# Patient Record
Sex: Female | Born: 1970 | Race: White | Hispanic: No | Marital: Married | Smoking: Never smoker
Health system: Southern US, Community
[De-identification: ages and names within clinical notes are randomized; demographics above are authoritative.]

## PROBLEM LIST (undated history)

## (undated) DIAGNOSIS — R519 Headache, unspecified: Secondary | ICD-10-CM

## (undated) DIAGNOSIS — Z8719 Personal history of other diseases of the digestive system: Secondary | ICD-10-CM

## (undated) DIAGNOSIS — G4730 Sleep apnea, unspecified: Secondary | ICD-10-CM

## (undated) DIAGNOSIS — K5792 Diverticulitis of intestine, part unspecified, without perforation or abscess without bleeding: Secondary | ICD-10-CM

## (undated) DIAGNOSIS — I471 Supraventricular tachycardia, unspecified: Secondary | ICD-10-CM

## (undated) DIAGNOSIS — K589 Irritable bowel syndrome without diarrhea: Secondary | ICD-10-CM

## (undated) DIAGNOSIS — K219 Gastro-esophageal reflux disease without esophagitis: Secondary | ICD-10-CM

## (undated) DIAGNOSIS — R51 Headache: Secondary | ICD-10-CM

## (undated) DIAGNOSIS — I10 Essential (primary) hypertension: Secondary | ICD-10-CM

## (undated) DIAGNOSIS — I209 Angina pectoris, unspecified: Secondary | ICD-10-CM

## (undated) DIAGNOSIS — C801 Malignant (primary) neoplasm, unspecified: Secondary | ICD-10-CM

## (undated) HISTORY — PX: TUBAL LIGATION: SHX77

## (undated) HISTORY — PX: ABDOMINAL HYSTERECTOMY: SHX81

## (undated) HISTORY — PX: SIGMOID RESECTION / RECTOPEXY: SUR1294

## (undated) HISTORY — PX: LAPAROSCOPIC LYSIS OF ADHESIONS: SHX5905

## (undated) HISTORY — PX: APPENDECTOMY: SHX54

## (undated) HISTORY — PX: SALPINGOOPHORECTOMY: SHX82

---

## 2004-03-31 ENCOUNTER — Ambulatory Visit: Payer: Self-pay | Admitting: Unknown Physician Specialty

## 2004-05-02 ENCOUNTER — Ambulatory Visit: Payer: Self-pay | Admitting: Obstetrics and Gynecology

## 2005-01-11 ENCOUNTER — Ambulatory Visit: Payer: Self-pay | Admitting: Obstetrics and Gynecology

## 2005-02-10 ENCOUNTER — Ambulatory Visit: Payer: Self-pay | Admitting: Psychiatry

## 2005-02-21 ENCOUNTER — Ambulatory Visit: Payer: Self-pay | Admitting: Internal Medicine

## 2005-02-21 IMAGING — CT CT ABD-PELV W/O CM
1 of 2 series · 16 of 32 positions shown, 20 images · non-contrast
Comparison: none

REASON FOR EXAM: Abdominal pain, evaluate for stones
COMMENTS:

[Series 2: stone · axial · 0.67mm/px · z∈[+154,+526]mm · 16 of 139 slices shown, 20 images]
[im 10/139  soft-tissue]
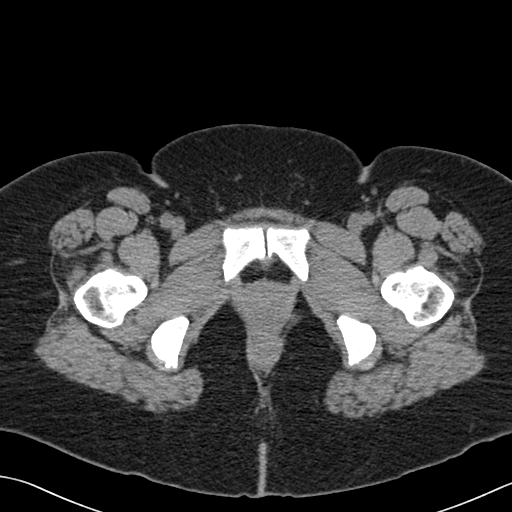
[im 10/139  bone]
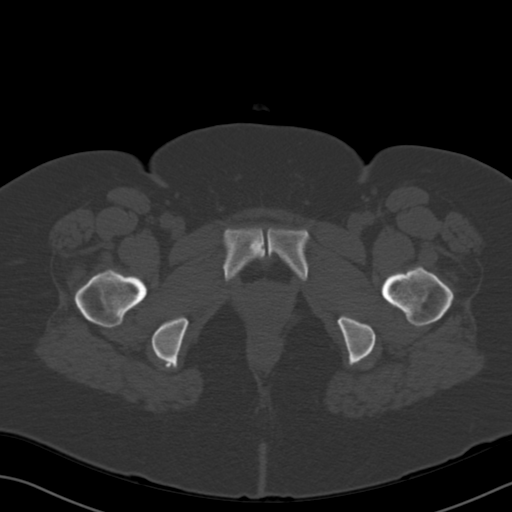
[im 20/139  soft-tissue]
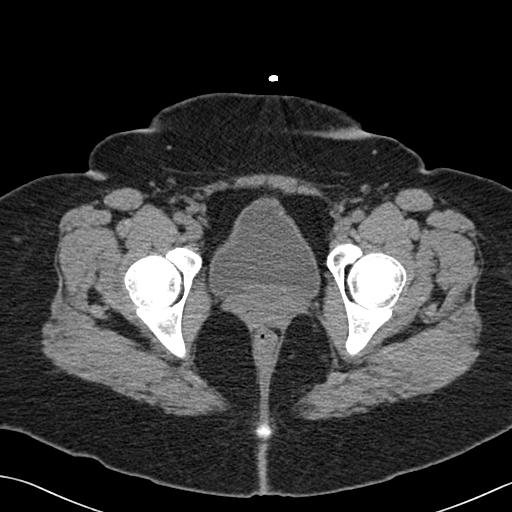
[im 29/139  soft-tissue]
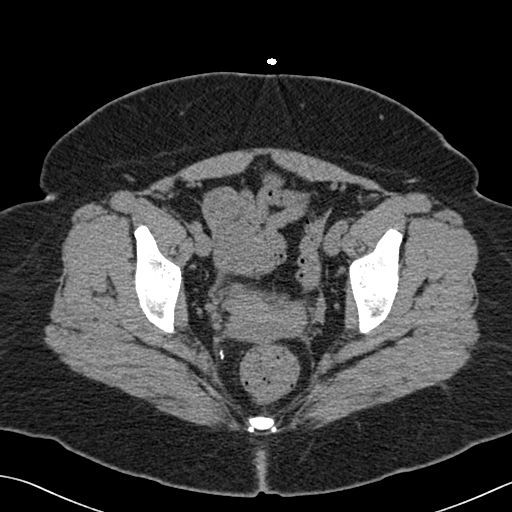
[im 39/139  soft-tissue]
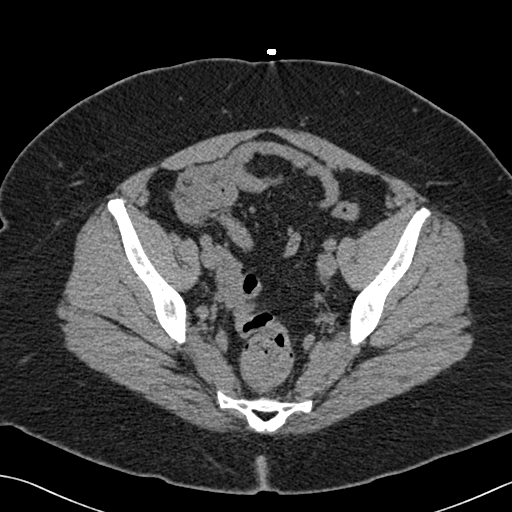
[im 48/139  soft-tissue]
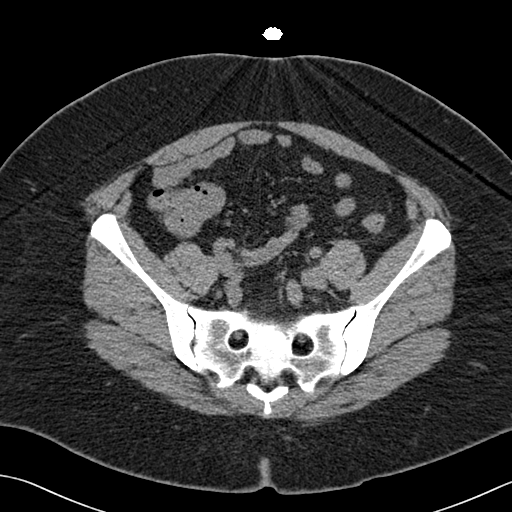
[im 58/139  soft-tissue]
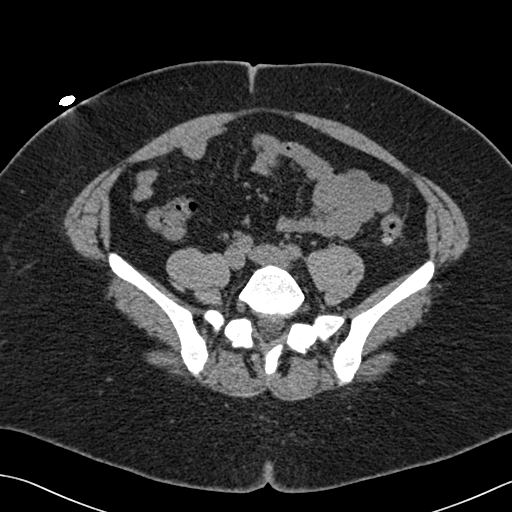
[im 67/139  soft-tissue]
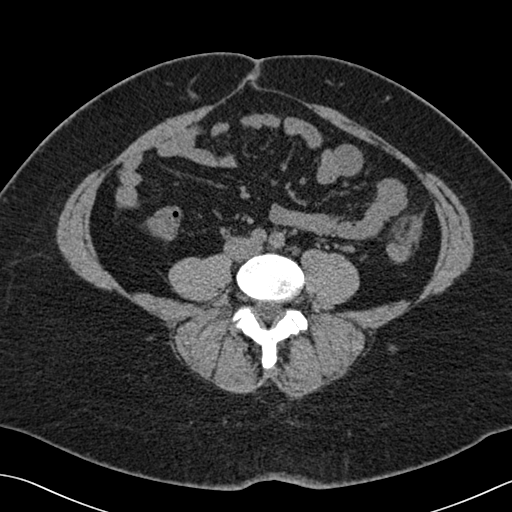
[im 77/139  soft-tissue]
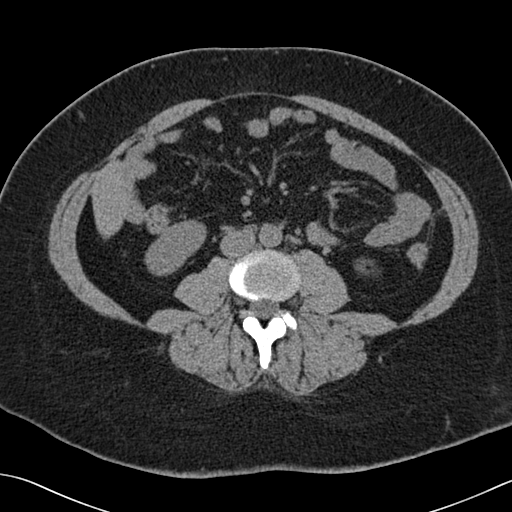
[im 86/139  soft-tissue]
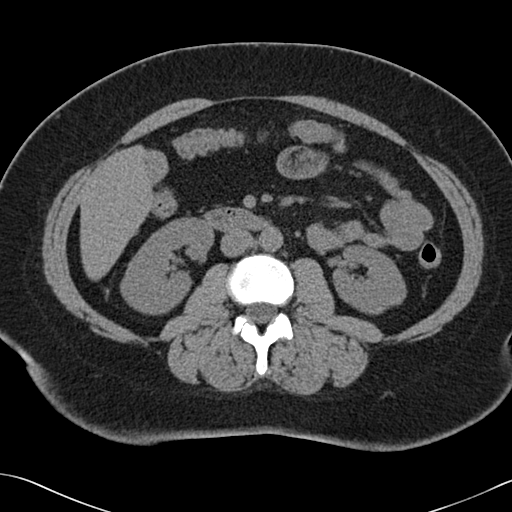
[im 86/139  bone]
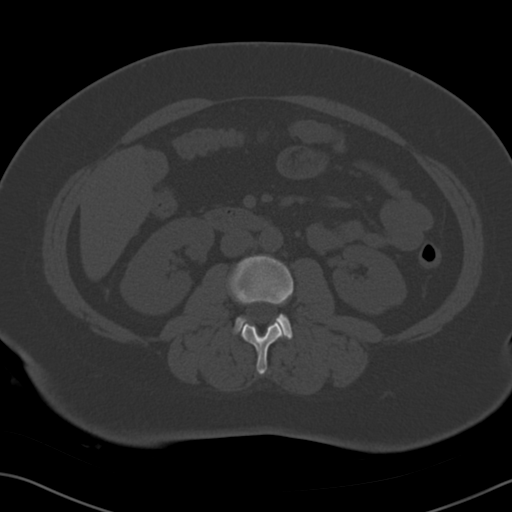
[im 96/139  soft-tissue]
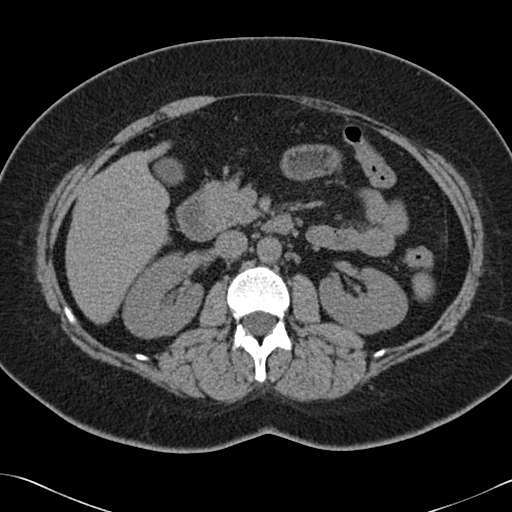
[im 105/139  soft-tissue]
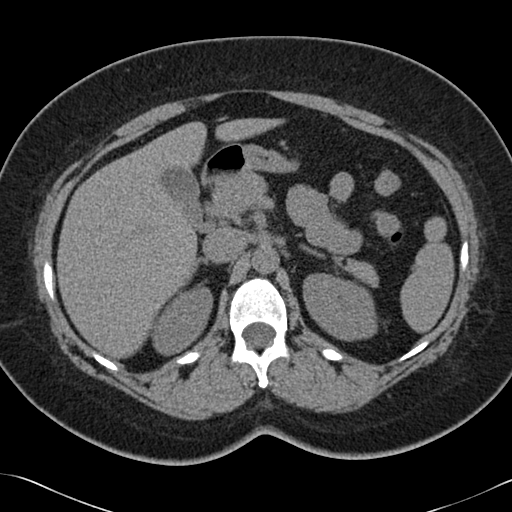
[im 115/139  soft-tissue]
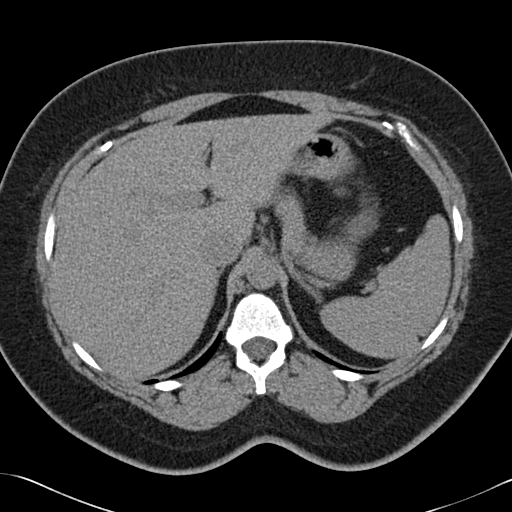
[im 119/139  lung]
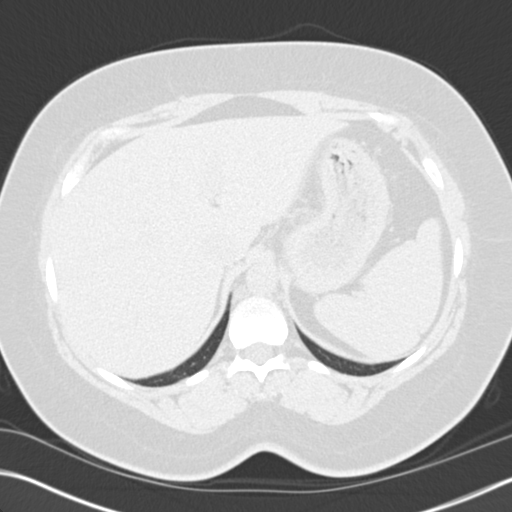
[im 124/139  soft-tissue]
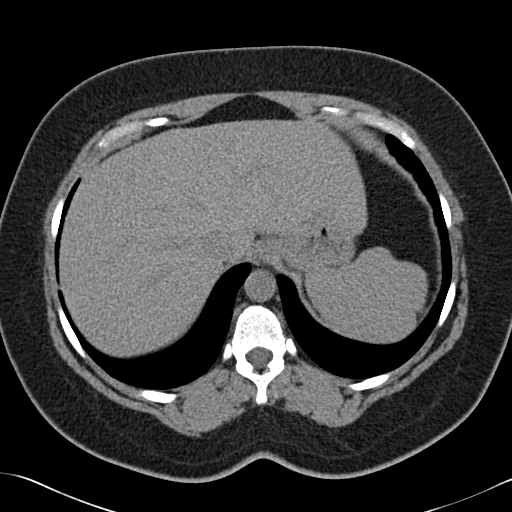
[im 124/139  lung]
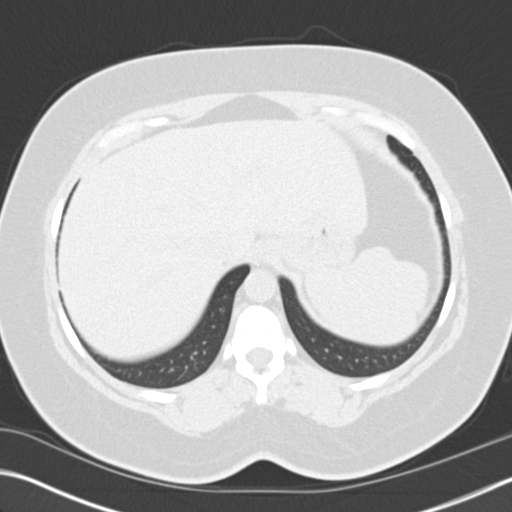
[im 129/139  lung]
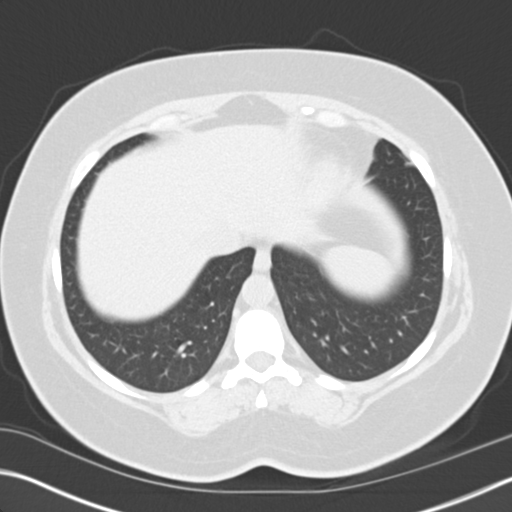
[im 134/139  soft-tissue]
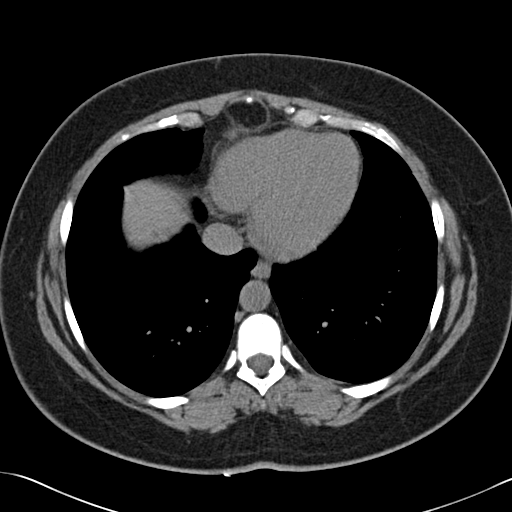
[im 134/139  lung]
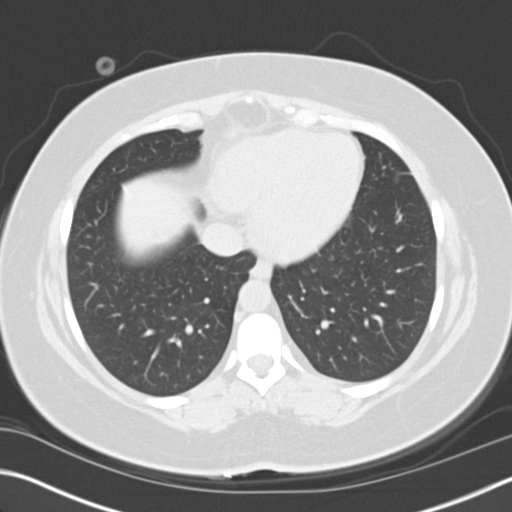

[16 of 32 positions shown; findings below may reference images not displayed]

PROCEDURE:     CT  - CT ABDOMEN AND PELVIS W[DATE]  [DATE]

RESULT:        Noncontrast CT of the abdomen and pelvis is performed in the
standard fashion.  The images demonstrate some inflammatory stranding
adjacent to the descending colon best seen on the area of image 70 to
approximately image 75.  This is suggestive of diverticulitis.  There are
some small scattered diverticula present.  No obstruction or free air is
seen.  There is no abscess.  No urinary tract stones are evident.  No
hydronephrosis is seen. The other abdominal viscera appear to be  grossly
normal.
IMPRESSION: 1.     Subtle changes of diverticulitis involving the descending colon.  No
abscess formation or free air evident.
2.     No urinary tract stone is seen.

## 2005-03-01 ENCOUNTER — Ambulatory Visit: Payer: Self-pay | Admitting: Psychiatry

## 2005-04-25 ENCOUNTER — Ambulatory Visit: Payer: Self-pay | Admitting: Gastroenterology

## 2005-06-19 ENCOUNTER — Ambulatory Visit: Payer: Self-pay | Admitting: Gastroenterology

## 2005-06-19 IMAGING — CT CT ABD-PELV W/ CM
1 of 2 series · 15 of 32 positions shown, 19 images · non-contrast
Comparison: none

REASON FOR EXAM: Abdominal pain, diverticulosis
COMMENTS:

[Series 2: abdomen · axial · 0.71mm/px · z∈[+44,+436]mm · 15 of 55 slices shown, 19 images]
[im 3/55  soft-tissue]
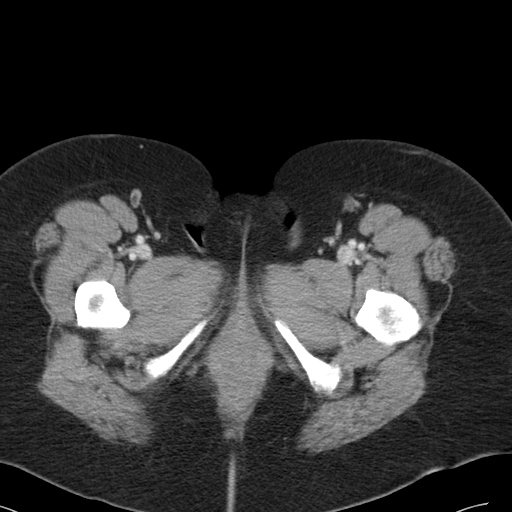
[im 3/55  bone]
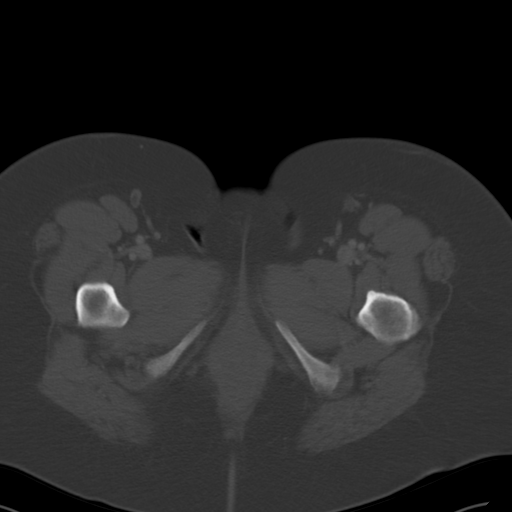
[im 7/55  soft-tissue]
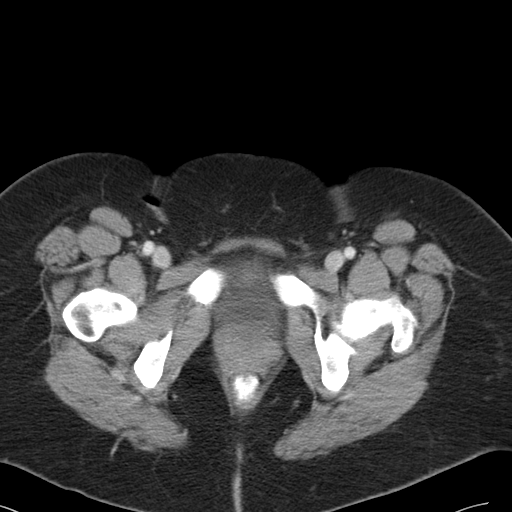
[im 11/55  soft-tissue]
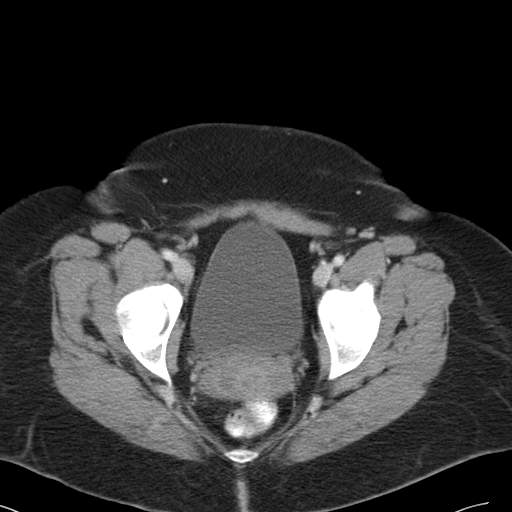
[im 16/55  soft-tissue]
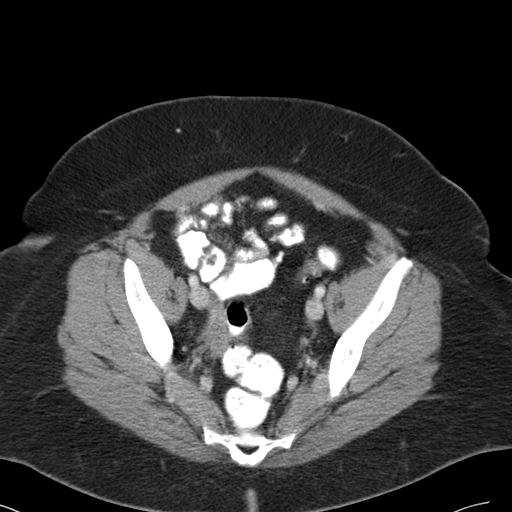
[im 20/55  soft-tissue]
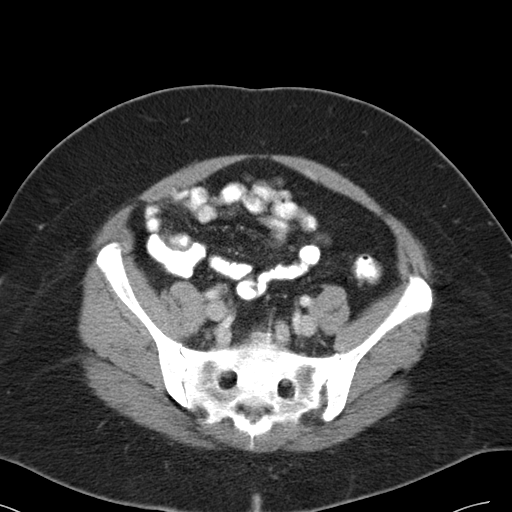
[im 24/55  soft-tissue]
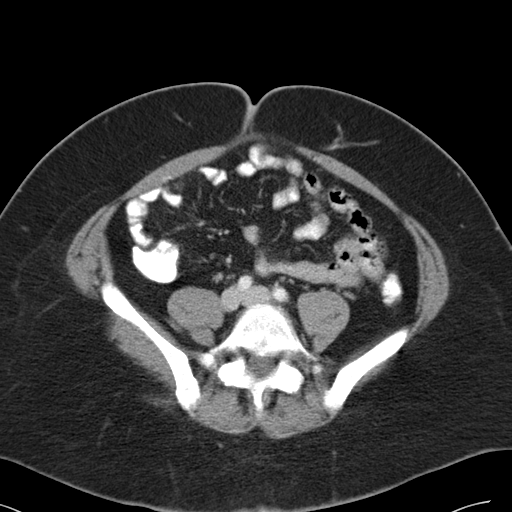
[im 29/55  soft-tissue]
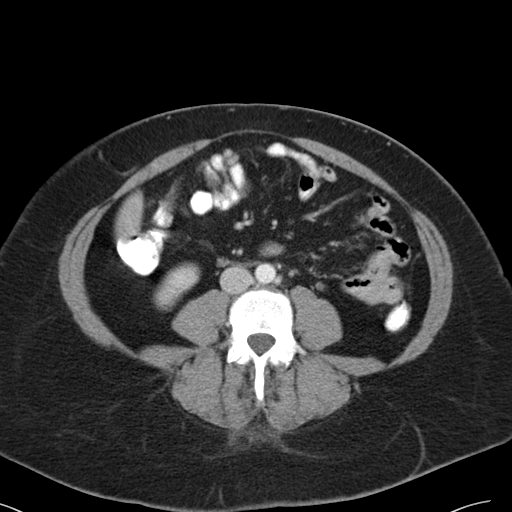
[im 31/55  soft-tissue]
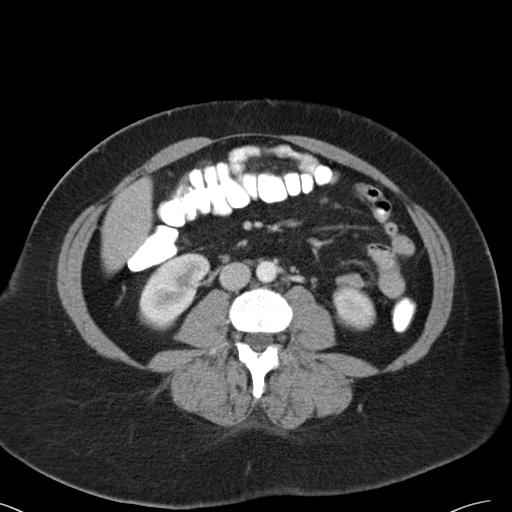
[im 35/55  soft-tissue]
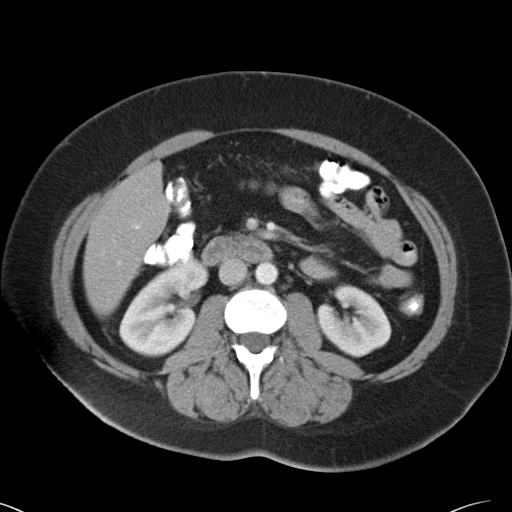
[im 35/55  bone]
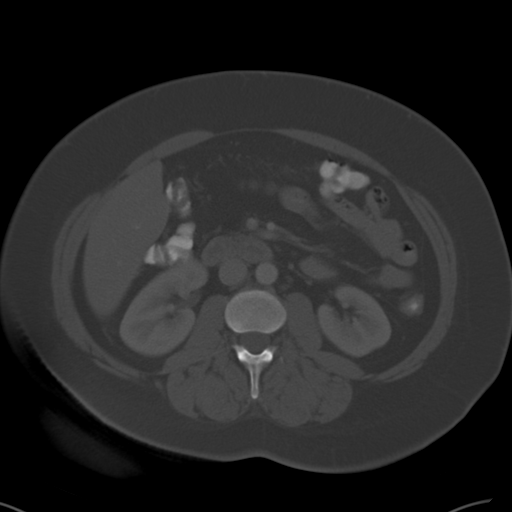
[im 39/55  soft-tissue]
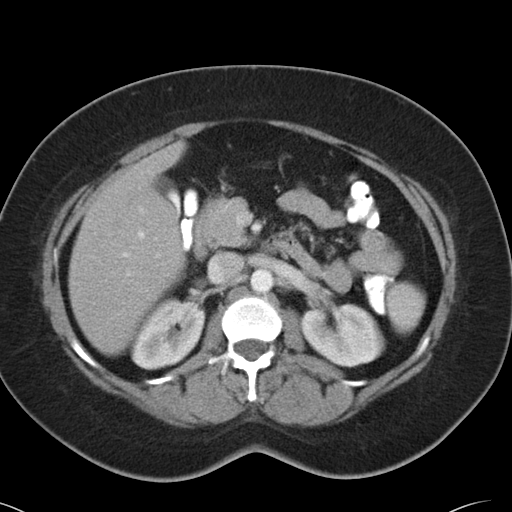
[im 44/55  soft-tissue]
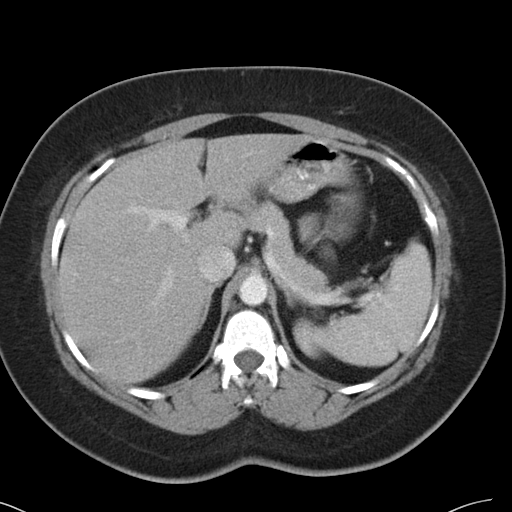
[im 46/55  lung]
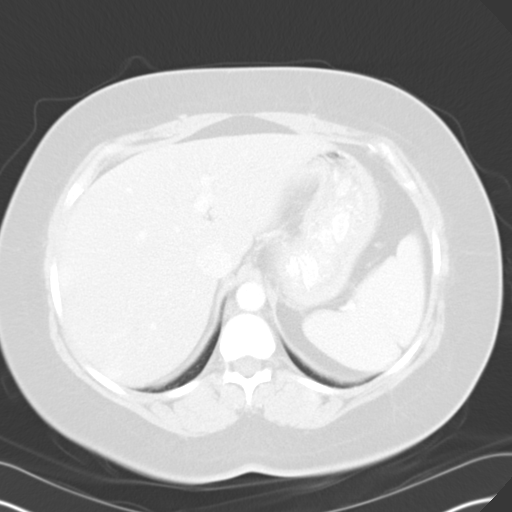
[im 48/55  soft-tissue]
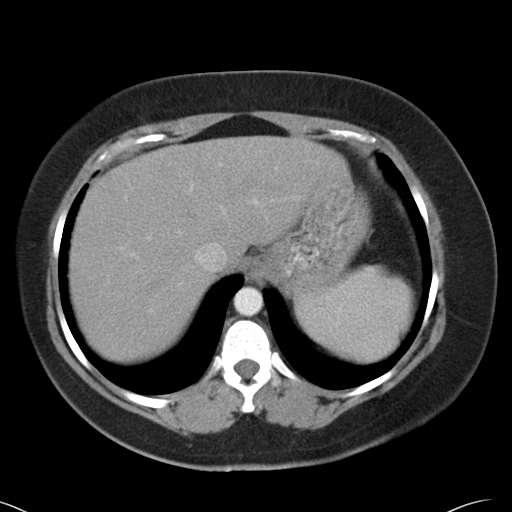
[im 48/55  lung]
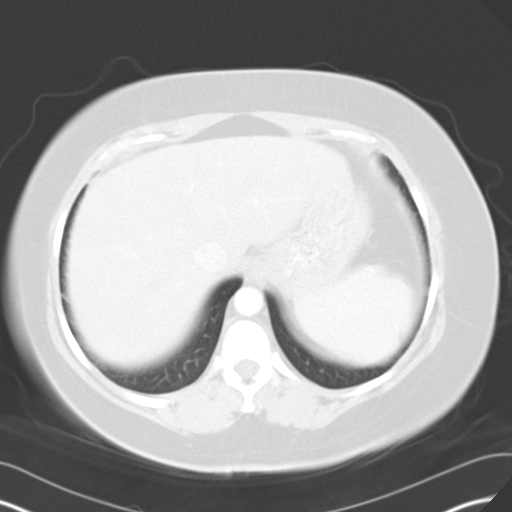
[im 50/55  lung]
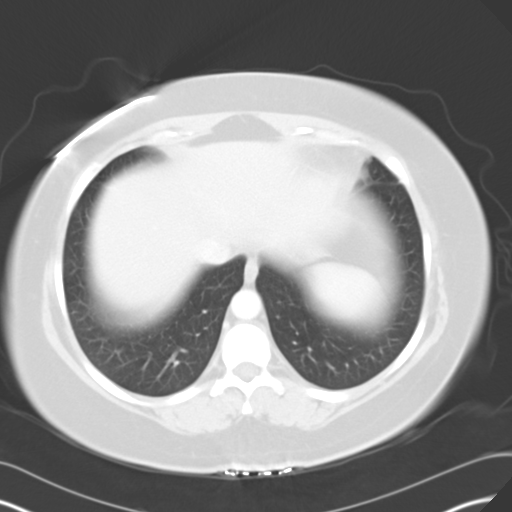
[im 52/55  soft-tissue]
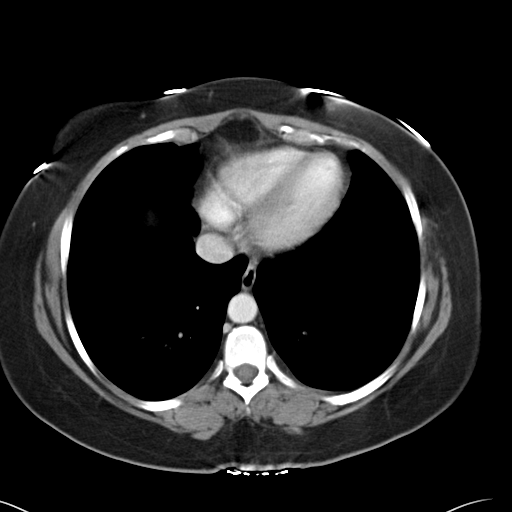
[im 52/55  lung]
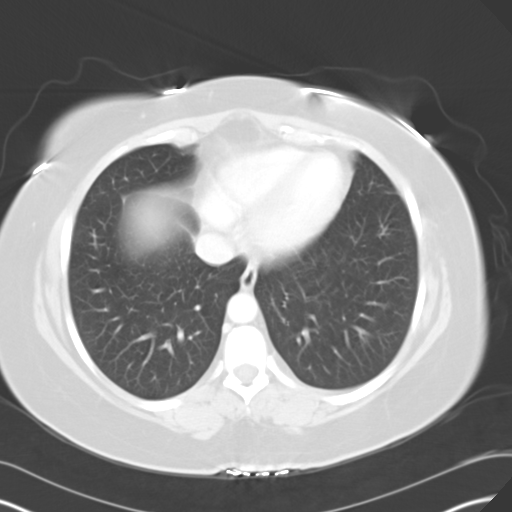

[15 of 32 positions shown; findings below may reference images not displayed]

PROCEDURE:     CT  - CT ABDOMEN / PELVIS  W  - [DATE]  [DATE]

RESULT:     IV and oral contrast-enhanced CT scan of the abdomen and pelvis
was compared to the prior study dated [DATE].

The lung bases appear to be normally aerated.  There is no infiltrate or
effusion.  There is no pneumothorax. There is partial opacification of loops
of bowel.  There are diverticula in the sigmoid colon and LEFT lower
quadrant.  An ovarian cyst appears to be present in the RIGHT adnexa.  The
uterus appears to have been removed and the LEFT ovary is not identified.
No free fluid, free air or inflammatory stranding is seen.  No abnormal
bowel distention is present.  The abdominal aorta is normal in caliber.  The
kidneys demonstrate no evidence of hydronephrosis or focal mass.  The
adrenal glands, liver, spleen, and pancreas appear to be normal.  The
gallbladder is nondistended.
IMPRESSION: Diverticulosis.  The previous changes of diverticulitis in the distal
descending colon appear to have resolved.  No new inflammatory changes are
evident.

Status post hysterectomy and appendectomy.

No evidence of bowel obstruction.

There does appear to a cyst noted on the RIGHT ovary.

## 2005-09-15 ENCOUNTER — Ambulatory Visit: Payer: Self-pay | Admitting: Gastroenterology

## 2006-02-06 ENCOUNTER — Ambulatory Visit: Payer: Self-pay | Admitting: Gastroenterology

## 2006-02-06 IMAGING — NM NUCLEAR MEDICINE HEPATOHBILIARY INCLUDE GB
3 series · 21 of 21 positions shown · non-contrast
Comparison: none

REASON FOR EXAM: Nausea, abdominal pain LLQ
COMMENTS:

[Series 1: gallbladder dynamic (results) · 4.80mm/px · 6 of 60 frames shown]
[frame 6/60]
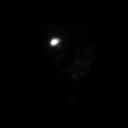
[frame 16/60]
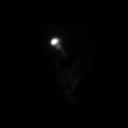
[frame 26/60]
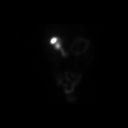
[frame 36/60]
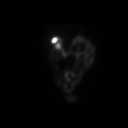
[frame 46/60]
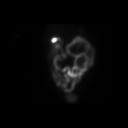
[frame 56/60]
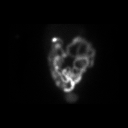

[Series 1: gallbladder statics · 4.80mm/px · 9 of 9 slices shown]
[im 1/9]
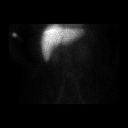
[im 2/9]
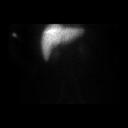
[im 3/9]
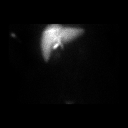
[im 4/9]
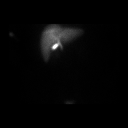
[im 5/9]
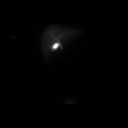
[im 6/9]
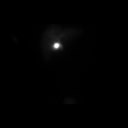
[im 7/9]
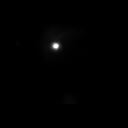
[im 8/9]
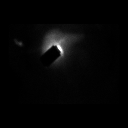
[im 9/9]
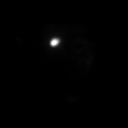

[Series 1: gallbladder dynamic · 4.80mm/px · 6 of 60 frames shown]
[frame 6/60]
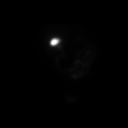
[frame 16/60]
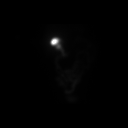
[frame 26/60]
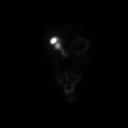
[frame 36/60]
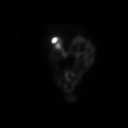
[frame 46/60]
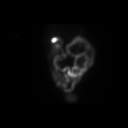
[frame 56/60]
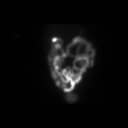

[21 of 21 positions shown; findings below may reference images not displayed]

PROCEDURE:     NM  - NM HEPATO WITH GB EJECT FRACTION  - [DATE] [DATE]

RESULT:          Following injection of 8.52 mCi of [WE] Choletec,
there is noted prompt visualization of tracer activity in the liver at 3
minutes.  At 65 minutes, tracer activity is visualized in the gallbladder,
common duct and proximal small bowel.

The gallbladder ejection fraction at 30 minutes measures 94%, which is in
the normal range.
IMPRESSION: 1.     Normal hepatobiliary scan.
2.     The gallbladder ejection fraction measures 94%, which is in the
normal range.

## 2006-02-06 IMAGING — US ABDOMEN ULTRASOUND
1 series · 17 of 25 positions shown · non-contrast
Comparison: none

REASON FOR EXAM: Nausea, LLQ abdominal pain
COMMENTS:

[Series 1: abdomen ultrasound · 17 of 40 slices shown]
[im 1/40]
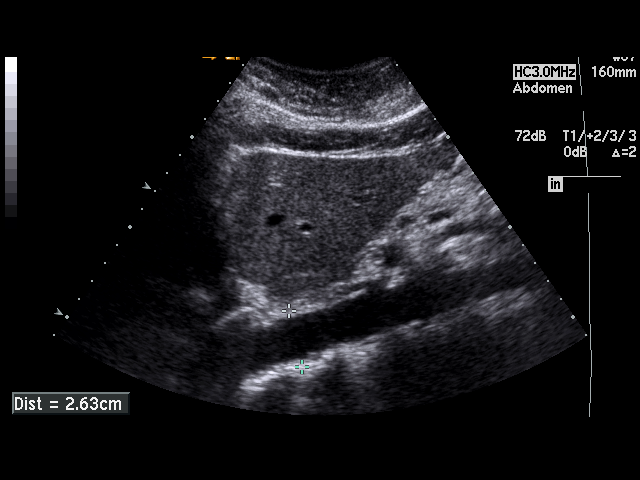
[im 4/40]
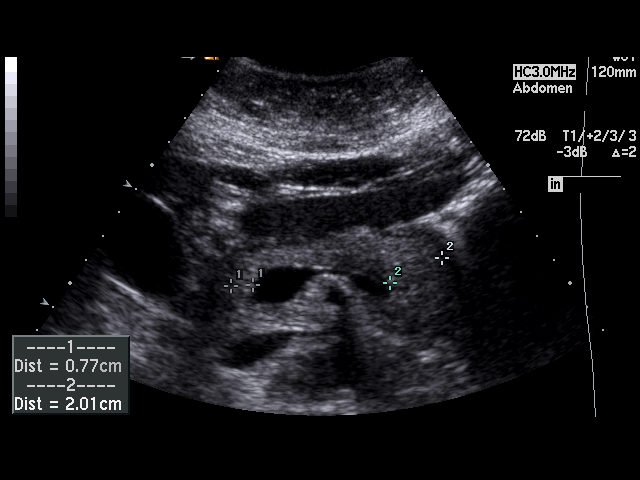
[im 5/40]
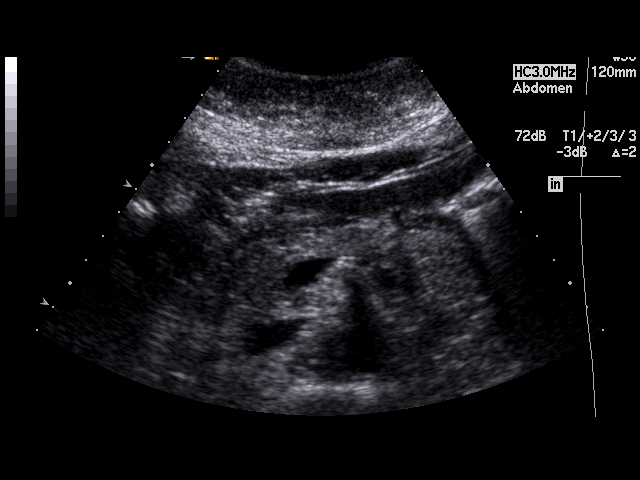
[im 9/40]
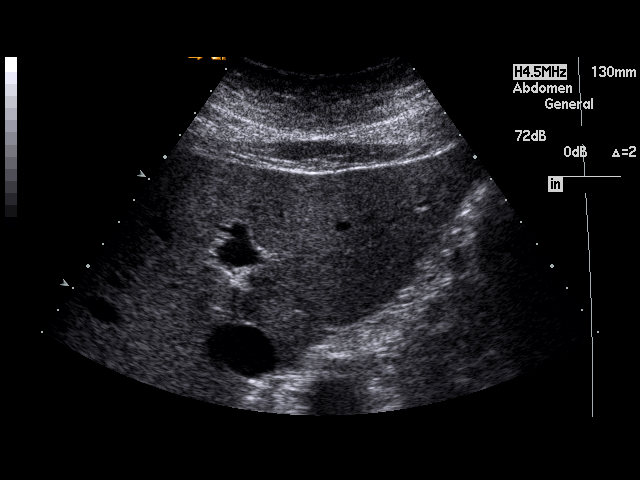
[im 10/40]
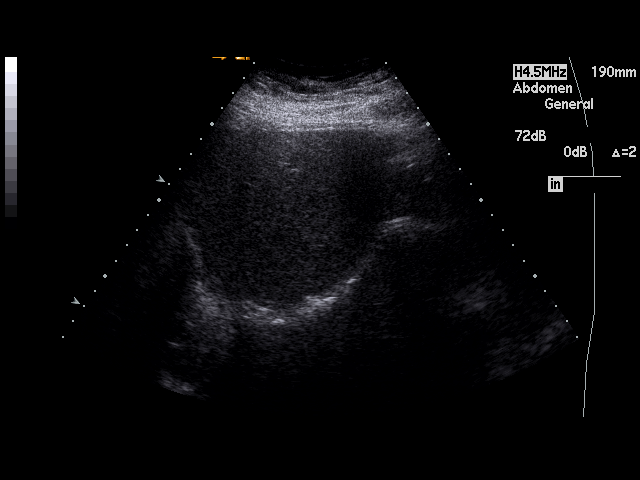
[im 14/40]
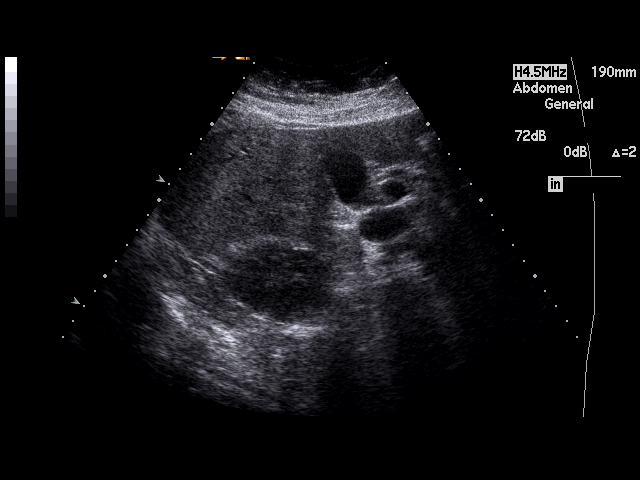
[im 15/40]
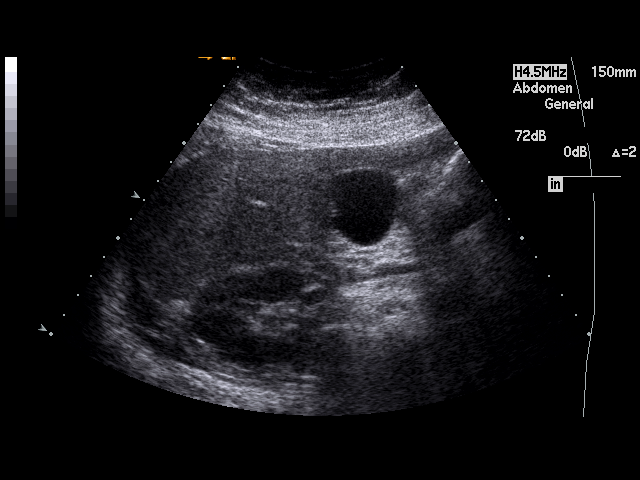
[im 18/40]
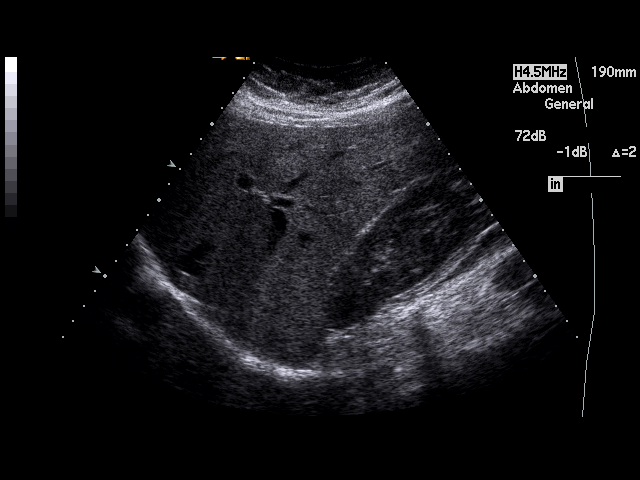
[im 20/40]
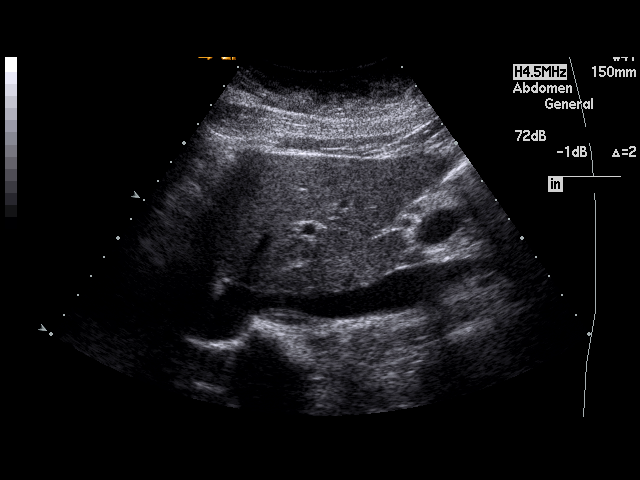
[im 22/40]
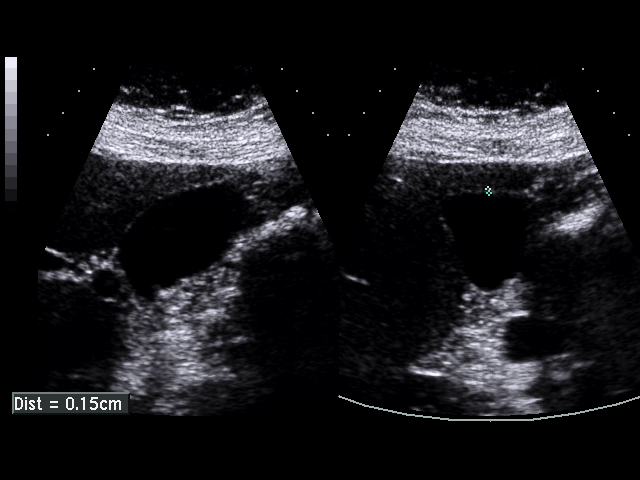
[im 25/40]
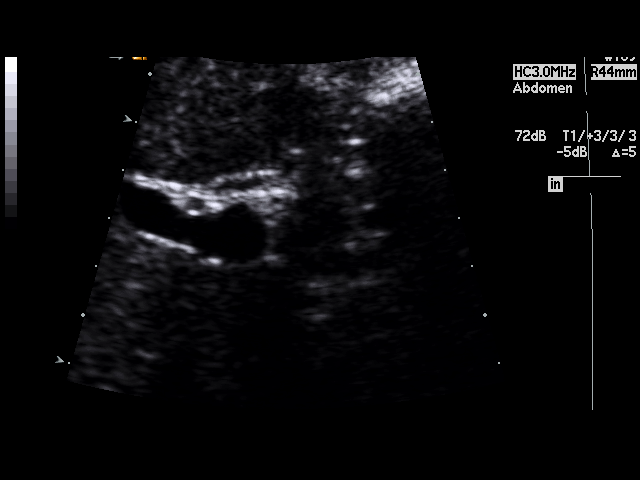
[im 27/40]
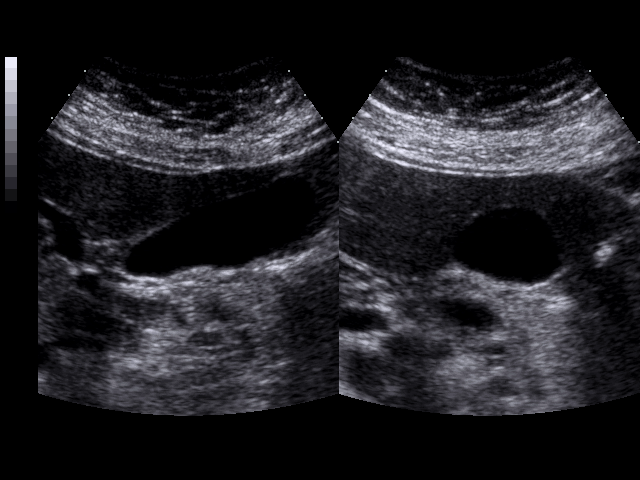
[im 30/40]
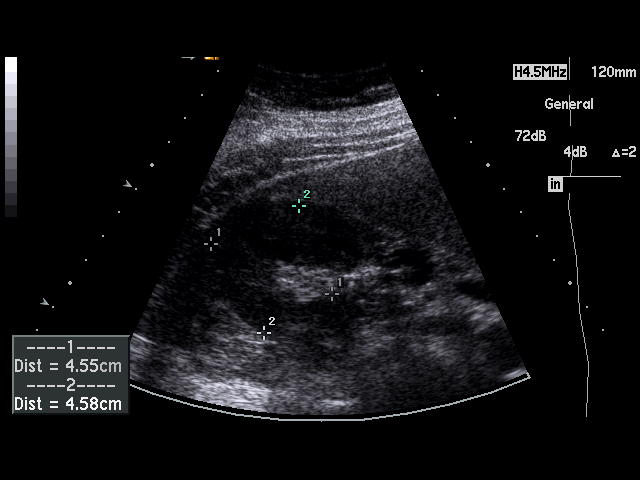
[im 31/40]
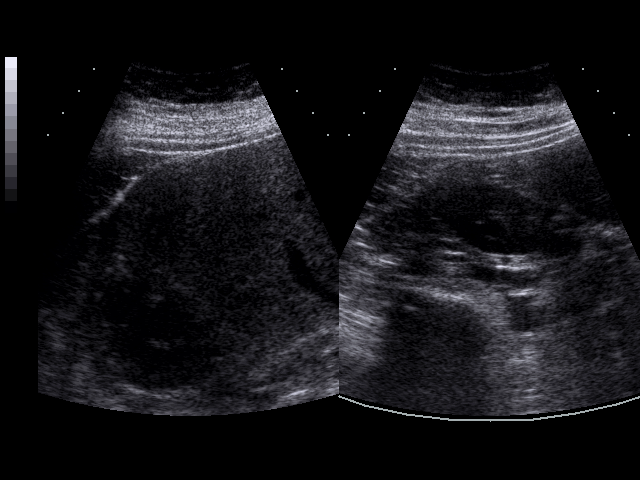
[im 35/40]
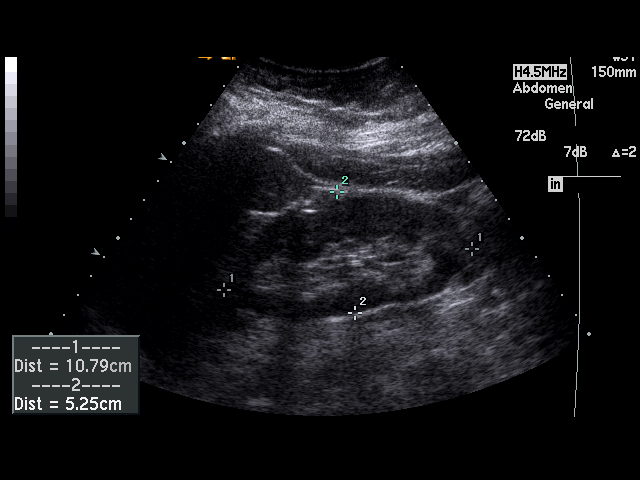
[im 36/40]
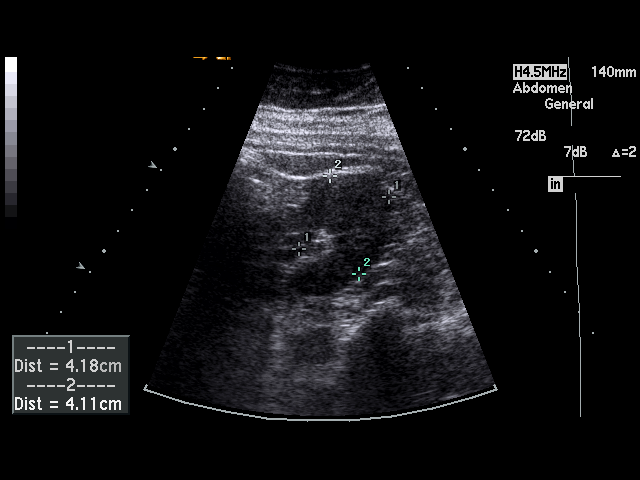
[im 40/40]
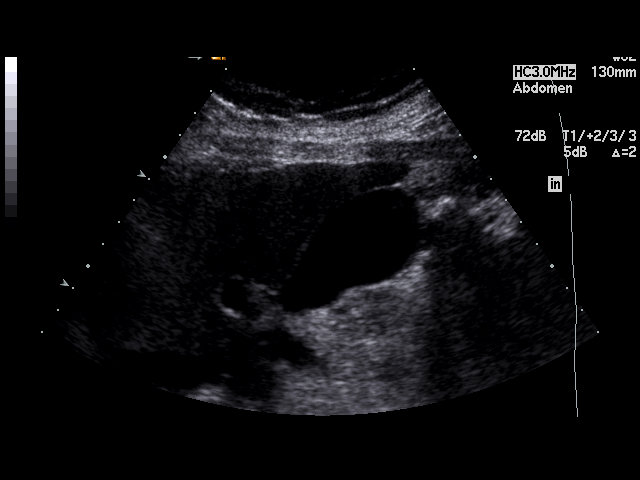

[17 of 25 positions shown; findings below may reference images not displayed]

PROCEDURE:     US  - US ABDOMEN GENERAL SURVEY  - [DATE]  [DATE]

RESULT:     The liver, spleen, pancreas and abdominal aorta are normal in
appearance. No gallstones are seen. There is no thickening of the
gallbladder wall. The common bile duct measures 3.2 mm in diameter which is
within normal limits. The kidneys show no hydronephrosis. There is no
ascites.
IMPRESSION: No significant abnormalities are noted.

## 2006-05-23 ENCOUNTER — Ambulatory Visit: Payer: Self-pay | Admitting: General Surgery

## 2006-05-23 IMAGING — CR DG BE W/ AIR HIGH DENSITY
2 series · 8 of 10 positions shown · non-contrast
Comparison: none

REASON FOR EXAM: Diverticulosis, left lower abdominal pain, evaluate for
diverticulitis
COMMENTS:

[Series 1: view not recorded · 0.17mm/px · 6 of 15 slices shown]
[im 1/15]
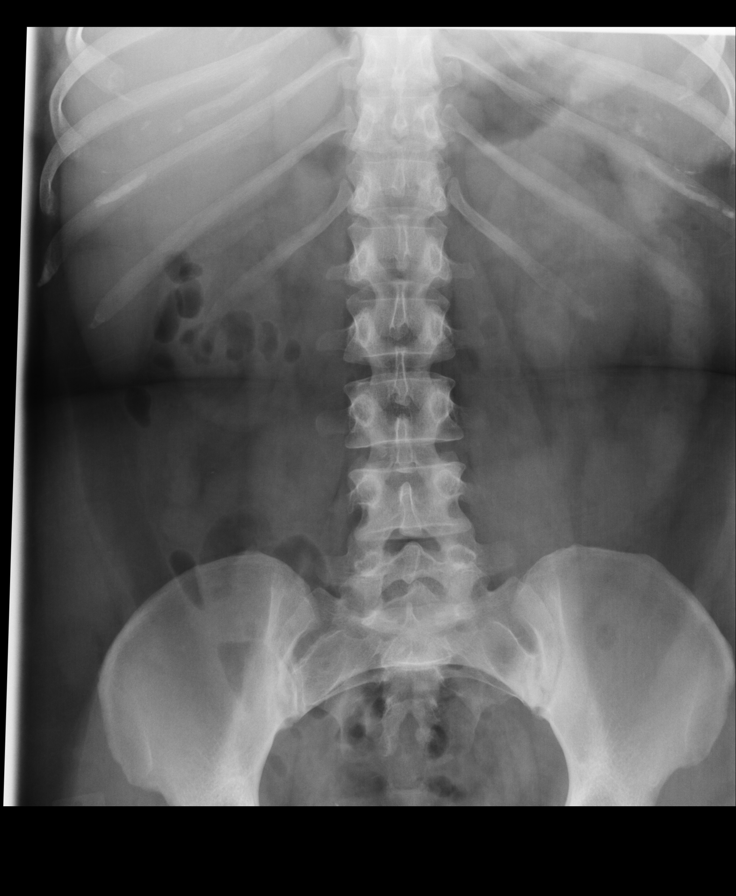
[im 3/15]
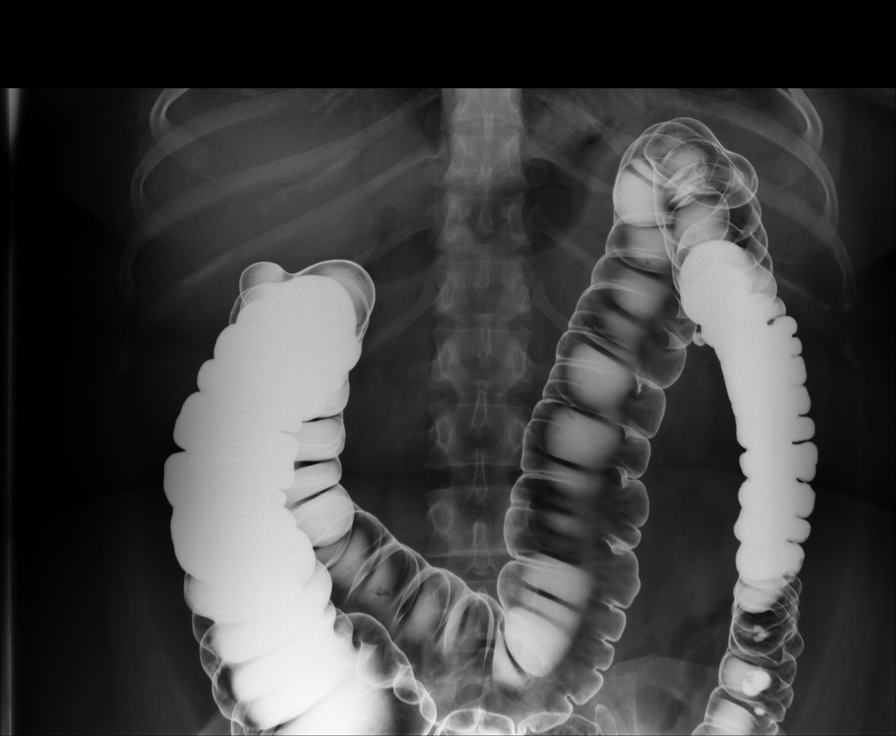
[im 6/15]
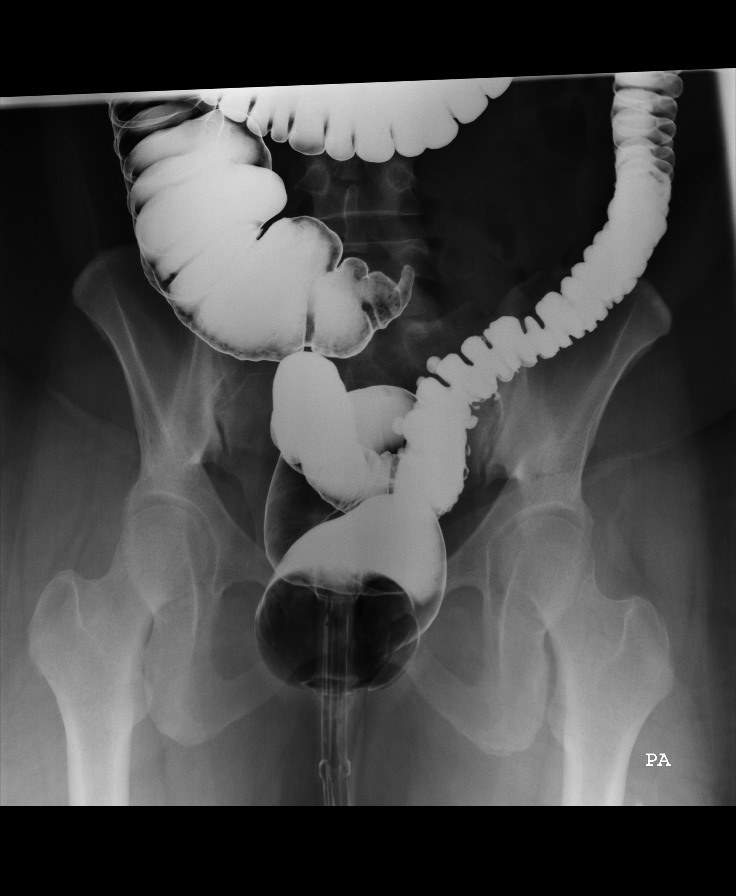
[im 9/15]
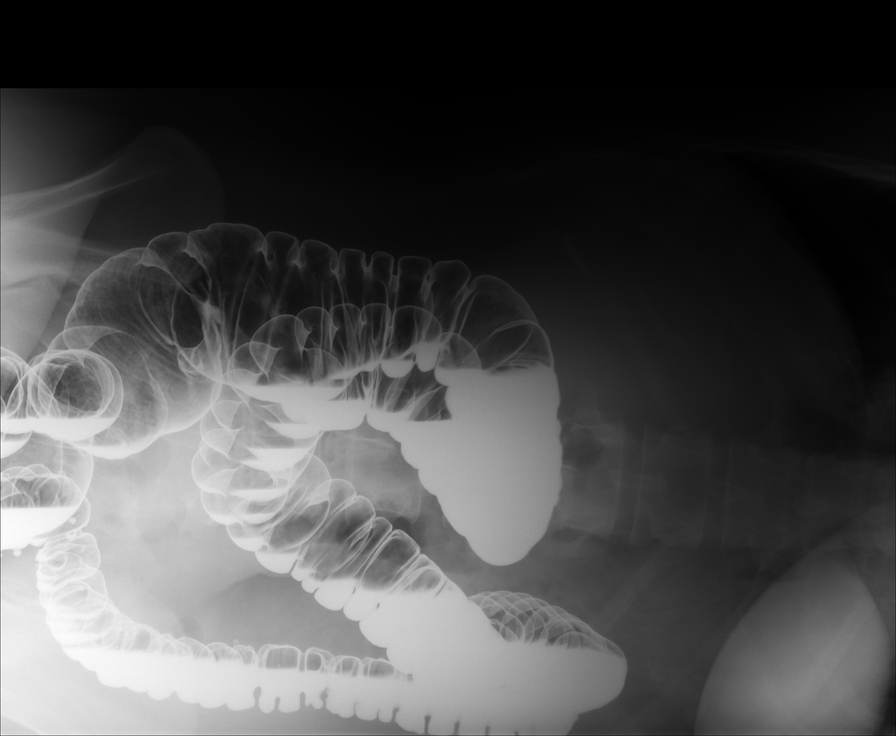
[im 12/15]
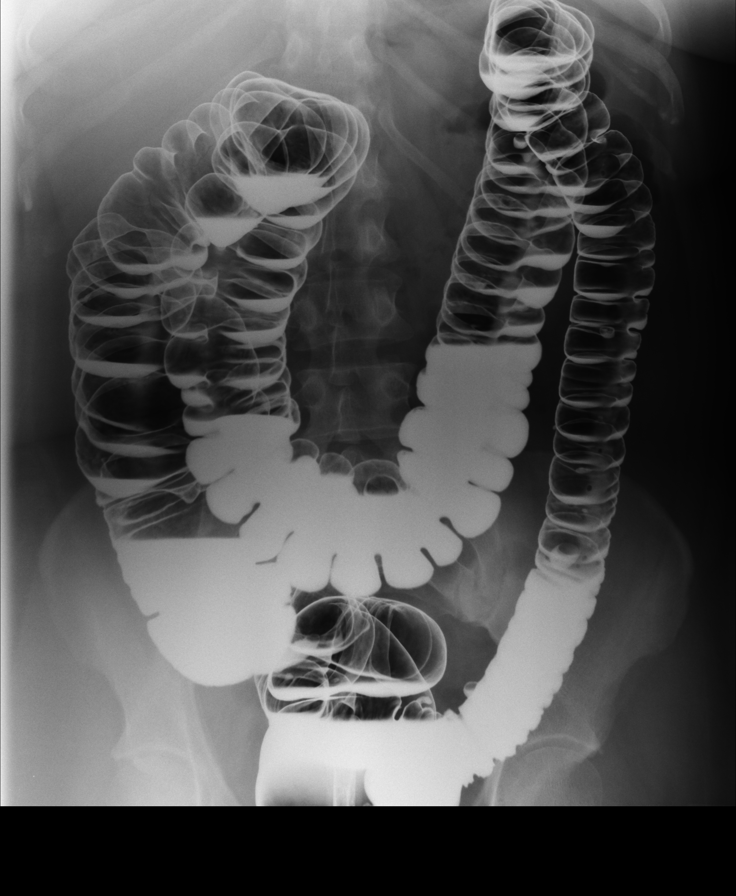
[im 15/15]
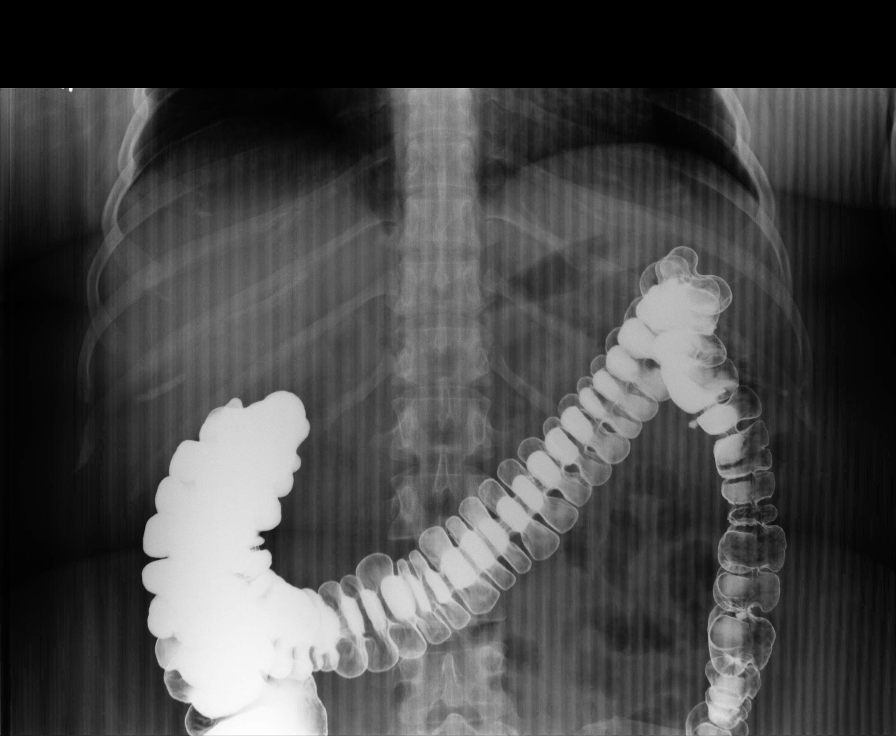

[Series 1: run · 2 of 9 slices shown]
[im 1/9]
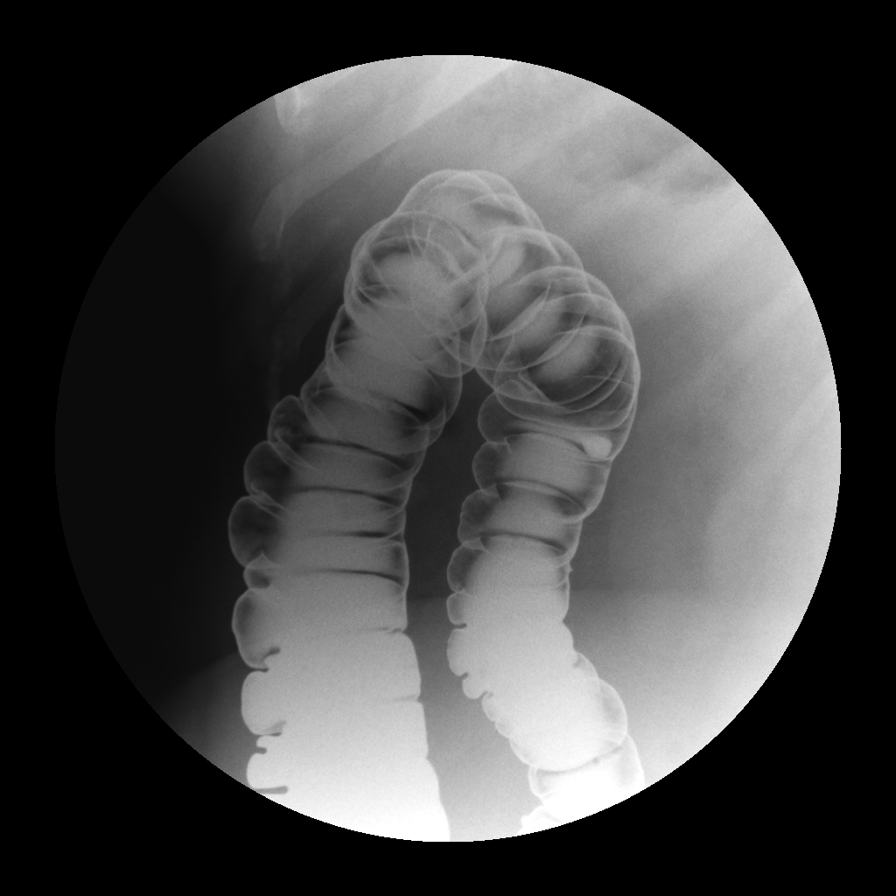
[im 3/9]
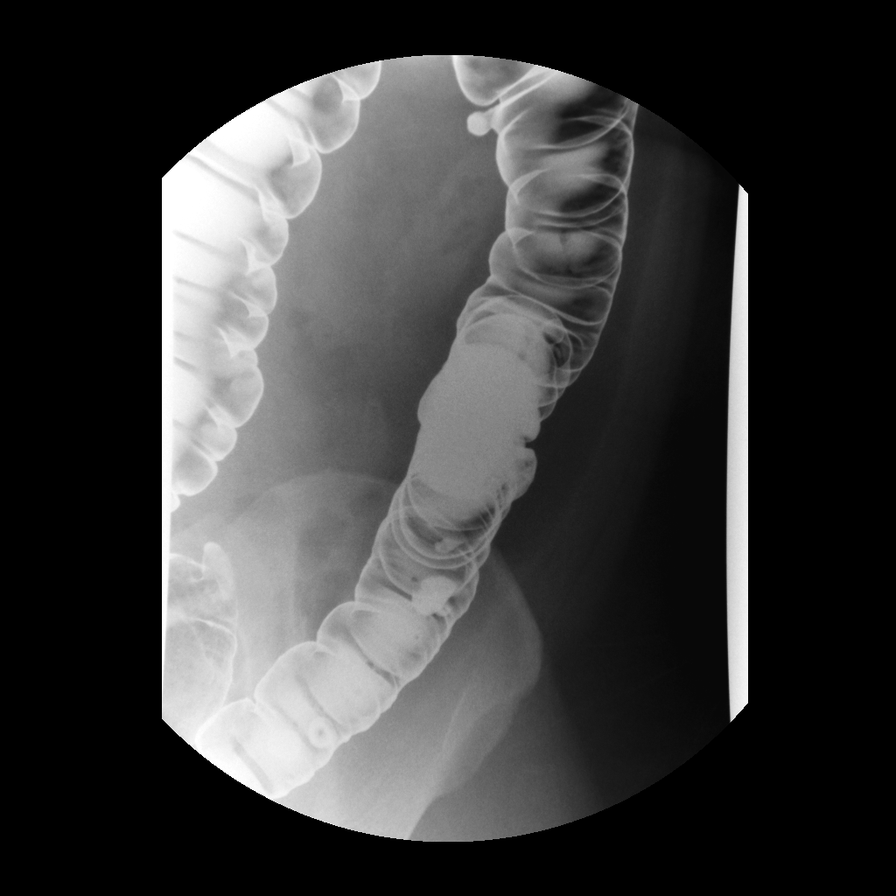

[8 of 10 positions shown; findings below may reference images not displayed]

PROCEDURE:     FL  - FL BARIUM ENEMA WITH AIR COLON  - [DATE] [DATE]

RESULT:     Barium and air were introduced in a retrograde fashion and
flowed freely from rectum to cecum. Multiple diverticula are noted from the
splenic flexure distally and in the descending colon as well as the sigmoid
colon region. There is no definite evidence of diverticulitis. No
constricting lesions are identified. No filling defects are noted to suggest
polyps. Incidentally noted on the upright film, there are a few, scattered,
small diverticula noted in the transverse colon.

There is no spontaneous reflux of barium into the terminal ileum.
IMPRESSION: Diverticulosis without evidence of diverticulitis. No
additional abnormalities are detected.

## 2006-06-01 ENCOUNTER — Inpatient Hospital Stay: Payer: Self-pay | Admitting: General Surgery

## 2006-12-23 ENCOUNTER — Emergency Department: Payer: Self-pay

## 2006-12-23 IMAGING — CT CT HEAD WITHOUT CONTRAST
2 series · 16 of 30 positions shown, 20 images · non-contrast
Comparison: none

REASON FOR EXAM: HA; pt in VRAY
COMMENTS:

[Series 2: without · axial · non-contrast · 0.44mm/px · z∈[-129,-4]mm · 13 of 31 slices shown, 17 images]
[im 3/31  brain]
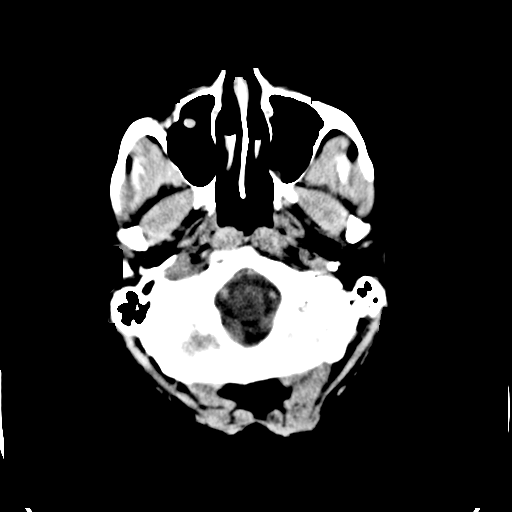
[im 3/31  bone]
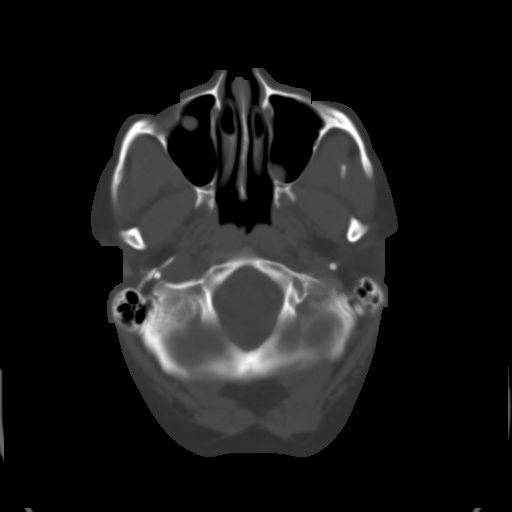
[im 5/31  brain]
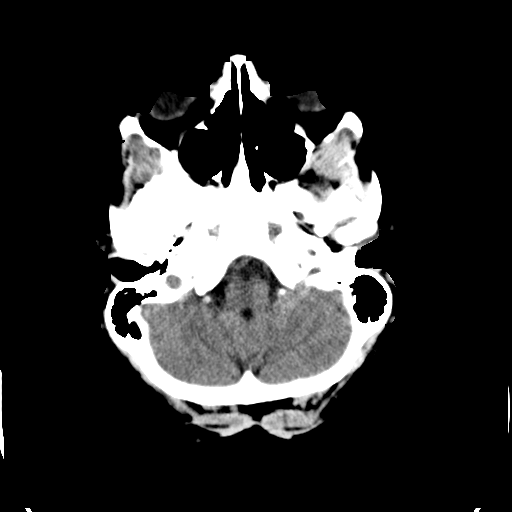
[im 7/31  brain]
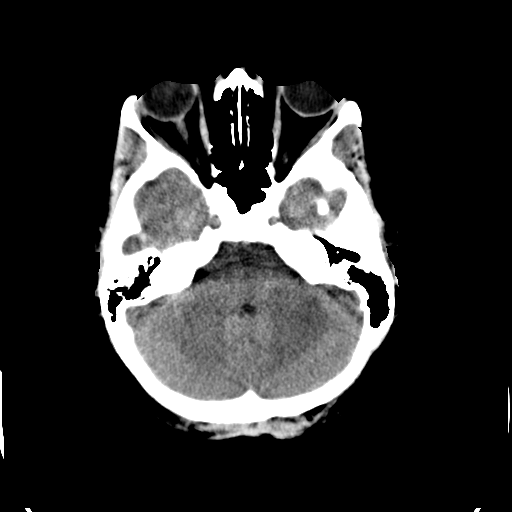
[im 9/31  brain]
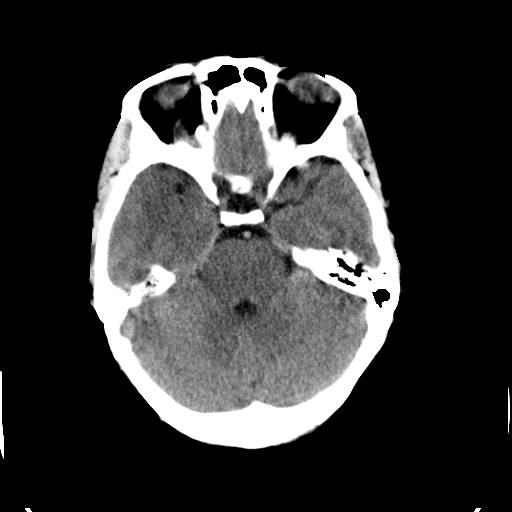
[im 11/31  brain]
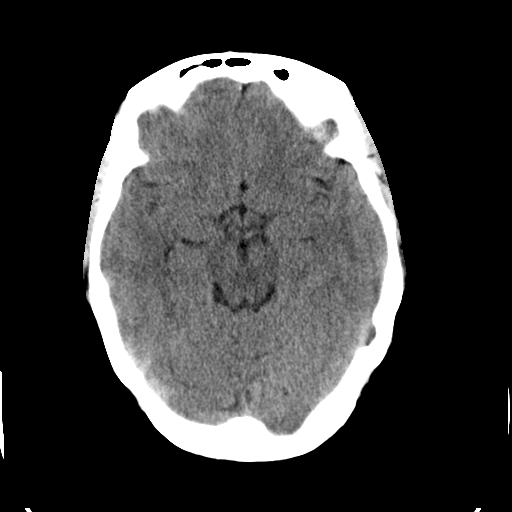
[im 11/31  bone]
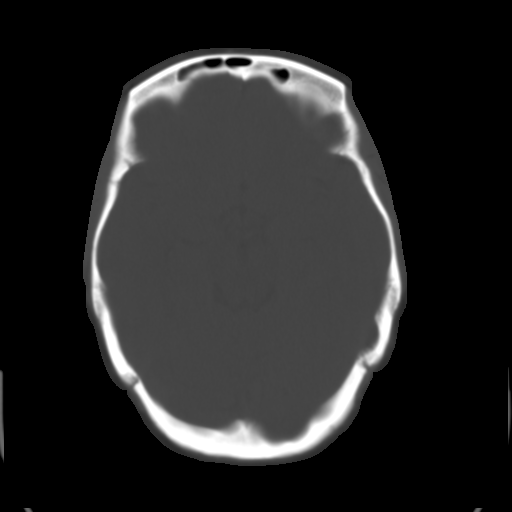
[im 13/31  brain]
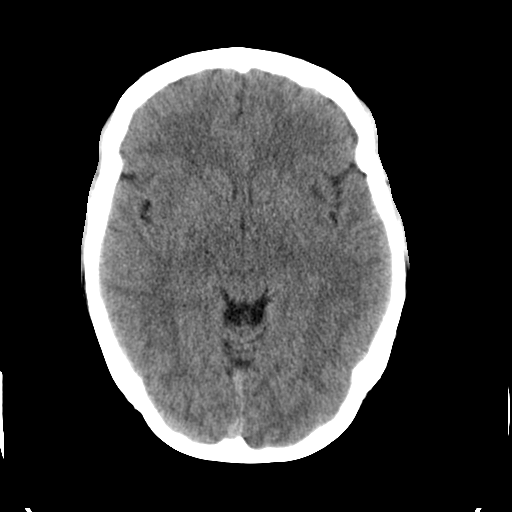
[im 16/31  brain]
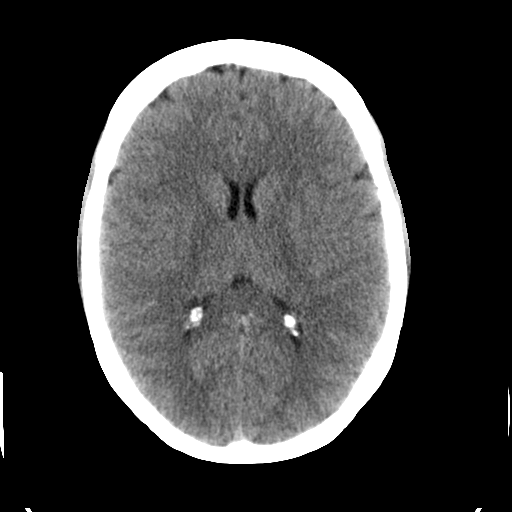
[im 18/31  brain]
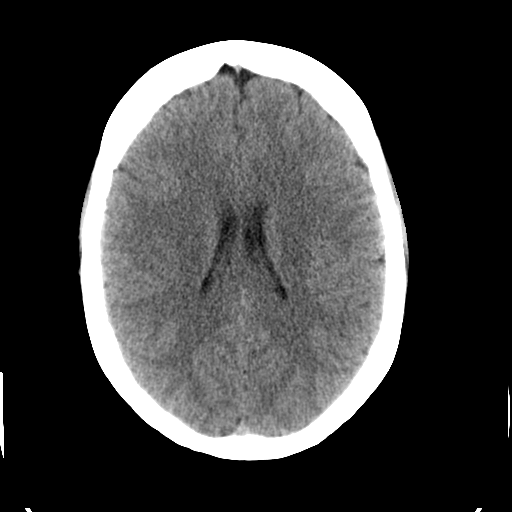
[im 20/31  brain]
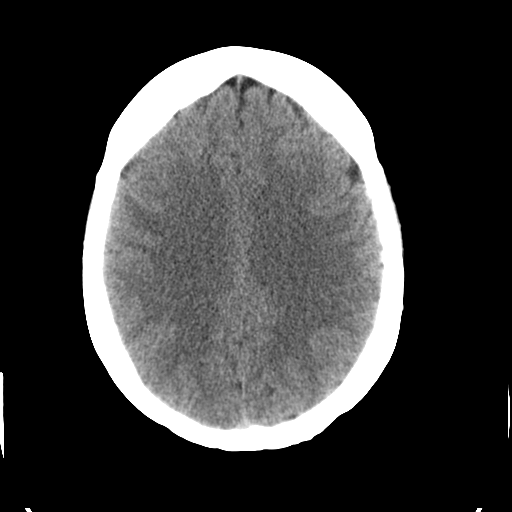
[im 20/31  bone]
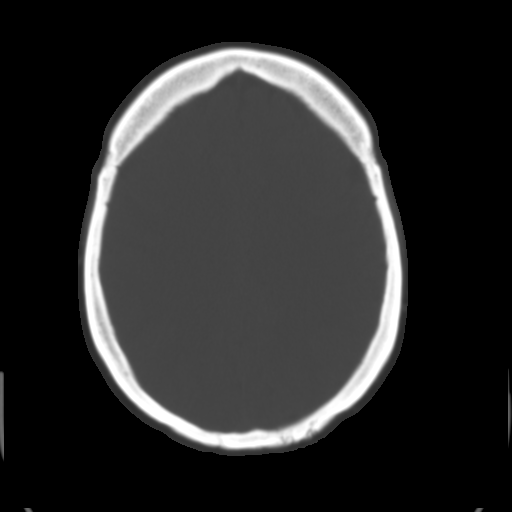
[im 22/31  brain]
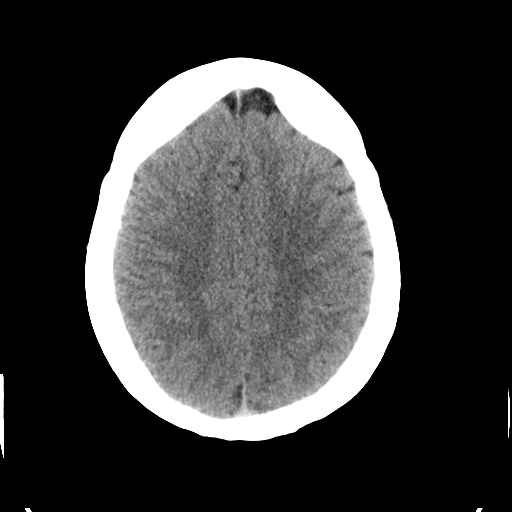
[im 24/31  brain]
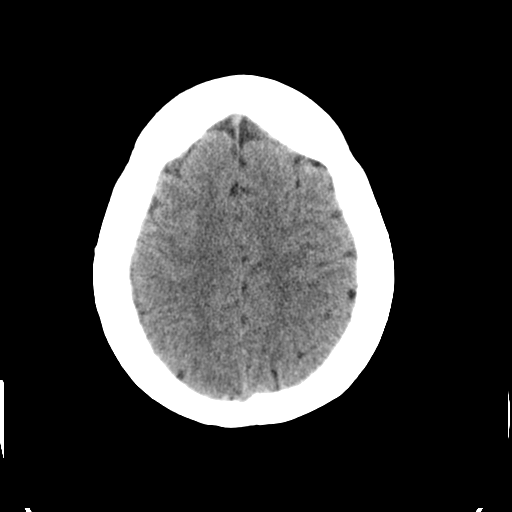
[im 26/31  brain]
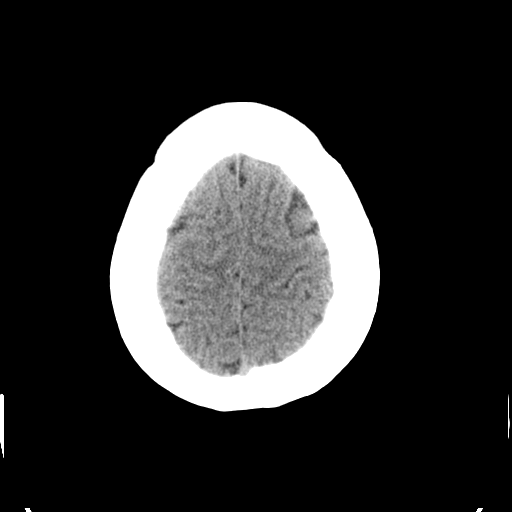
[im 28/31  brain]
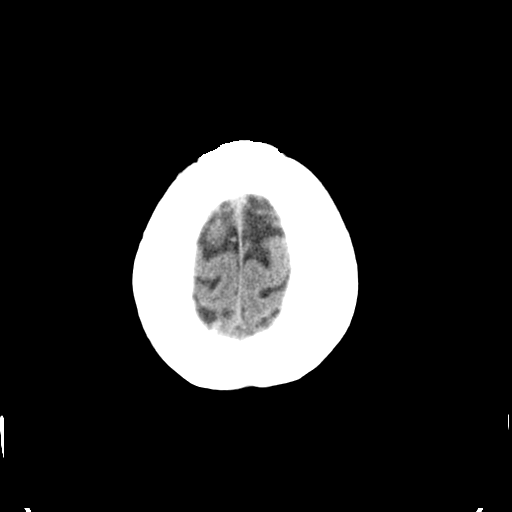
[im 28/31  bone]
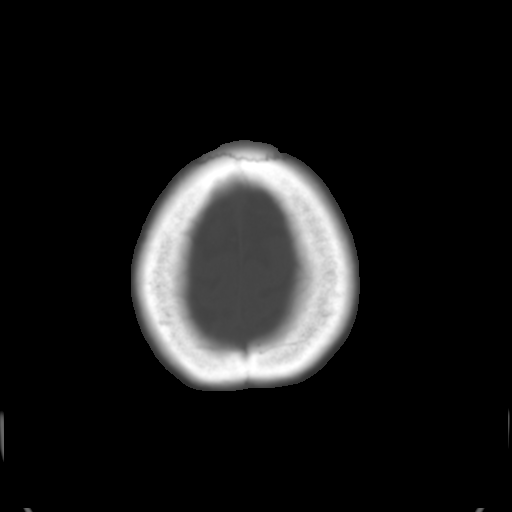

[Series 3: bone · axial · 0.44mm/px · z∈[-129,-89]mm · 3 of 31 slices shown]
[im 3/31  bone]
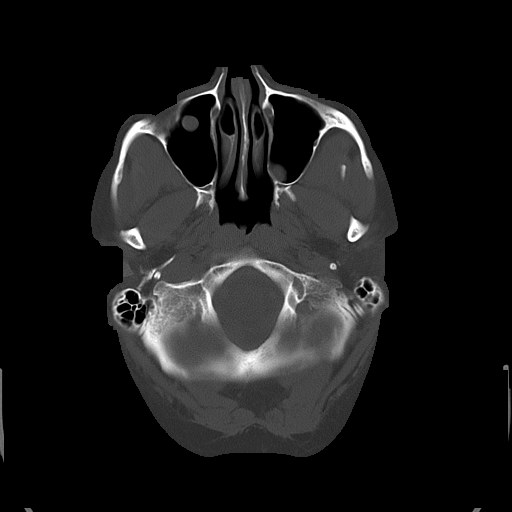
[im 7/31  bone]
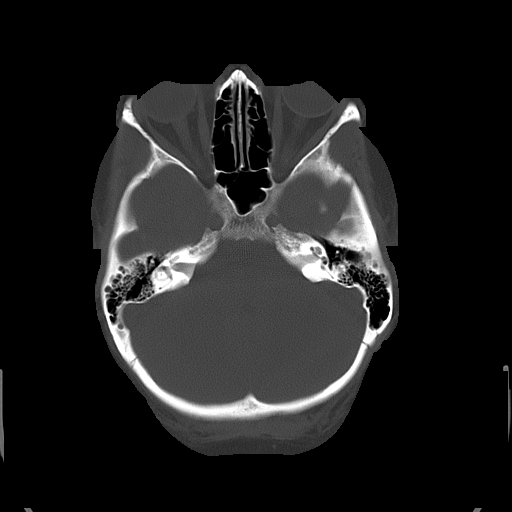
[im 11/31  bone]
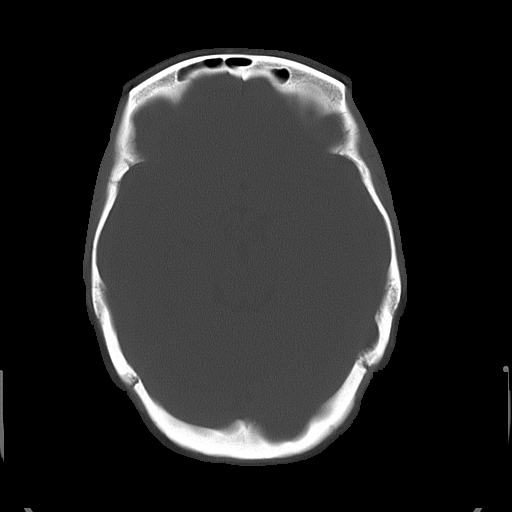

[16 of 30 positions shown; findings below may reference images not displayed]

PROCEDURE:     CT  - CT HEAD WITHOUT CONTRAST  - [DATE]  [DATE]

RESULT:     The ventricles are normal in size and position. There is no
intracranial hemorrhage, mass, or mass-effect. At bone window settings I do
not see evidence of an acute skull fracture. A small amount of
mucoperiosteal thickening is seen inferiorly in both maxillary sinuses. The
mastoid air cells are well pneumatized.
IMPRESSION: 1.There is no evidence of acute abnormality of the brain. Minimal
inflammatory change of the maxillary sinuses is present.

## 2008-01-13 ENCOUNTER — Ambulatory Visit: Payer: Self-pay | Admitting: Internal Medicine

## 2008-01-13 IMAGING — US US EXTREM LOW VENOUS BILAT
1 series · 17 of 24 positions shown · non-contrast
Comparison: none

REASON FOR EXAM: bilateral leg pain   CALL report   [[email or web address]]  ext  [TP]
COMMENTS:

[Series 1: us extrem low venous bilat · 17 of 45 slices shown]
[im 1/45]
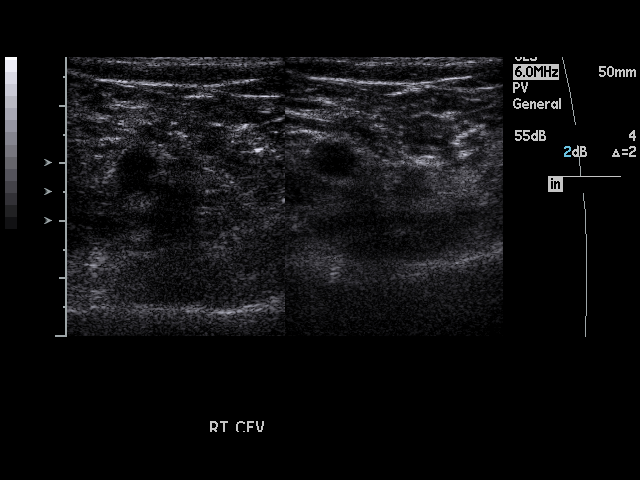
[im 4/45]
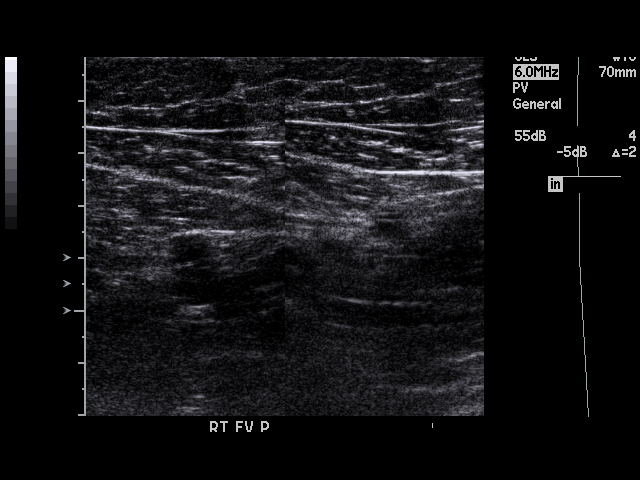
[im 6/45]
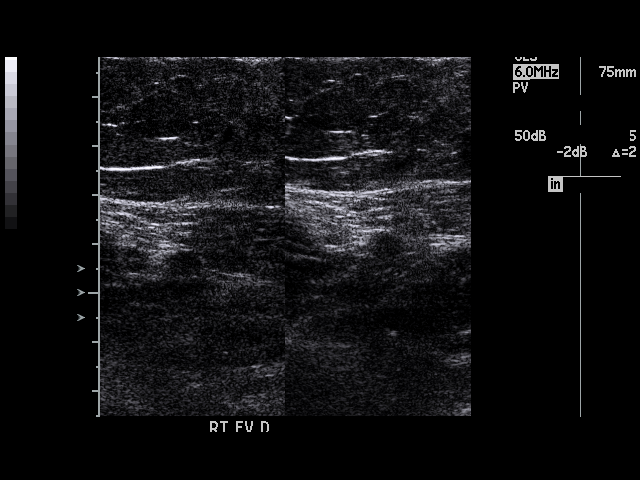
[im 8/45]
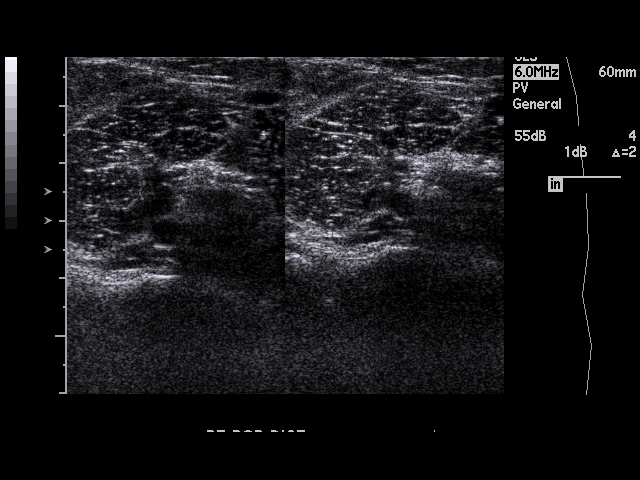
[im 12/45]
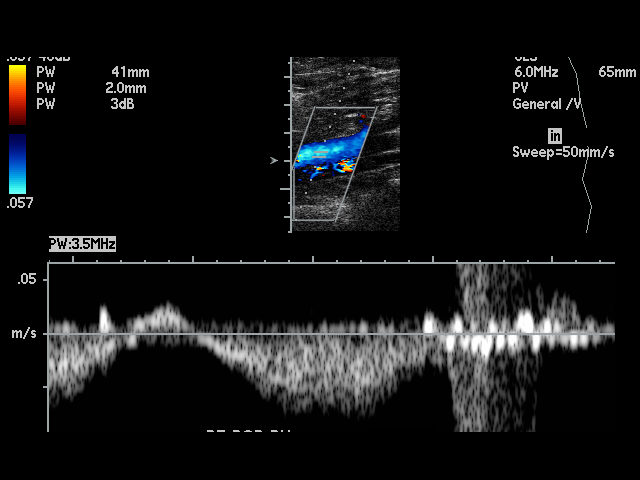
[im 14/45]
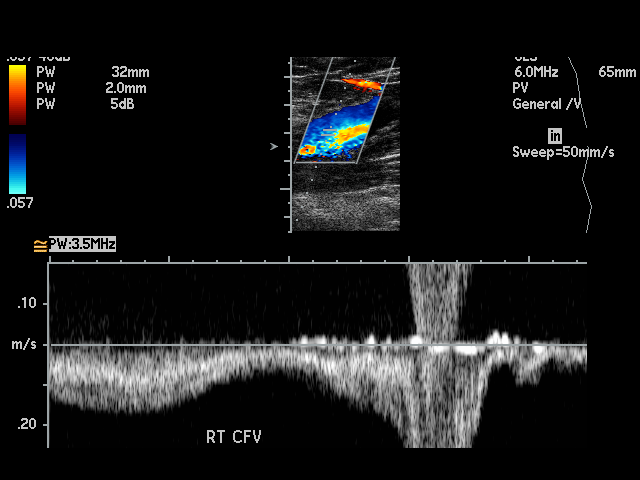
[im 18/45]
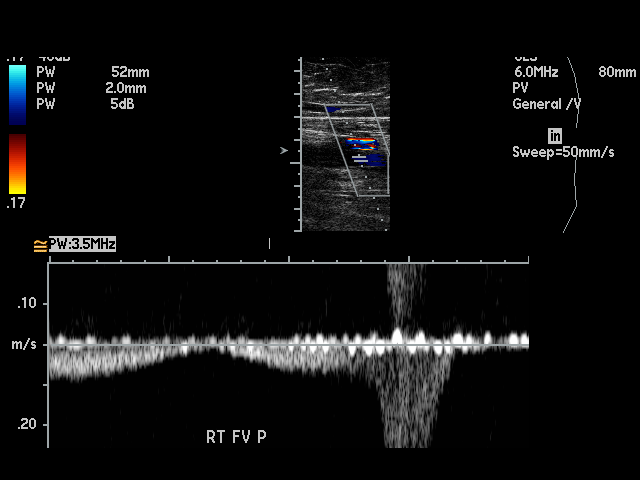
[im 20/45]
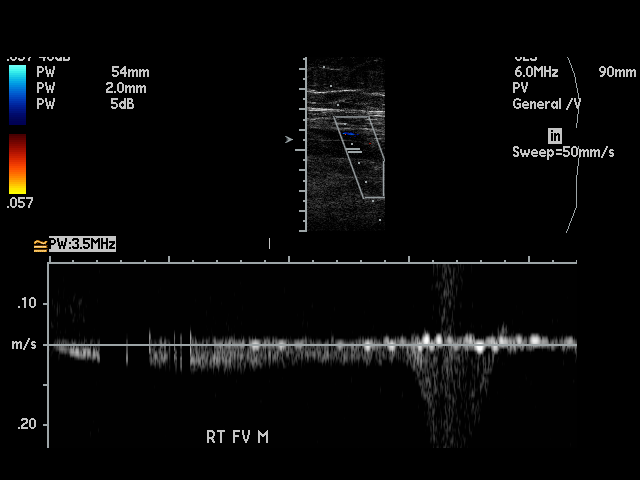
[im 23/45]
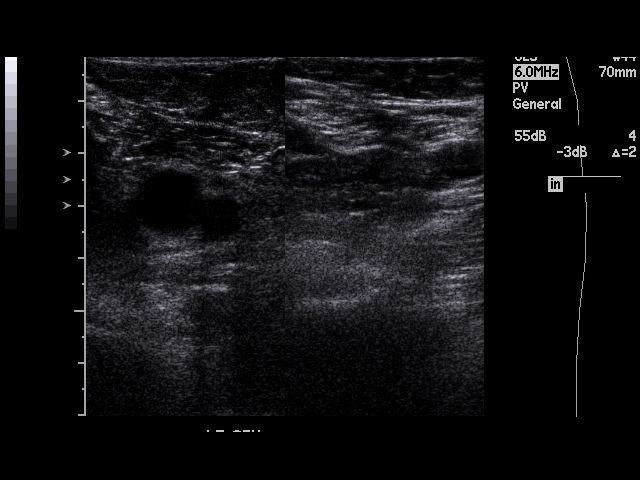
[im 25/45]
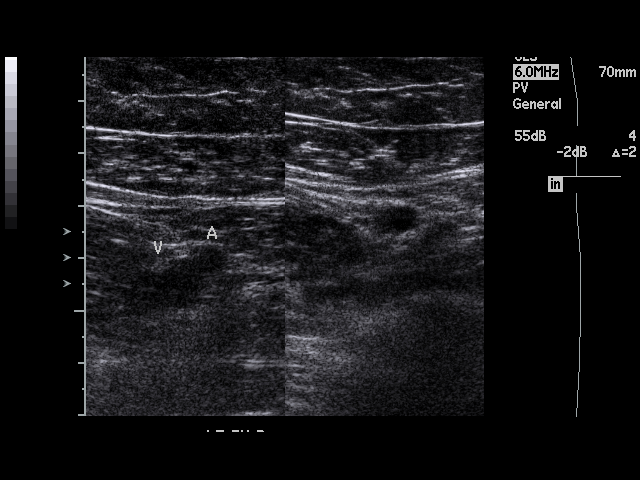
[im 27/45]
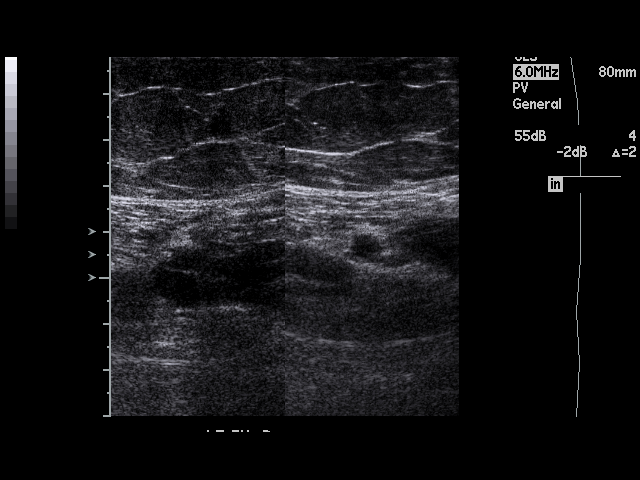
[im 31/45]
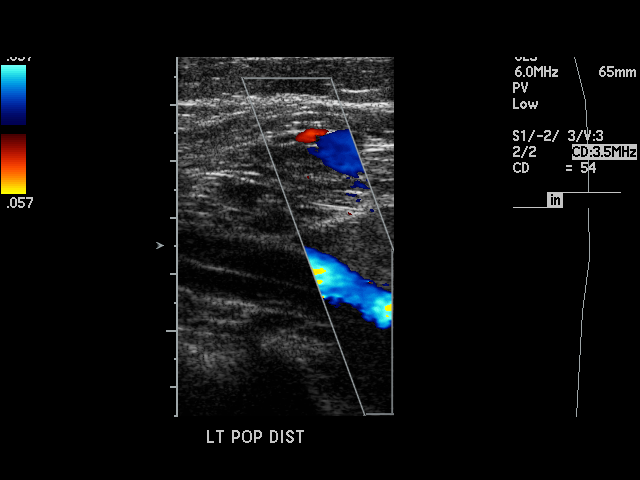
[im 33/45]
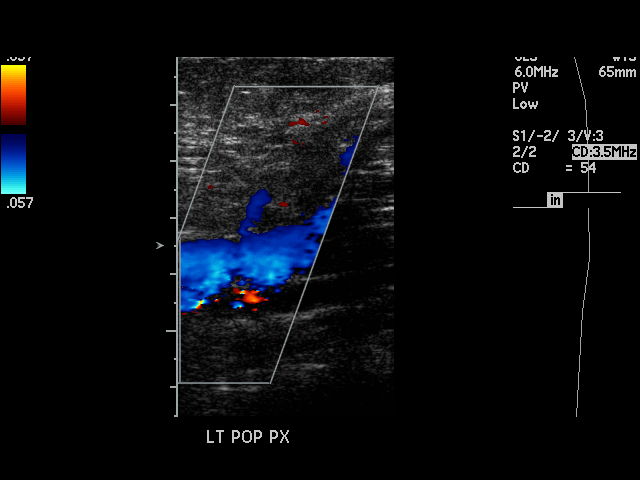
[im 37/45]
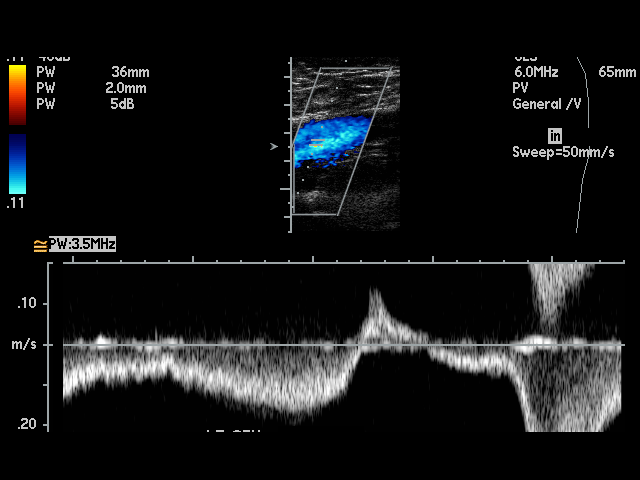
[im 39/45]
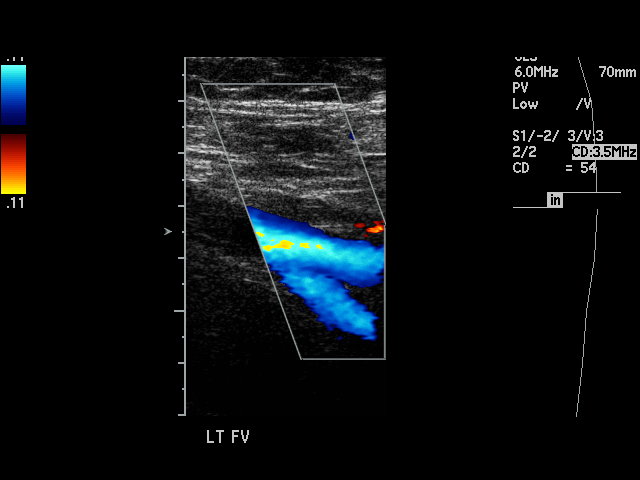
[im 41/45]
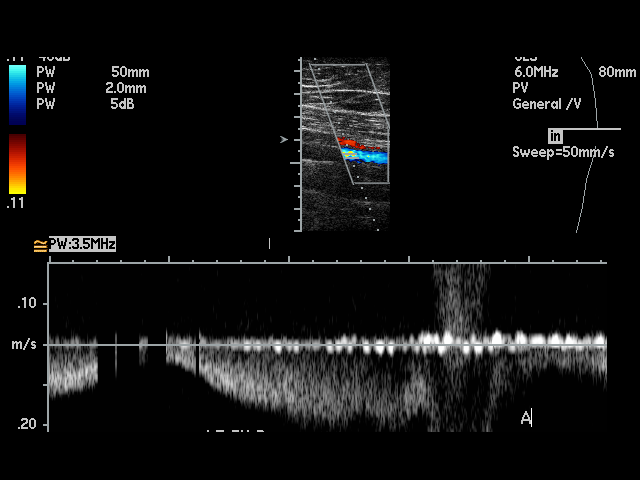
[im 45/45]
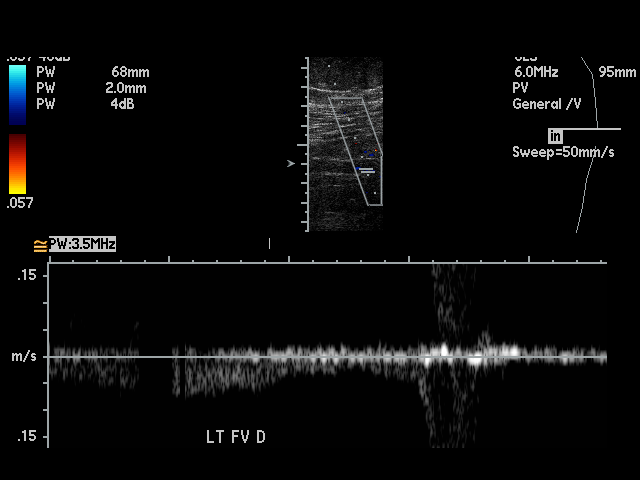

[17 of 24 positions shown; findings below may reference images not displayed]

PROCEDURE:     US  - US DOPPLER LOW EXTR BILATERAL  - [DATE]  [DATE]

RESULT:      Grayscale, duplex and spectral waveform imaging was performed
of the deep venous structures of the RIGHT and LEFT lower extremity.

There is no evidence of increased echogenicity, noncompressibility, abnormal
waveform or abnormal grayscale flow appreciated within the deep venous
structures of the RIGHT and LEFT lower extremity.
IMPRESSION: No sonographic evidence reflecting the sequela of deep venous thrombus
within the RIGHT or LEFT lower extremity.

## 2009-01-19 ENCOUNTER — Ambulatory Visit: Payer: Self-pay | Admitting: Internal Medicine

## 2009-01-19 IMAGING — US ABDOMEN ULTRASOUND
1 series · 17 of 25 positions shown · non-contrast
Comparison: none

REASON FOR EXAM: abd pain poss gallbladder disease
COMMENTS:

[Series 1: abdomen ultrasound · 17 of 81 slices shown]
[im 1/81]
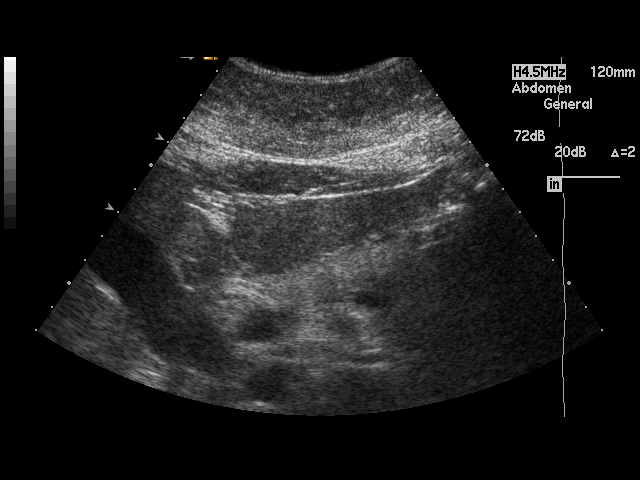
[im 7/81]
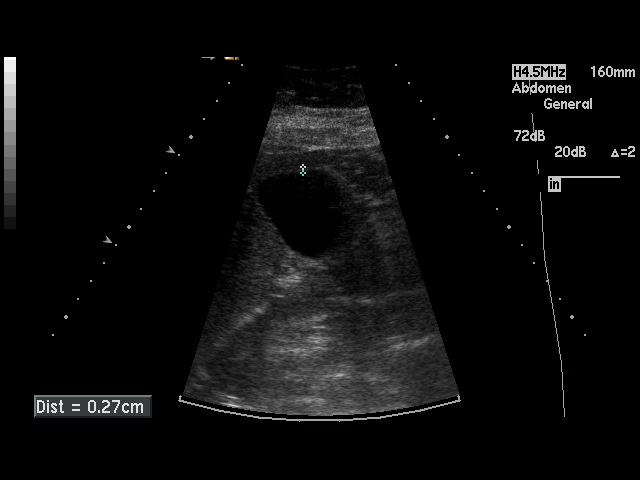
[im 11/81]
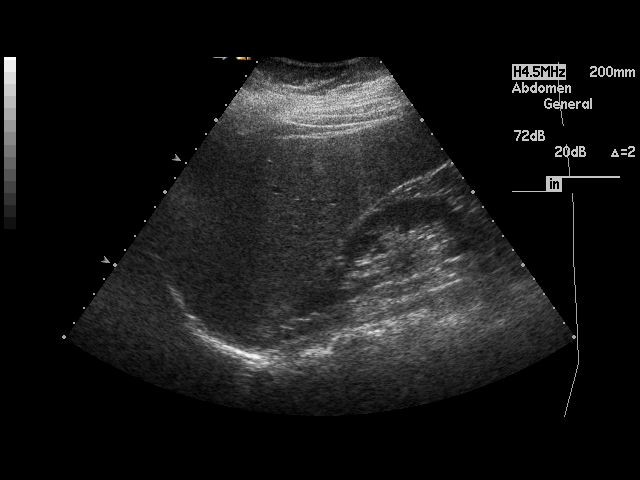
[im 17/81]
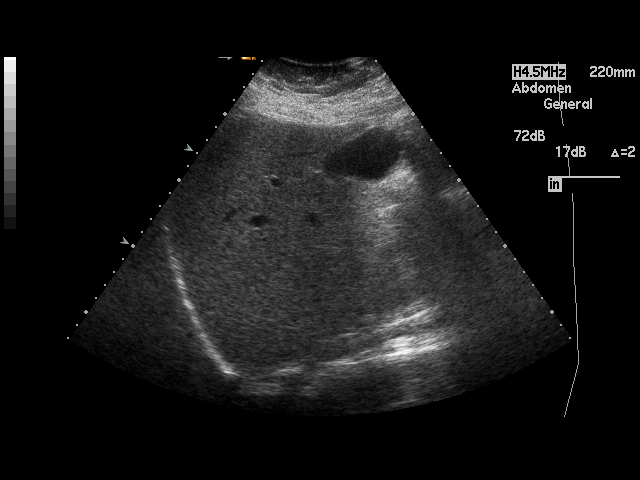
[im 21/81]
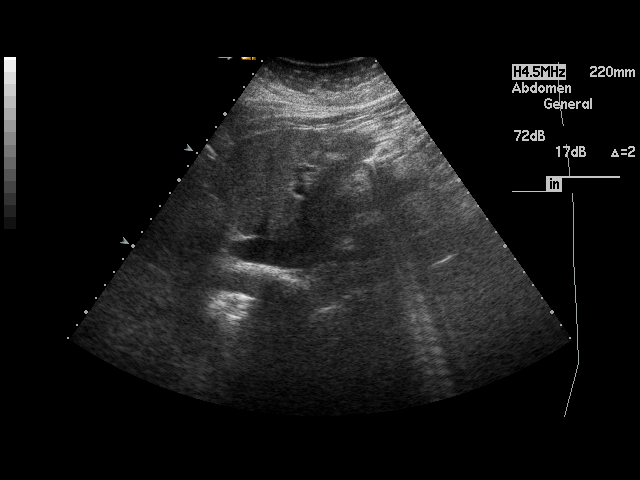
[im 27/81]
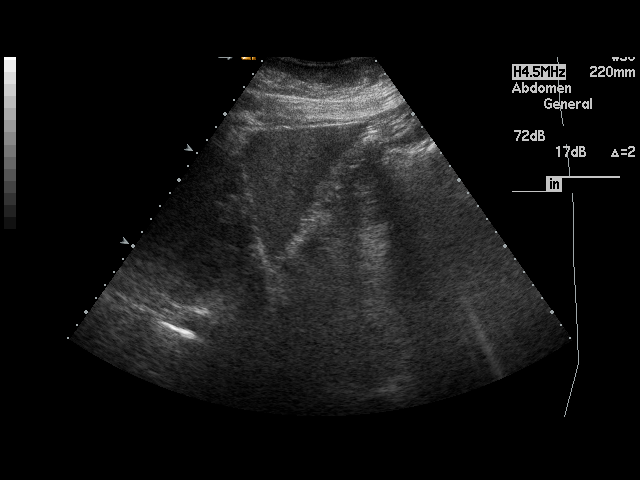
[im 31/81]
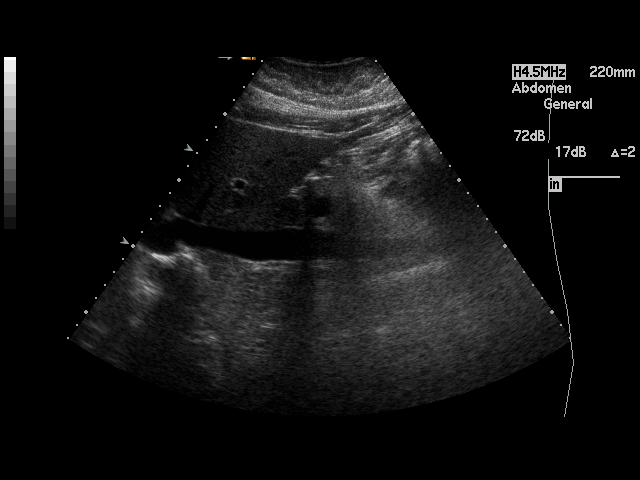
[im 37/81]
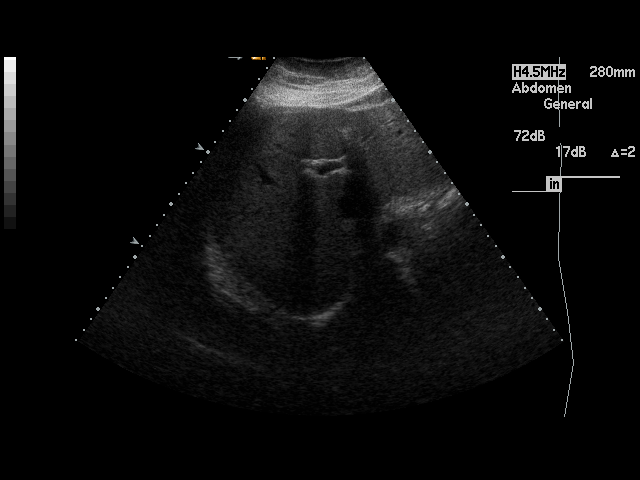
[im 41/81]
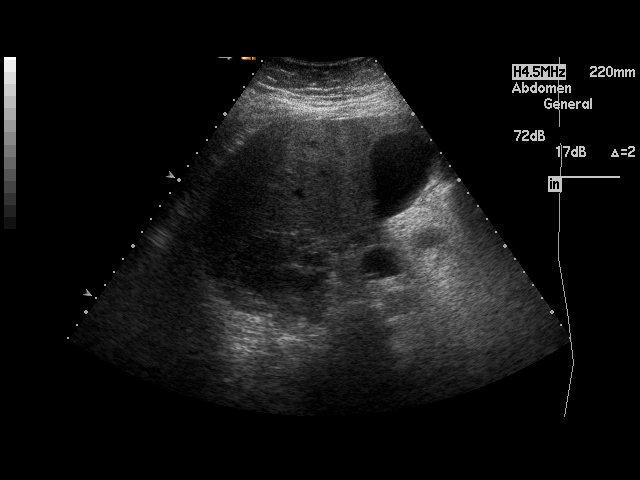
[im 44/81]
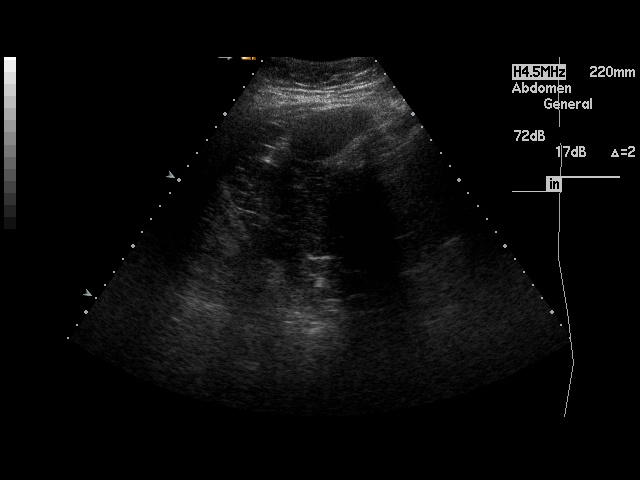
[im 51/81]
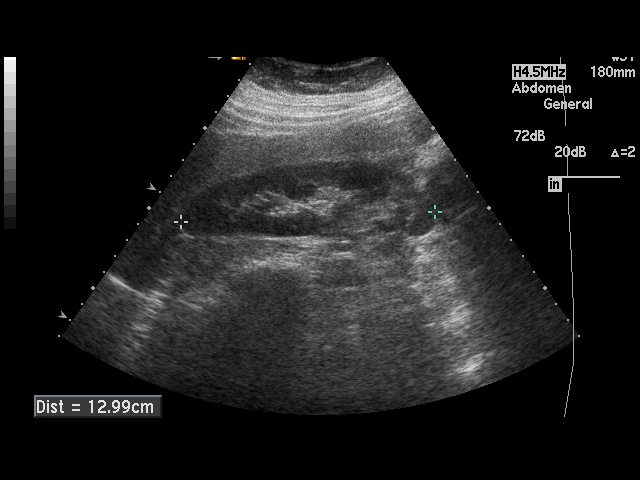
[im 54/81]
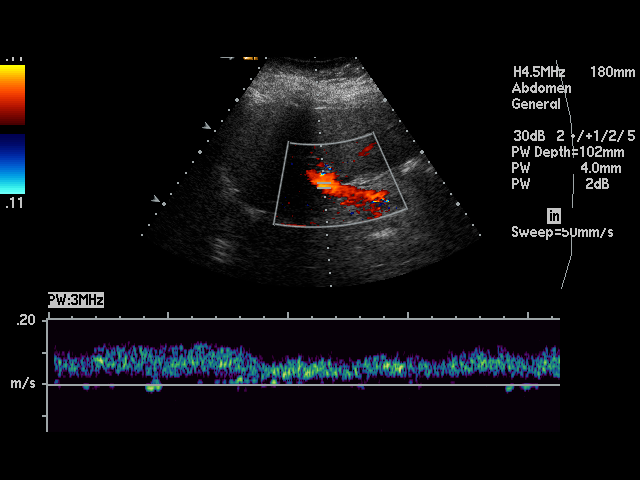
[im 61/81]
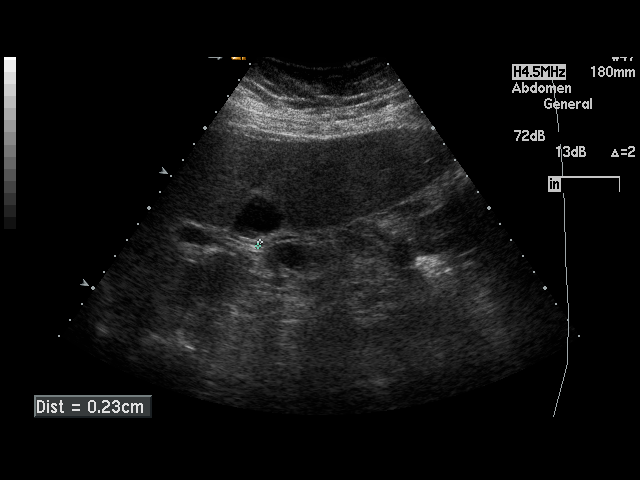
[im 64/81]
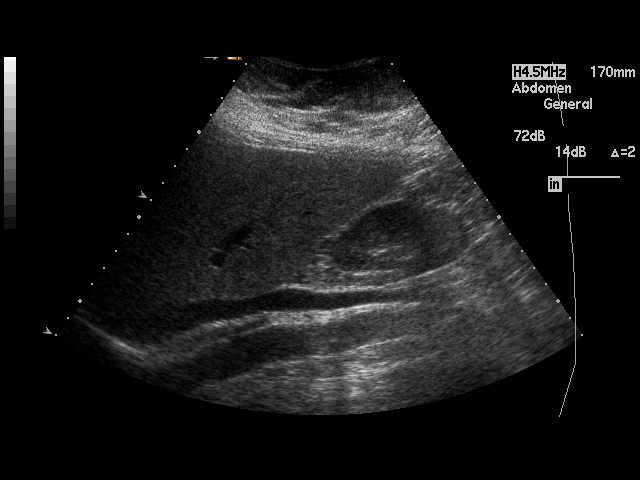
[im 71/81]
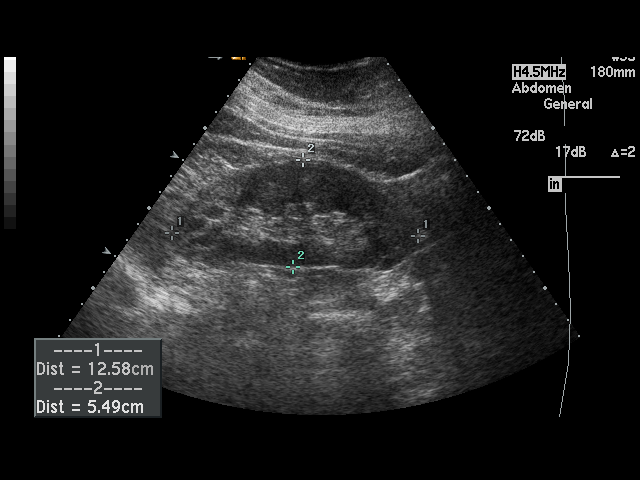
[im 74/81]
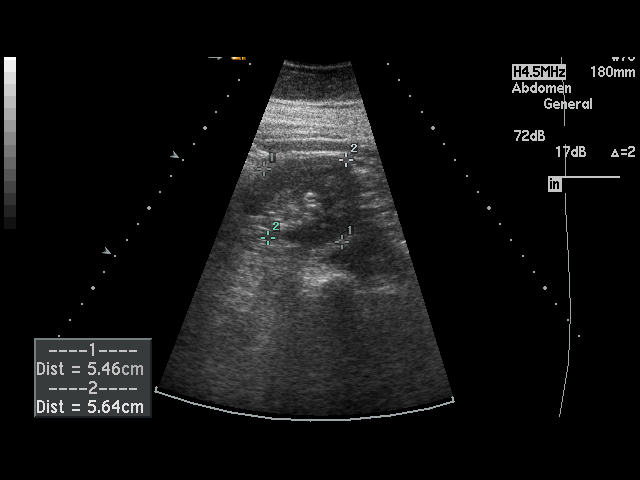
[im 81/81]
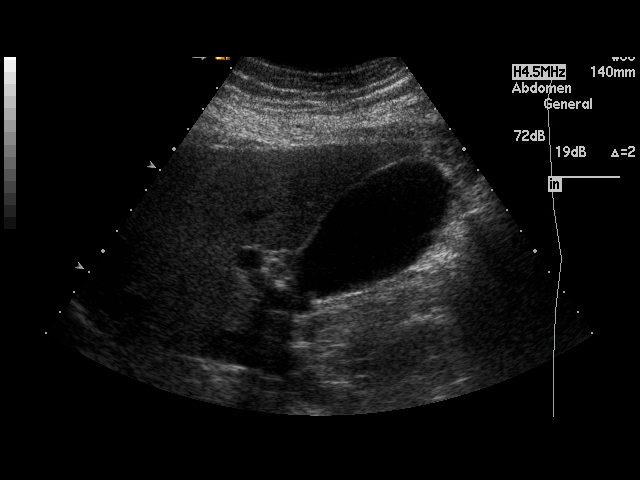

[17 of 25 positions shown; findings below may reference images not displayed]

PROCEDURE:     US  - US ABDOMEN GENERAL SURVEY  - [DATE]  [DATE]

RESULT:     The liver, spleen, pancreas and abdominal aorta reveal no
significant abnormalities. No gallstones are seen. There is no thickening of
the gallbladder wall which measures 2.7 mm at maximum thickness. The common
bile duct is normal in size and measures 3.7 mm at maximum diameter. The
kidneys show no hydronephrosis. There is no ascites.
IMPRESSION: No significant abnormalities are noted.

## 2009-01-25 ENCOUNTER — Ambulatory Visit: Payer: Self-pay | Admitting: Internal Medicine

## 2009-01-25 IMAGING — NM NUCLEAR MEDICINE HEPATOHBILIARY INCLUDE GB
3 series · 21 of 21 positions shown · non-contrast
Comparison: none

REASON FOR EXAM: RUQ abdominal pain  nausea
COMMENTS:

[Series 1000: gallbladder dynamic (results) · 4.80mm/px · 6 of 60 frames shown]
[frame 6/60]
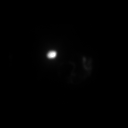
[frame 16/60]
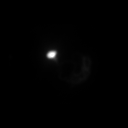
[frame 26/60]
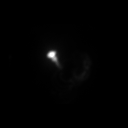
[frame 36/60]
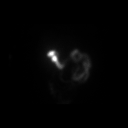
[frame 46/60]
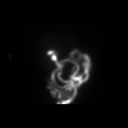
[frame 56/60]
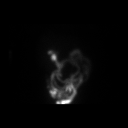

[Series 1000: gallbladder dynamic · 4.80mm/px · 6 of 60 frames shown]
[frame 6/60]
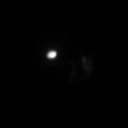
[frame 16/60]
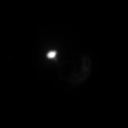
[frame 26/60]
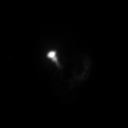
[frame 36/60]
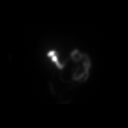
[frame 46/60]
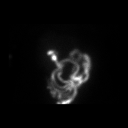
[frame 56/60]
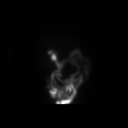

[Series 1000: gallbladder statics · 4.80mm/px · 9 of 9 slices shown]
[im 1/9]
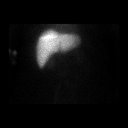
[im 2/9]
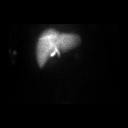
[im 3/9]
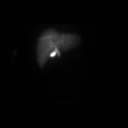
[im 4/9]
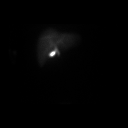
[im 5/9]
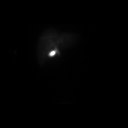
[im 6/9]
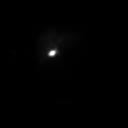
[im 7/9]
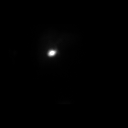
[im 8/9]
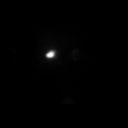
[im 9/9]
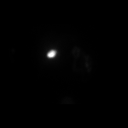

[21 of 21 positions shown; findings below may reference images not displayed]

PROCEDURE:     NM  - NM HEPATO WITH GB EJECT FRACTION  - [DATE] [DATE]

RESULT:     The patient received an injection of 8.85 mCi of technetium 99m
Choletec. There is prompt extraction of tracer from the blood pool by the
liver. Gallbladder and common bile duct activity is seen by 5 minutes after
the injection. Small bowel localization is present by 40 minutes after the
injection. There is progressive clearing of activity from the liver
throughout the exam with increasing gallbladder and bowel localization. A
continuous infusion of sincalide was initiated with a total dose of 2.1 mcg
administered intravenously over 30 minutes. The gallbladder ejection
fraction is calculated at 94%.
IMPRESSION: Normal-appearing hepatobiliary scan. Gallbladder ejection
fraction is 94% in response to sincalide infusion.

## 2009-09-21 ENCOUNTER — Ambulatory Visit: Payer: Self-pay | Admitting: Obstetrics and Gynecology

## 2010-04-13 ENCOUNTER — Ambulatory Visit: Payer: Self-pay | Admitting: Internal Medicine

## 2010-04-13 IMAGING — CT CT ANGIOGRAPHY HEAD
1 of 2 series · 19 of 30 positions shown · IV contrast (isovue)
Comparison: none

REASON FOR EXAM: sudden onset HA  eval aneurysm or bleed
COMMENTS:

PROCEDURE:     KCT - KCT ANGIOGRAPHY HEAD W/WO  - [DATE]  [DATE]
RESULT:     Comparison: CT of the brain [DATE]
TECHNIQUE: First, a standard noncontrast brain CT was performed. Then, the
CTA brain protocol was performed, after the administration of 100 mL Isovue
370.  3-D and MIP images were reviewed on a [employer] multiplanar work station.

[Series 9: cow · axial · 0.41mm/px · z∈[-56,+80]mm · 19 of 76 slices shown]
[im 4/76  brain]
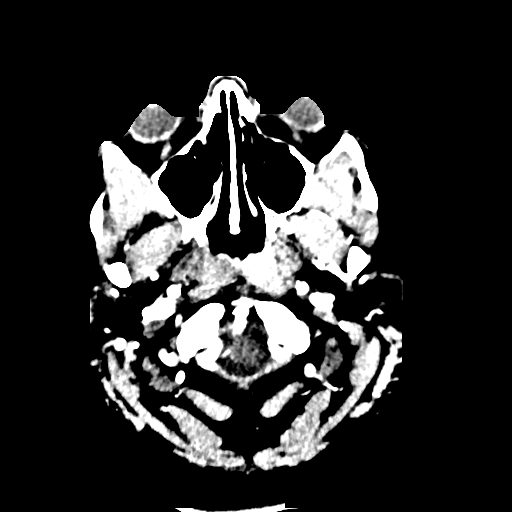
[im 8/76  bone]
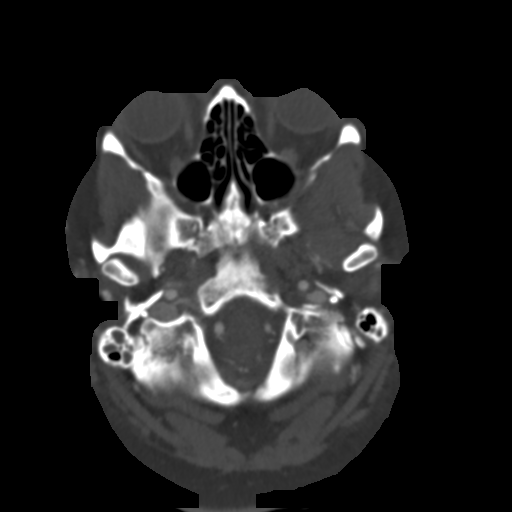
[im 12/76  brain]
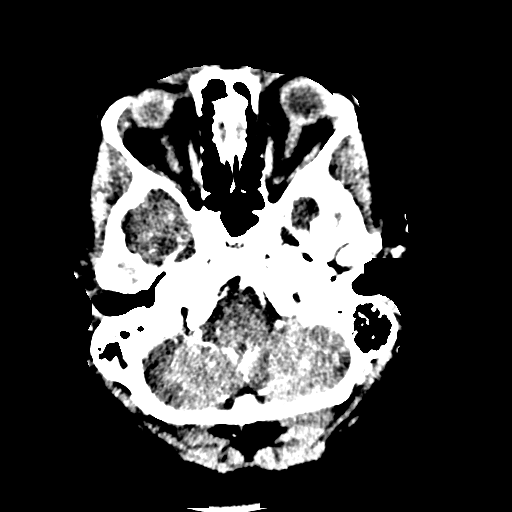
[im 16/76  bone]
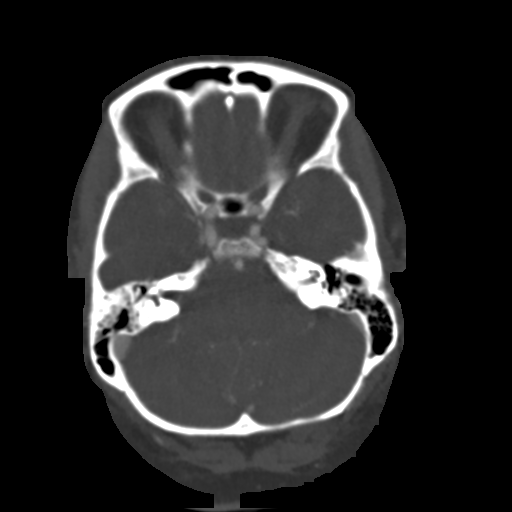
[im 19/76  brain]
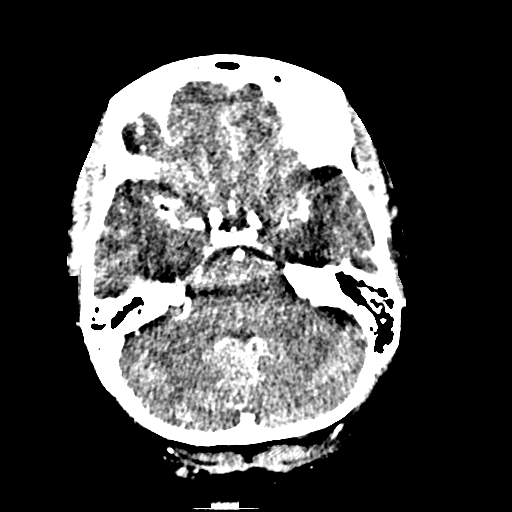
[im 23/76  bone]
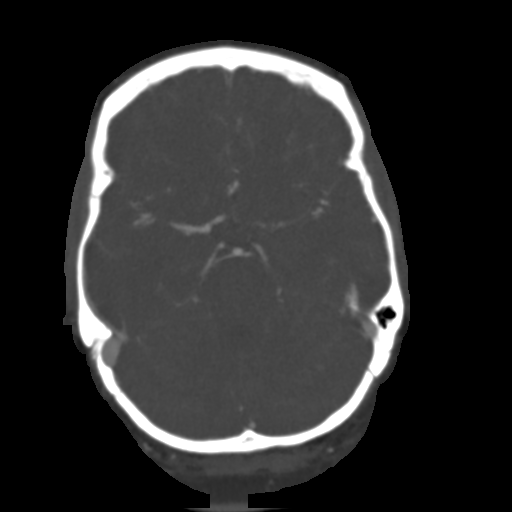
[im 27/76  brain]
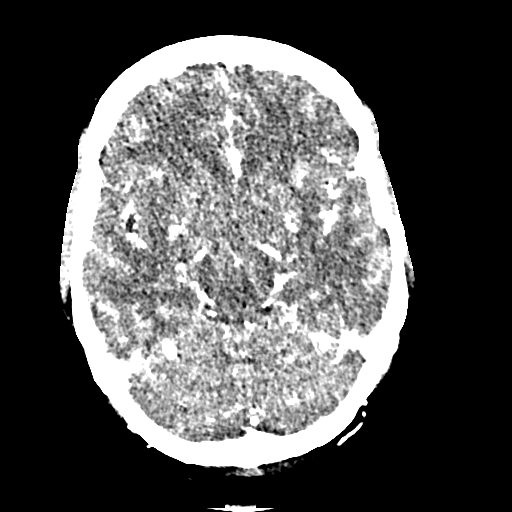
[im 31/76  bone]
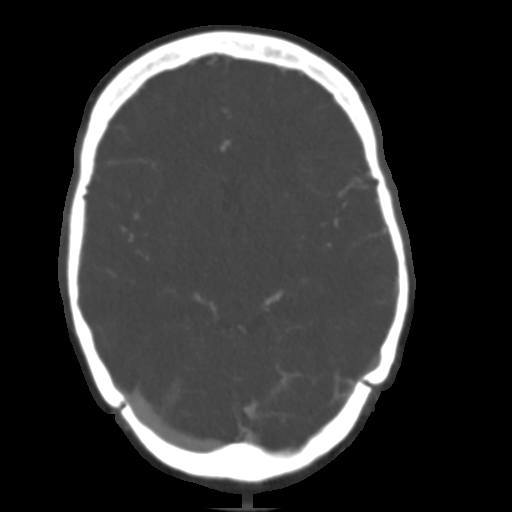
[im 34/76  brain]
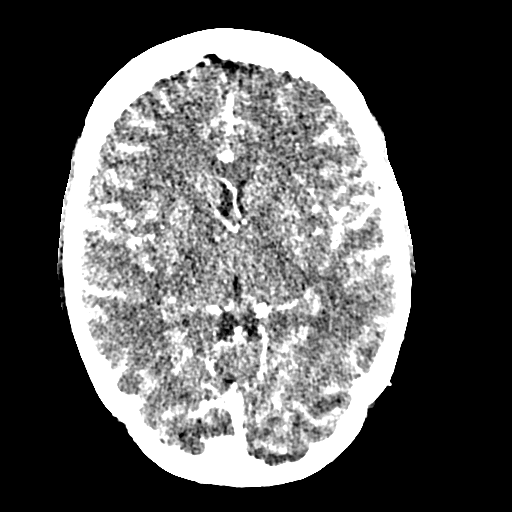
[im 38/76  bone]
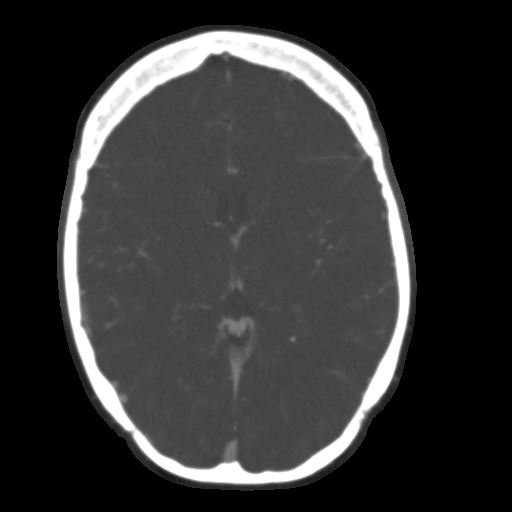
[im 42/76  brain]
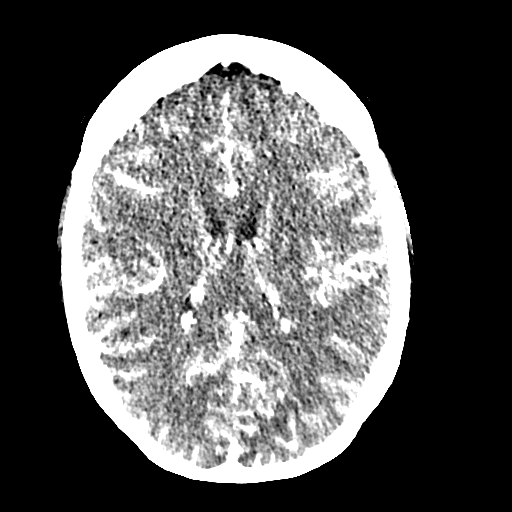
[im 46/76  bone]
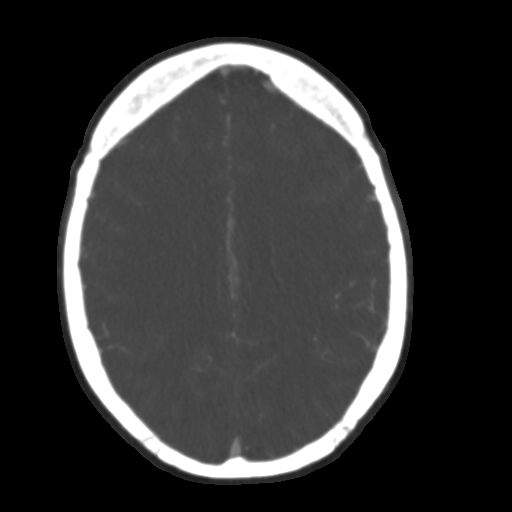
[im 49/76  brain]
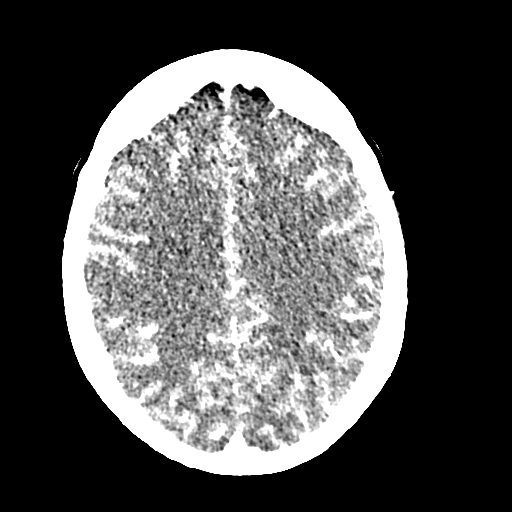
[im 53/76  bone]
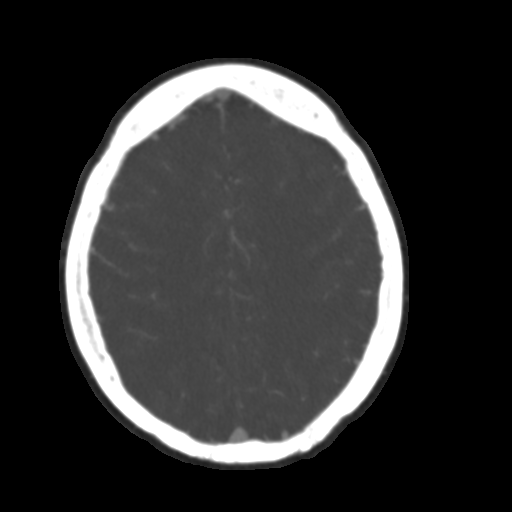
[im 57/76  brain]
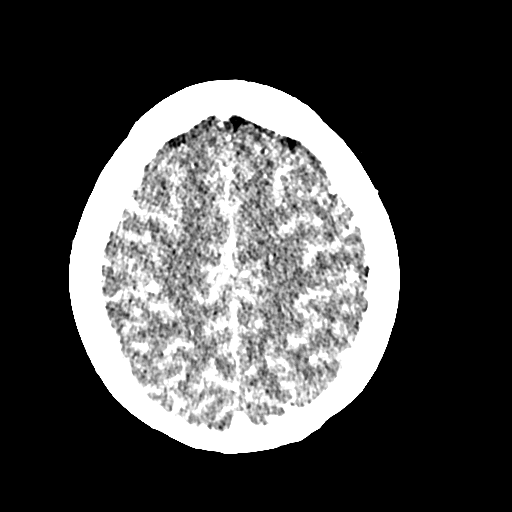
[im 61/76  bone]
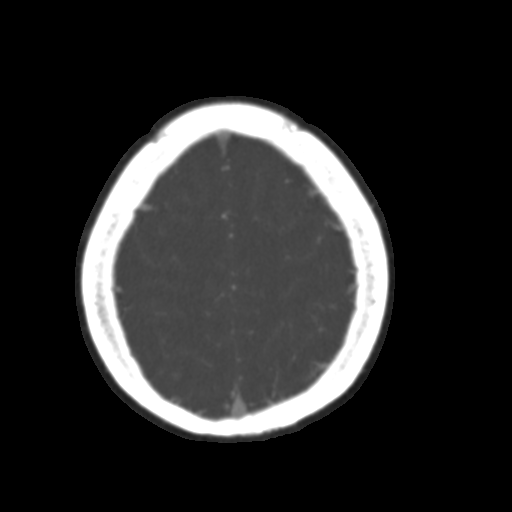
[im 64/76  brain]
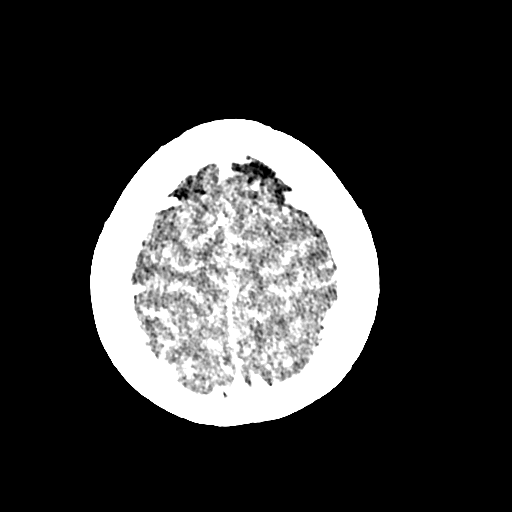
[im 68/76  bone]
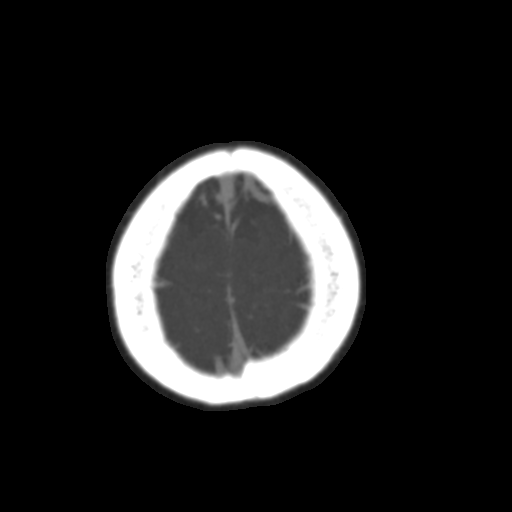
[im 72/76  brain]
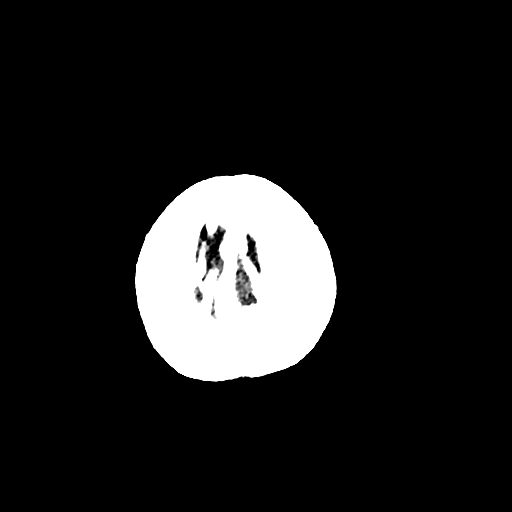

[19 of 30 positions shown; findings below may reference images not displayed]

FINDINGS: There is no evidence of intracranial hemorrhage. No midline shift, mass
effect, or extra-axial fluid collection. No evidence of acute cortical-based
infarct. There is minimal polypoid mucosal thickening in the left maxillary
sinus.

The intracranial carotid arteries are patent. The anterior and middle
cerebral arteries are patent. There is an anterior communicating artery. The
vertebral arteries and basilar artery are patent. Posterior communicating
arteries are not definitely visualized. However, this may be related to the
relatively delayed phase of arterial imaging. The posterior cerebral
arteries are patent. Superior cerebellar arteries are patent. No aneurysm
identified.

Although the study was not tailored for venous evaluation, contrast
opacification is demonstrated in the sagittal and sigmoid sinuses.
IMPRESSION: 1. No acute intracranial process.
2. No aneurysm identified.

## 2010-09-09 ENCOUNTER — Ambulatory Visit: Payer: Self-pay | Admitting: Internal Medicine

## 2010-11-30 ENCOUNTER — Ambulatory Visit: Payer: Self-pay | Admitting: Obstetrics and Gynecology

## 2011-01-03 ENCOUNTER — Ambulatory Visit: Payer: Self-pay | Admitting: Internal Medicine

## 2011-01-03 IMAGING — NM NUCLEAR MEDICINE HEPATOHBILIARY INCLUDE GB
1 series · 10 of 10 positions shown · non-contrast
Comparison: none

REASON FOR EXAM: ruq pain rt abd pain
COMMENTS:

[Series 1000: gallbladder statics · 4.80mm/px · 10 of 10 slices shown]
[im 1/10]
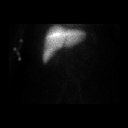
[im 2/10]
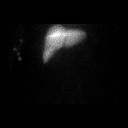
[im 3/10]
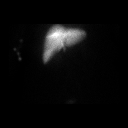
[im 4/10]
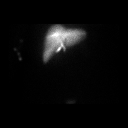
[im 5/10]
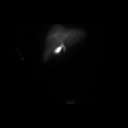
[im 6/10]
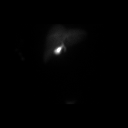
[im 7/10]
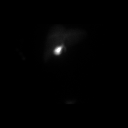
[im 8/10]
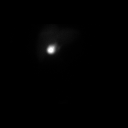
[im 9/10]
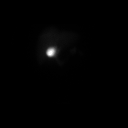
[im 10/10]
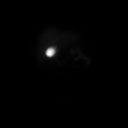

[10 of 10 positions shown; findings below may reference images not displayed]

PROCEDURE:     NM  - NM HEPATOBILIARY IMAGE  - [DATE] [DATE]

RESULT:     Following intravenous administration of 8.385 mCi technetium 99m
Choletec, there is noted prompt visualization of tracer activity in the
liver at 3 minutes. At 30 minutes tracer activity is visualized in the
gallbladder and common duct. At 80 minutes tracer activity is visualized in
the proximal small bowel. There is a mild nonspecific delay in visualization
of tracer activity in the small bowel.
IMPRESSION: 1.     Normal study. There is prompt visualization of tracer activity in the
gallbladder and common duct. There is a delay in visualization of tracer
activity in the bowel but this is not of known significance. There is
observed normal clearance of tracer activity from the liver.

## 2011-05-12 ENCOUNTER — Emergency Department: Payer: Self-pay | Admitting: Emergency Medicine

## 2011-05-12 LAB — COMPREHENSIVE METABOLIC PANEL
Alkaline Phosphatase: 35 U/L — ABNORMAL LOW (ref 50–136)
Bilirubin,Total: 0.3 mg/dL (ref 0.2–1.0)
Chloride: 105 mmol/L (ref 98–107)
Co2: 26 mmol/L (ref 21–32)
Creatinine: 0.67 mg/dL (ref 0.60–1.30)
EGFR (African American): >60
Potassium: 4 mmol/L (ref 3.5–5.1)
Sodium: 140 mmol/L (ref 136–145)
Total Protein: 7 g/dL (ref 6.4–8.2)

## 2011-05-12 LAB — CK TOTAL AND CKMB (NOT AT ARMC): CK, Total: 44 U/L (ref 21–215)

## 2011-05-12 LAB — CBC
HCT: 40 % (ref 35.0–47.0)
HGB: 13.4 g/dL (ref 12.0–16.0)
MCH: 29.4 pg (ref 26.0–34.0)
MCV: 88 fL (ref 80–100)
RBC: 4.56 10*6/uL (ref 3.80–5.20)

## 2011-05-12 LAB — TROPONIN I: Troponin-I: <0.02 ng/mL

## 2011-05-12 IMAGING — CR DG CHEST 1V
1 series · 2 of 2 positions shown · non-contrast
Comparison: none

REASON FOR EXAM: chest pain, shortness of breath
COMMENTS:   May transport without cardiac monitor

PROCEDURE:     DXR - DXR CHEST 1 VIEWAP OR PA  - [DATE] [DATE]
RESULT:     PA view of the chest shows the lung fields to be clear. The
heart, mediastinal and osseous structures reveal no significant
abnormalities.

[Series 1: pa · 0.17mm/px · 2 of 2 slices shown]
[im 1/2]
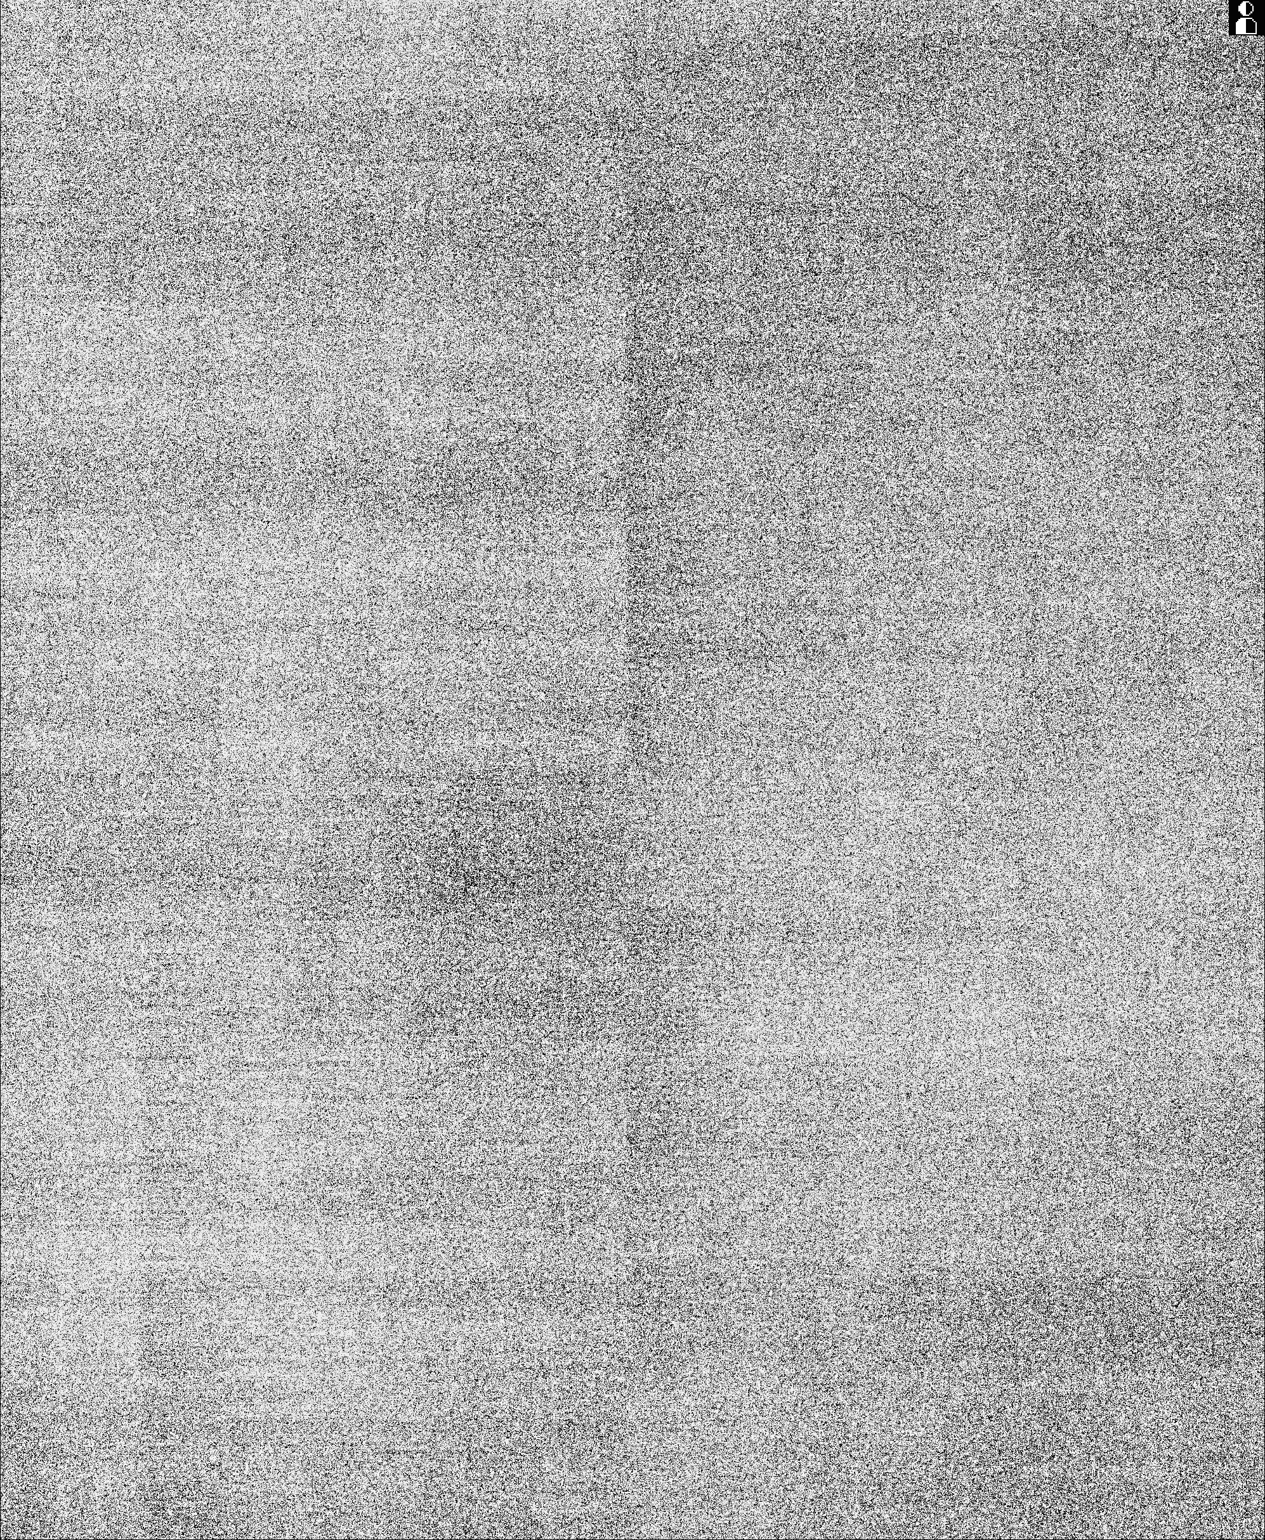
[im 2/2]
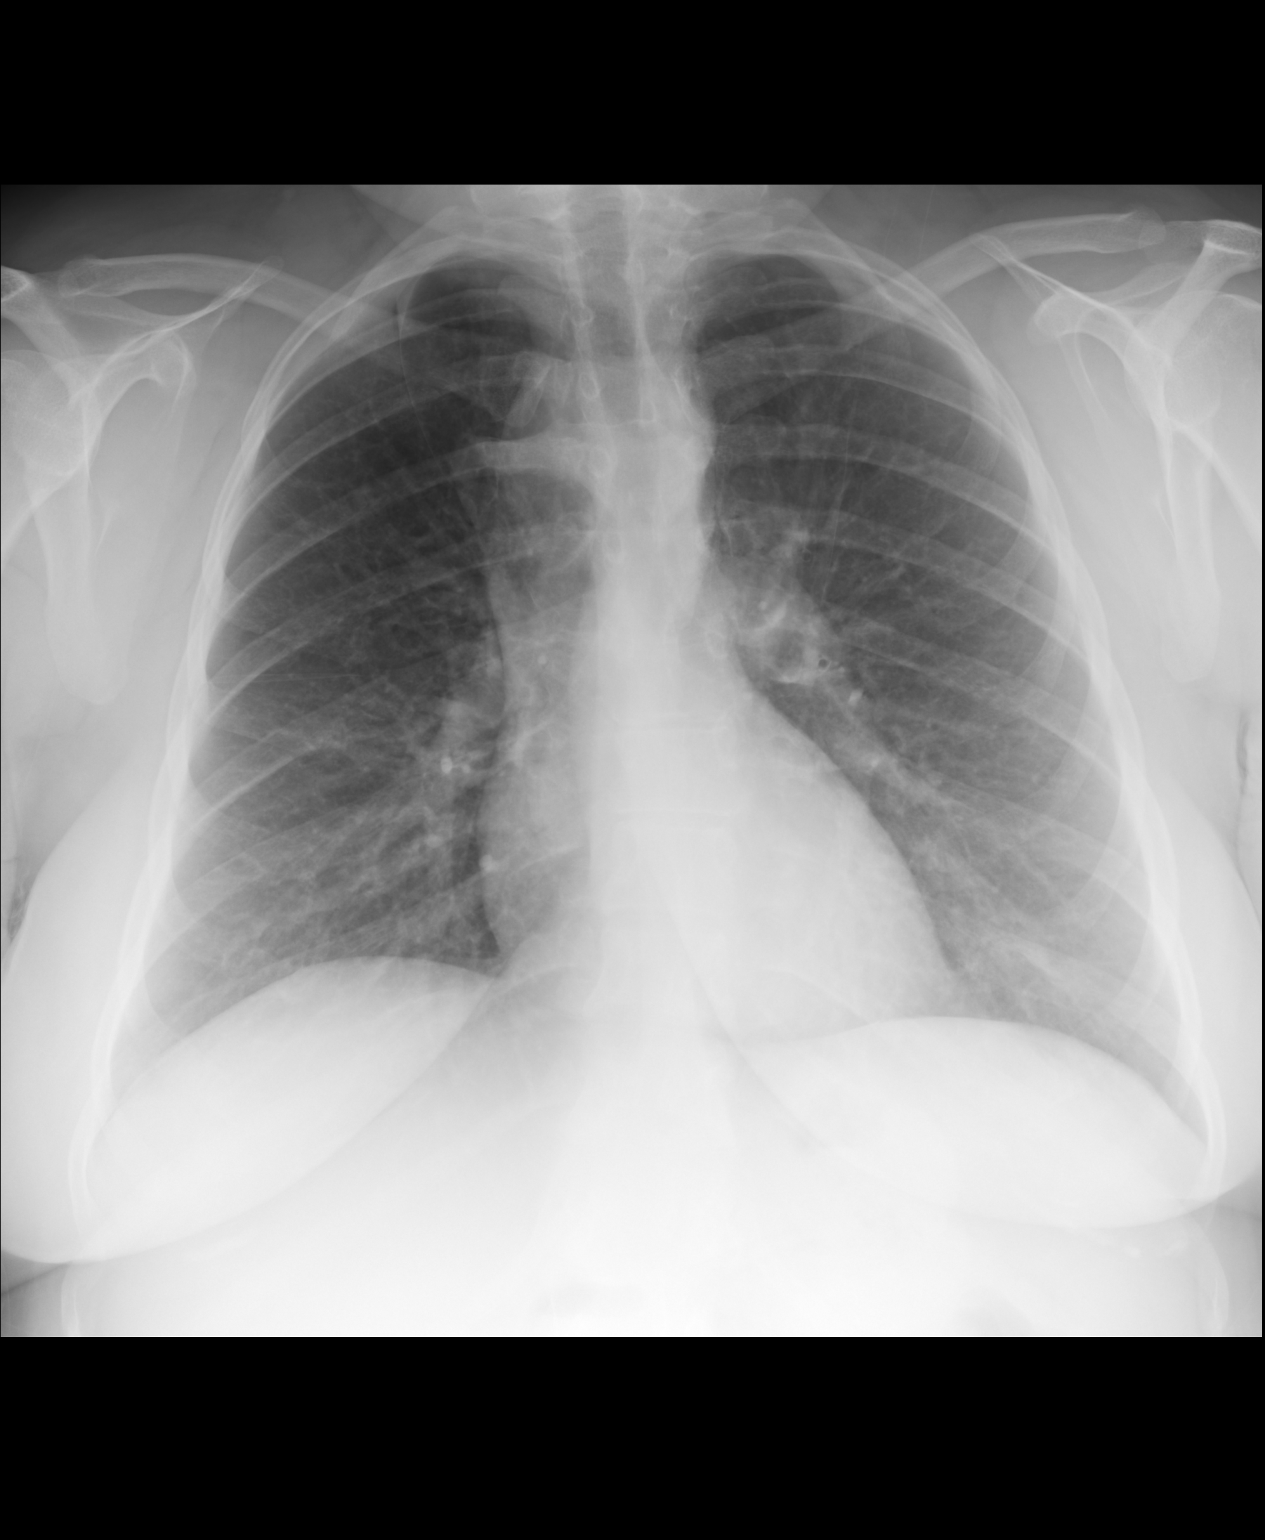

[2 of 2 positions shown; findings below may reference images not displayed]

IMPRESSION: 1.     No significant abnormalities are identified.

## 2011-08-14 ENCOUNTER — Other Ambulatory Visit: Payer: Self-pay | Admitting: Gastroenterology

## 2011-08-15 ENCOUNTER — Ambulatory Visit: Payer: Self-pay | Admitting: Internal Medicine

## 2011-08-17 LAB — STOOL CULTURE

## 2011-08-20 ENCOUNTER — Emergency Department: Payer: Self-pay | Admitting: Emergency Medicine

## 2011-08-20 LAB — URINALYSIS, COMPLETE
Bacteria: NONE SEEN
Bilirubin,UR: NEGATIVE
Blood: NEGATIVE
Glucose,UR: NEGATIVE mg/dL (ref 0–75)
Ketone: NEGATIVE
Nitrite: NEGATIVE
Ph: 6 (ref 4.5–8.0)
RBC,UR: 6 /HPF (ref 0–5)
Specific Gravity: 1.021 (ref 1.003–1.030)
Squamous Epithelial: 3

## 2011-08-20 LAB — COMPREHENSIVE METABOLIC PANEL
Albumin: 3.6 g/dL (ref 3.4–5.0)
Alkaline Phosphatase: 46 U/L — ABNORMAL LOW (ref 50–136)
BUN: 15 mg/dL (ref 7–18)
Bilirubin,Total: 0.3 mg/dL (ref 0.2–1.0)
Calcium, Total: 8.5 mg/dL (ref 8.5–10.1)
Chloride: 102 mmol/L (ref 98–107)
Co2: 27 mmol/L (ref 21–32)
EGFR (African American): >60
Osmolality: 278 (ref 275–301)
Potassium: 3.9 mmol/L (ref 3.5–5.1)
Sodium: 139 mmol/L (ref 136–145)
Total Protein: 7.5 g/dL (ref 6.4–8.2)

## 2011-08-20 LAB — CBC
HGB: 14.2 g/dL (ref 12.0–16.0)
RDW: 13.7 % (ref 11.5–14.5)
WBC: 9.7 10*3/uL (ref 3.6–11.0)

## 2011-08-20 IMAGING — US ABDOMEN ULTRASOUND
1 series · 14 of 25 positions shown · non-contrast
Comparison: none

REASON FOR EXAM: LUQ pain, LLQ pain
COMMENTS:   May transport without cardiac monitor

PROCEDURE:     US  - US ABDOMEN GENERAL SURVEY  - [DATE]  [DATE]
RESULT:     Comparison: [DATE]
TECHNIQUE: Multiple grayscale and color Doppler images were obtained of the
abdomen.

[Series 1: abdomen ultrasound · 0.31mm/px · 14 of 91 slices shown]
[im 1/91]
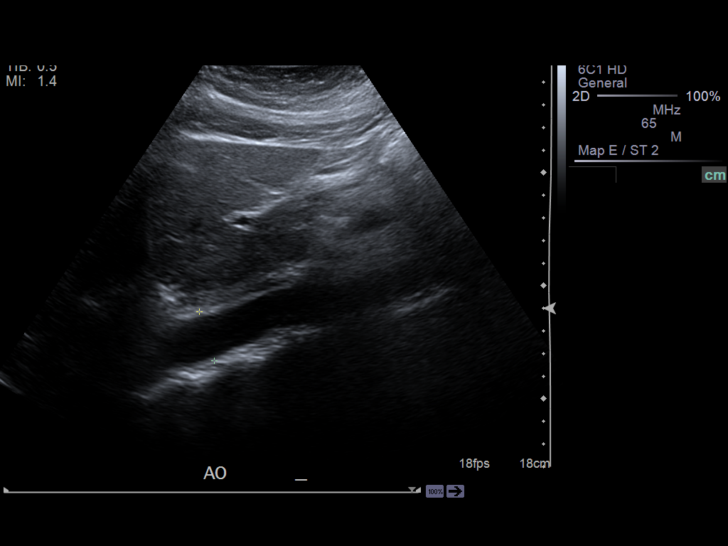
[im 8/91]
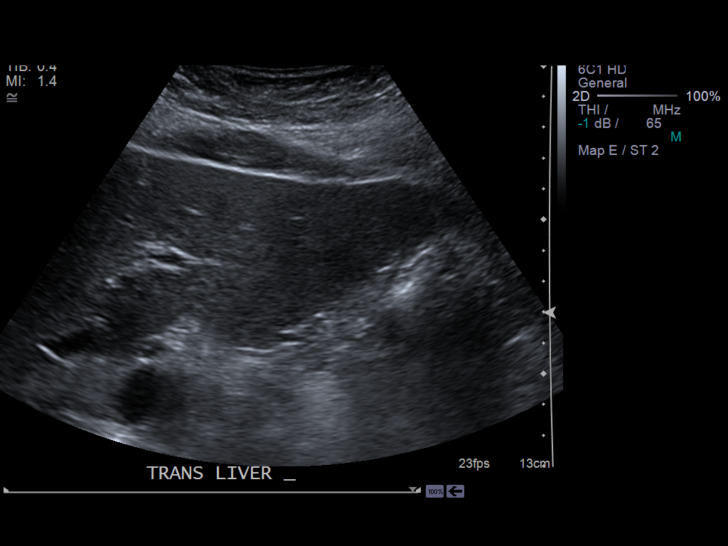
[im 16/91]
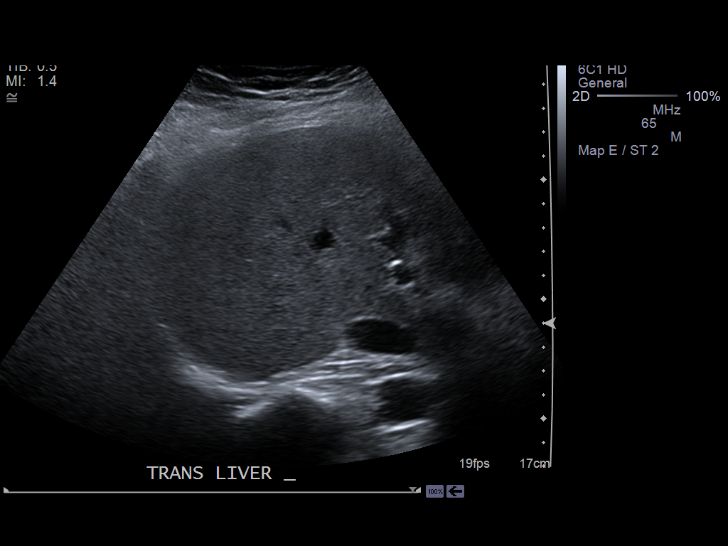
[im 23/91]
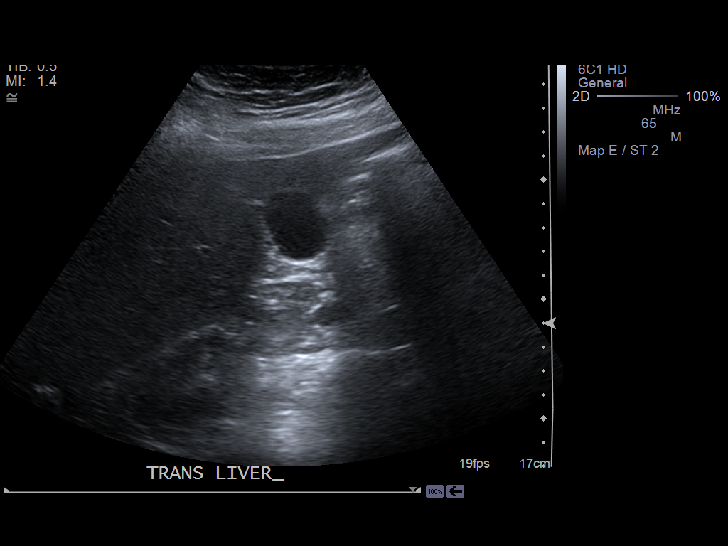
[im 31/91]
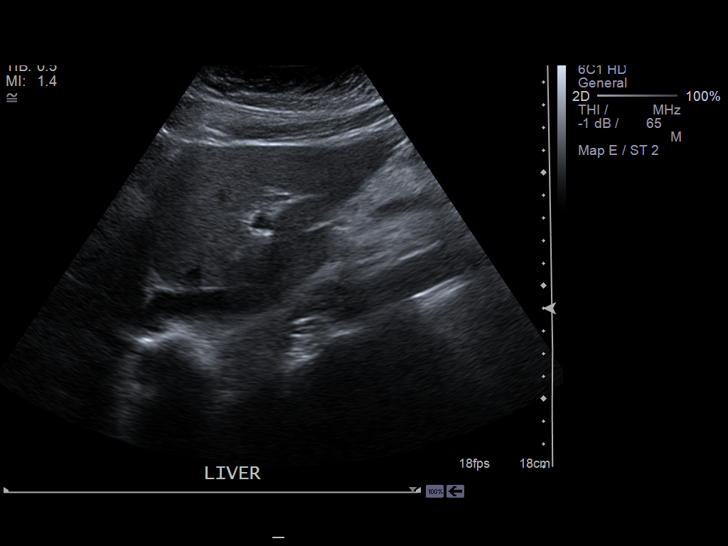
[im 34/91]
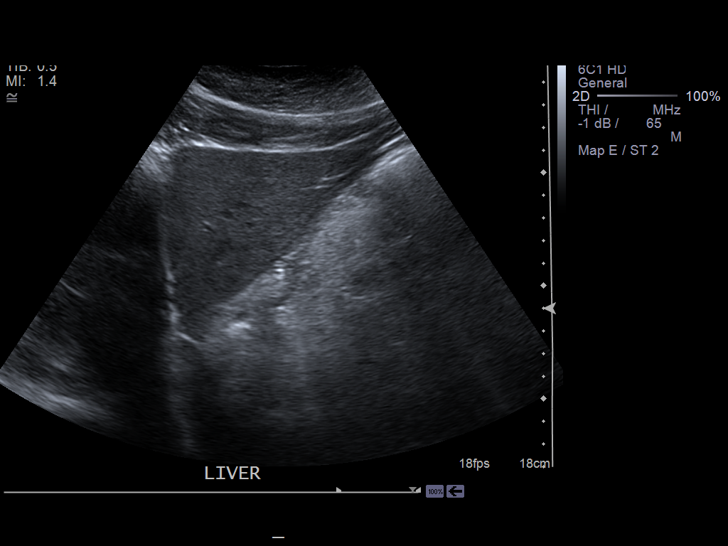
[im 42/91]
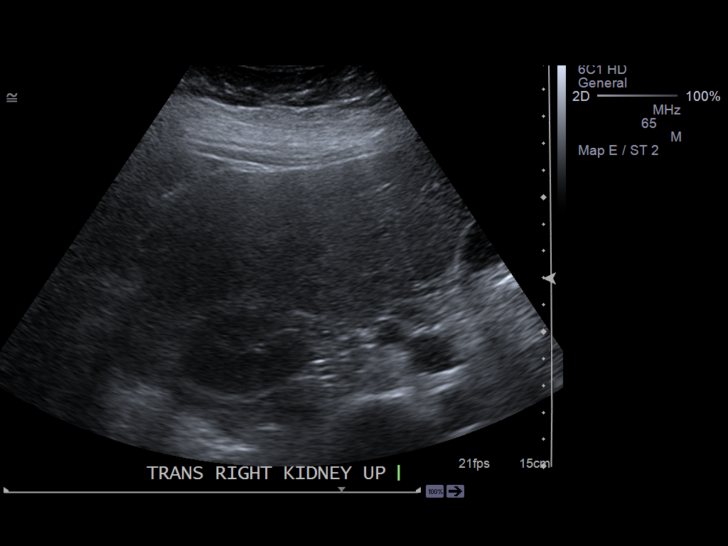
[im 49/91]
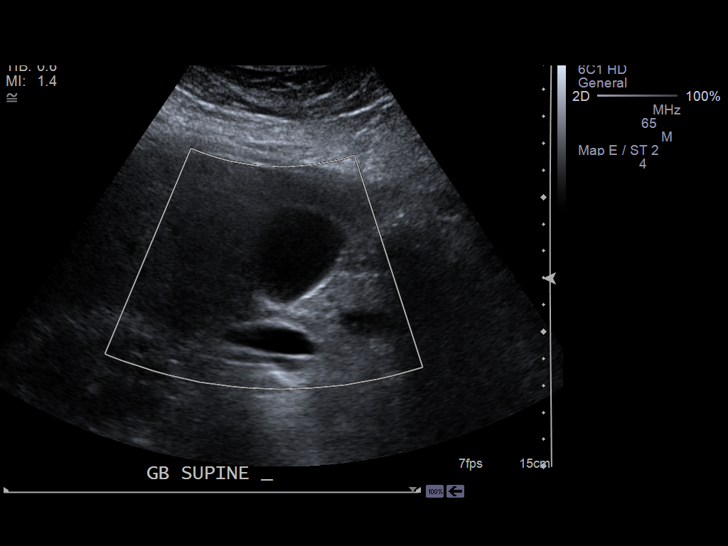
[im 57/91]
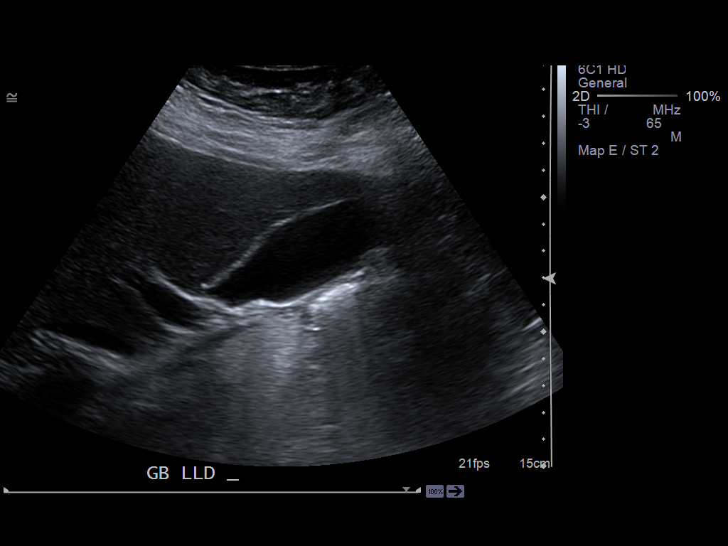
[im 61/91]
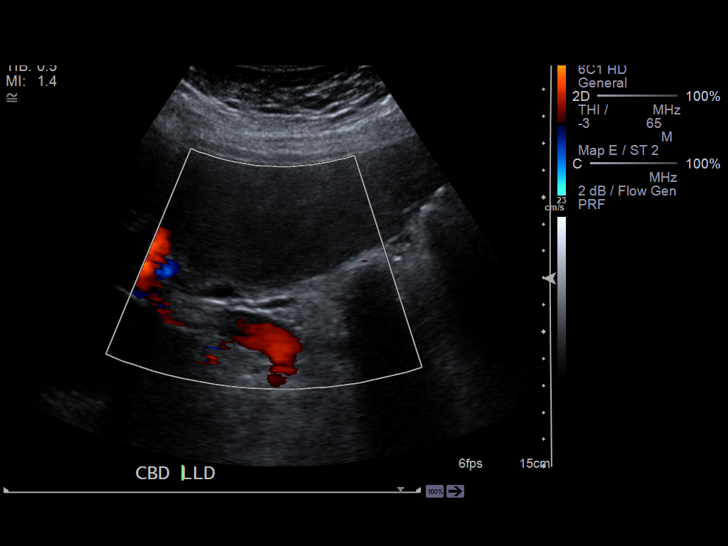
[im 68/91]
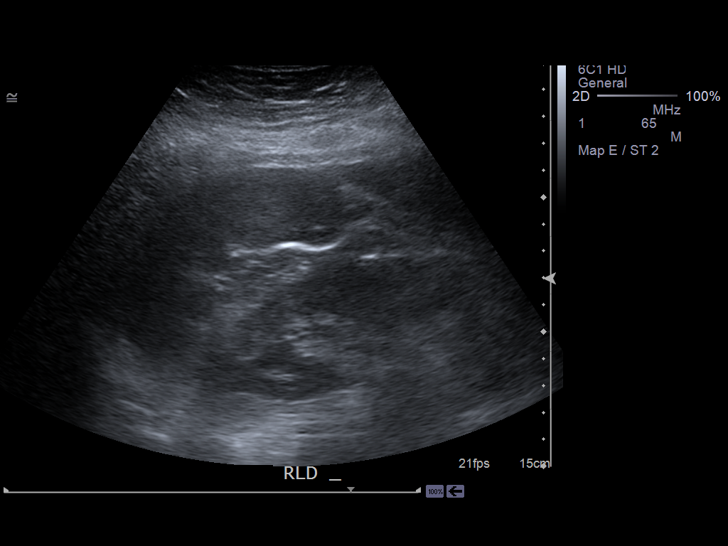
[im 76/91]
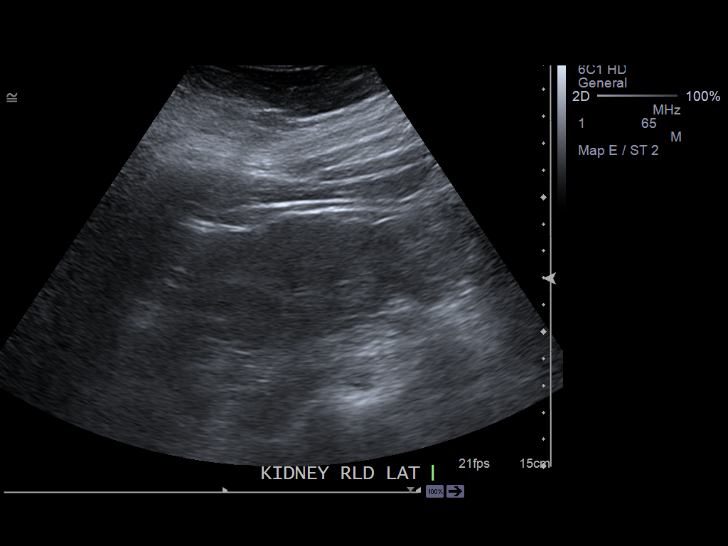
[im 83/91]
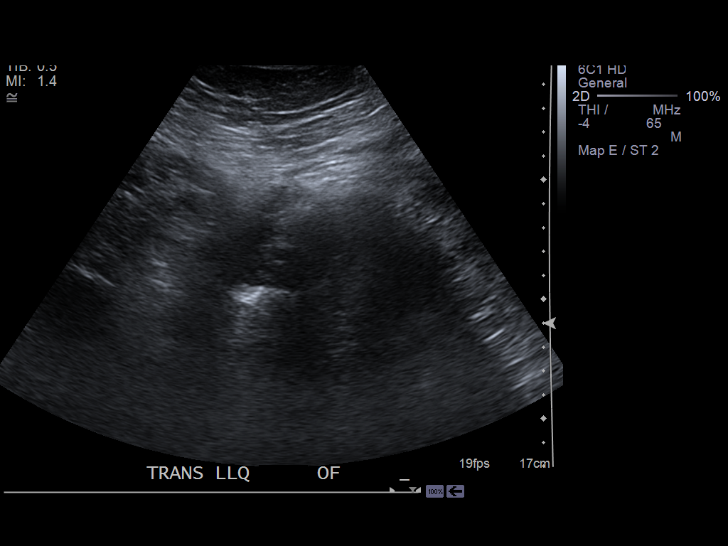
[im 91/91]
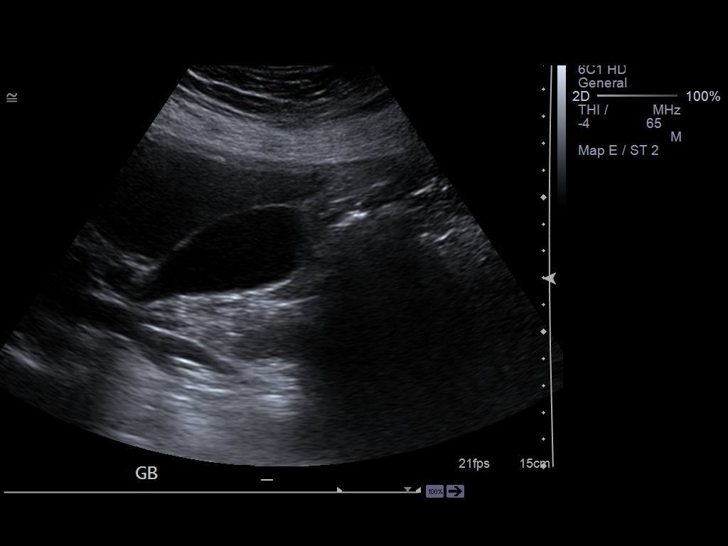

[14 of 25 positions shown; findings below may reference images not displayed]

FINDINGS: The visualized liver, spleen, and pancreas are unremarkable. The portal vein
was patent. The gallbladder is normal. The sonographic Murphy sign was
negative. The common duct measures 4 mm.

Images of the kidneys showed no hydronephrosis. The right kidney measures
12.9 x 5.1 x 4.6 cm. The left kidney measured 11.9 x 5.2 x 4.7 cm. The
abdominal aorta is normal in caliber.

Limited images were performed of the area of the patient's pain in the left
lower quadrant. No definite abnormality seen, although these images were
mostly obscured by bowel gas.
IMPRESSION: No acute findings.

## 2011-08-22 LAB — URINE CULTURE

## 2011-08-29 ENCOUNTER — Ambulatory Visit: Payer: Self-pay | Admitting: Gastroenterology

## 2011-08-29 IMAGING — US TRANSABDOMINAL ULTRASOUND OF PELVIS
1 series · 14 of 25 positions shown · non-contrast
Comparison: none

REASON FOR EXAM: Rt ovarian cyst
COMMENTS:

[Series 1: transabdominal ultrasound of pelvis · 0.30mm/px · 14 of 74 slices shown]
[im 1/74]
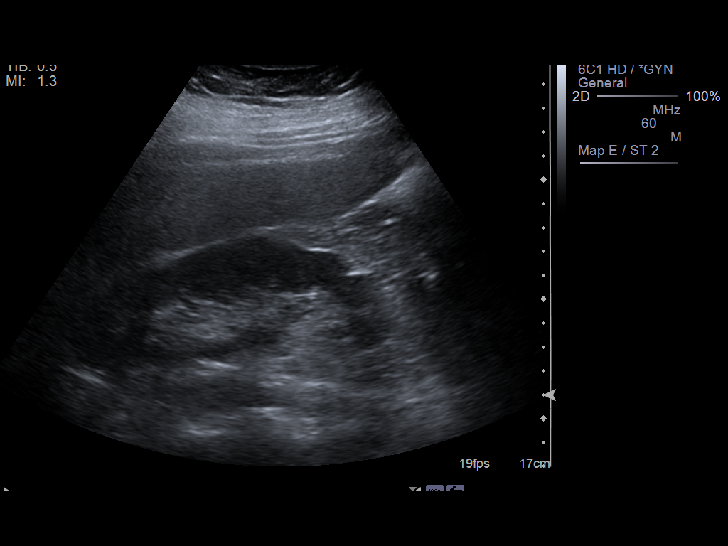
[im 7/74]
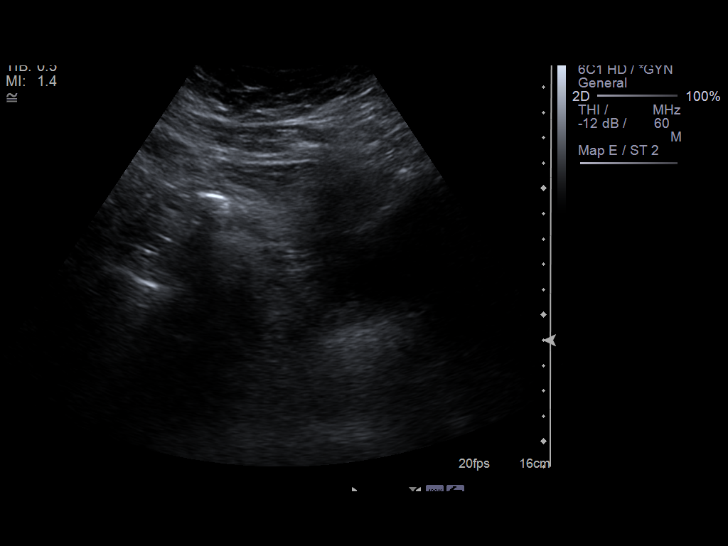
[im 13/74]
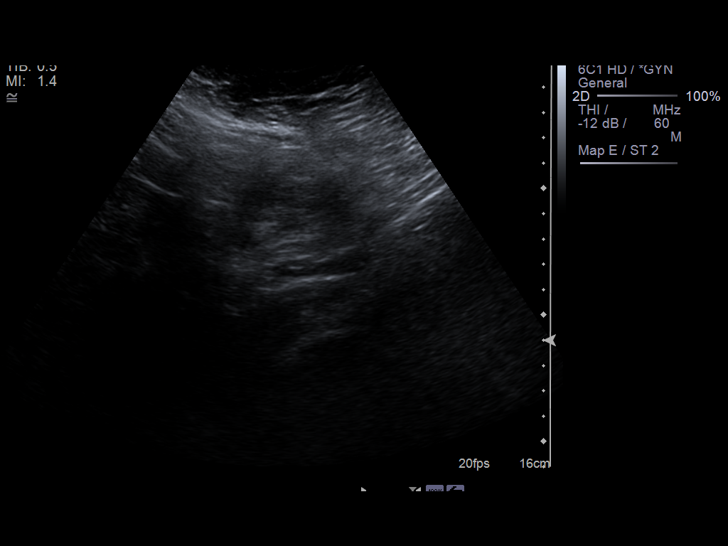
[im 19/74]
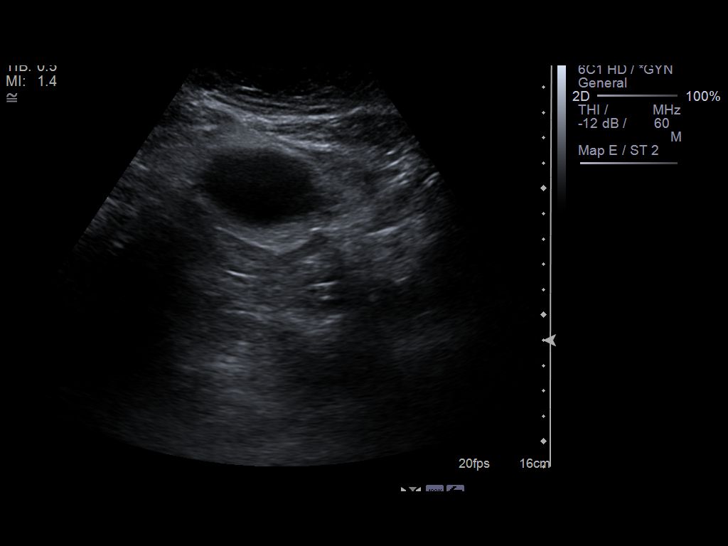
[im 25/74]
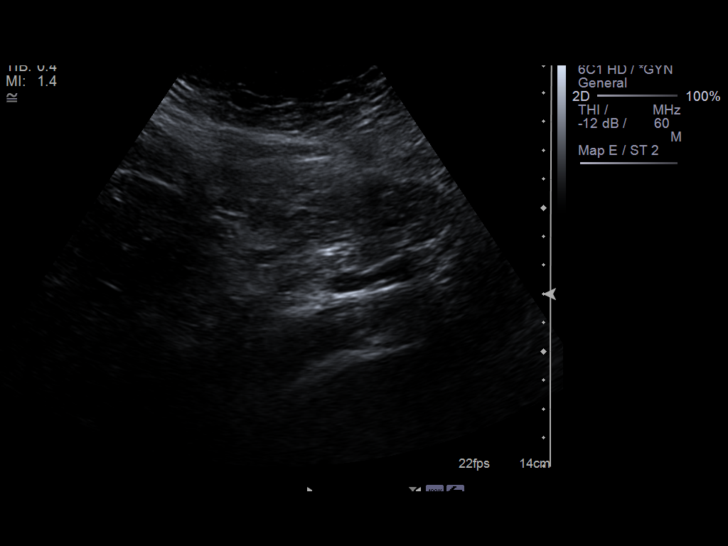
[im 28/74]
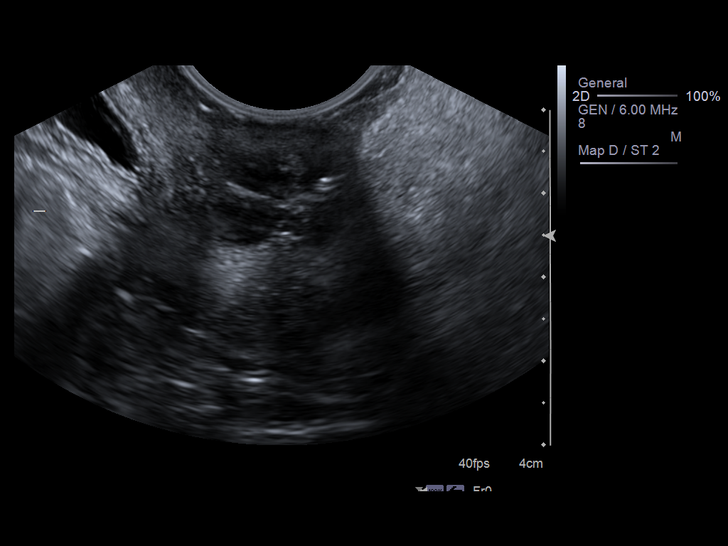
[im 34/74]
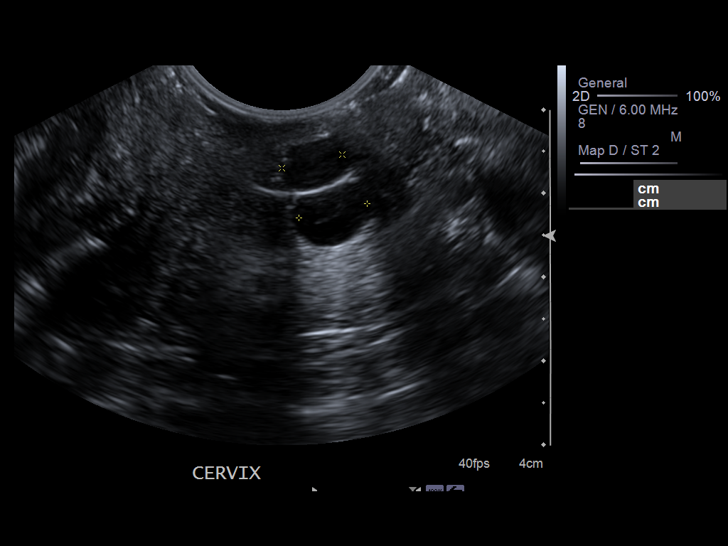
[im 40/74]
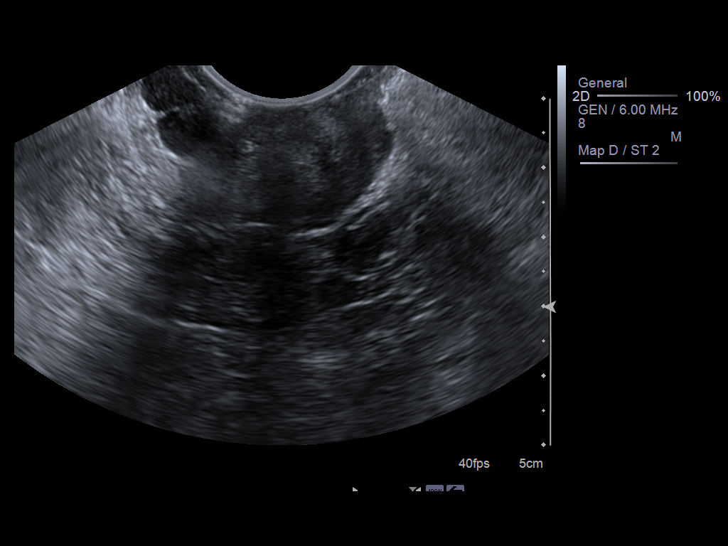
[im 46/74]
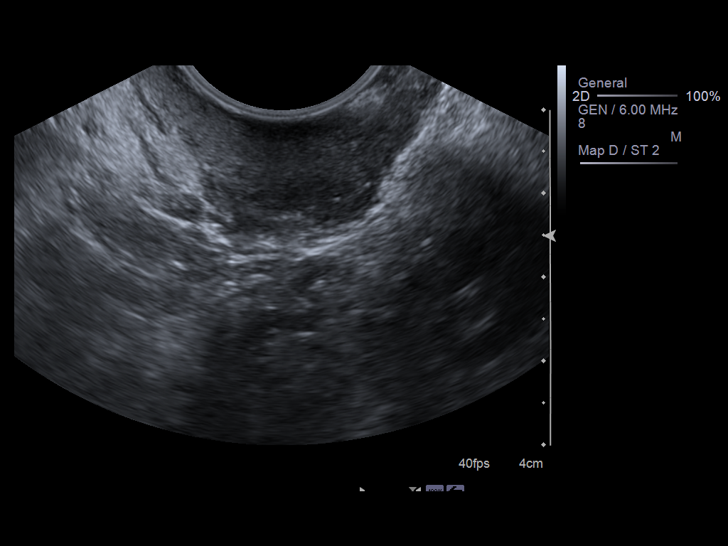
[im 49/74]
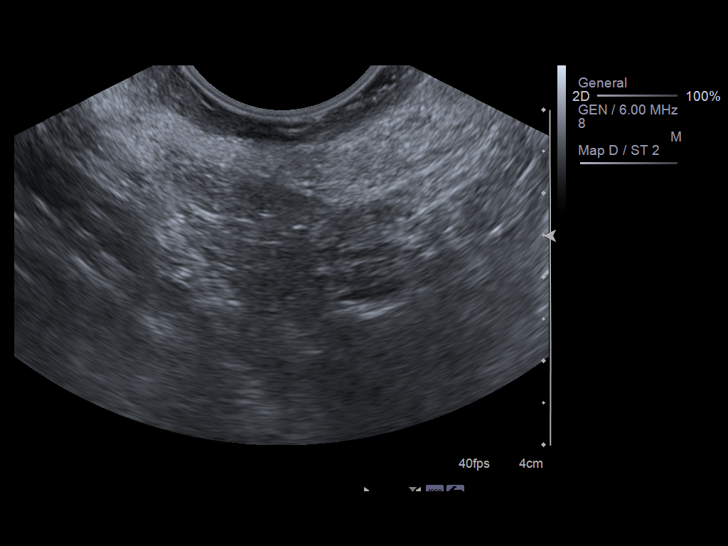
[im 55/74]
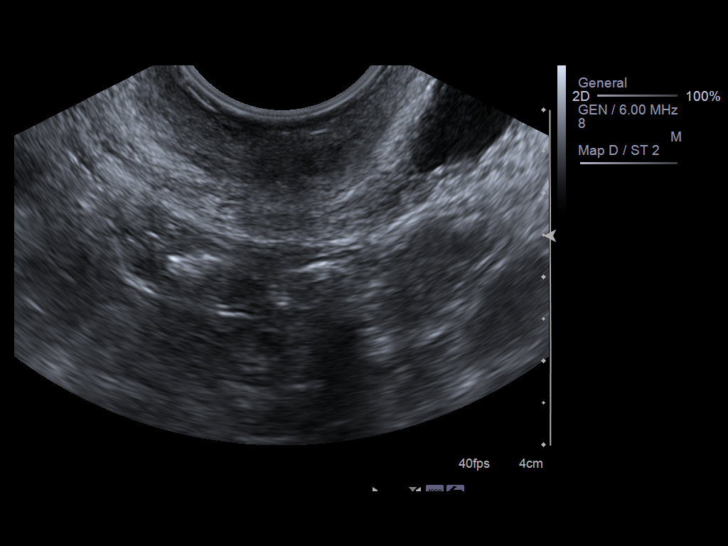
[im 61/74]
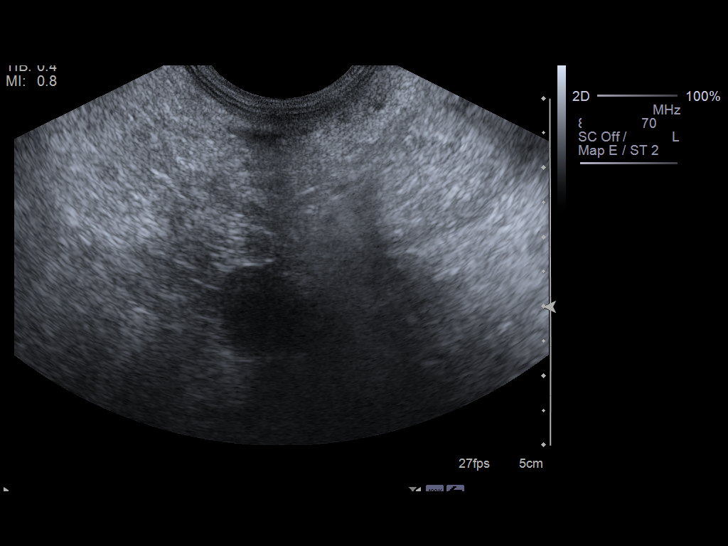
[im 67/74]
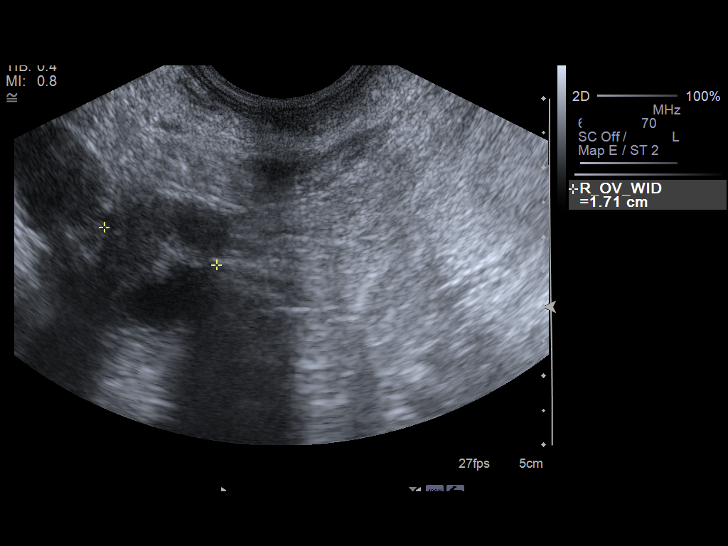
[im 74/74]
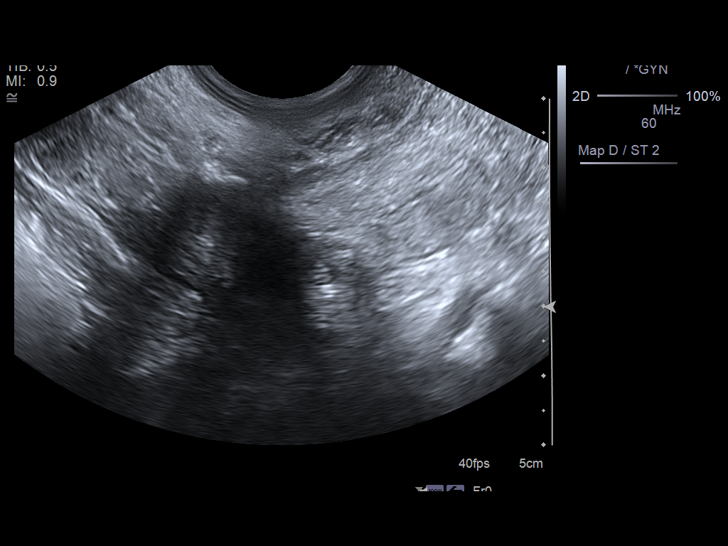

[14 of 25 positions shown; findings below may reference images not displayed]

PROCEDURE:     DON LOLITO - DON LOLITO PELVIS NON-OB W/TRANSVAGINAL  - [DATE]  [DATE]

RESULT:     Transabdominal and endovaginal ultrasound was performed. The
uterus is not seen compatible with prior hysterectomy. The left ovary also
has been removed. The right ovary measures 2.84 cm at maximum diameter. The
right ovarian cyst, observed on the prior CT of [DATE], is not evident on
the current exam. Incidental note is made of a few Nabothian cysts of the
cervix. The largest measures 1.45 cm in diameter. No free fluid is seen in
the pelvis. The kidneys are visualized bilaterally and show no
hydronephrosis. The visualized portion of the urinary bladder is normal in
appearance.
IMPRESSION: 1.  The patient is status post hysterectomy and left oophorectomy.
2.  The right ovarian cyst, observed previously at CT, is no longer seen.
3.  No abnormal adnexal masses are identified.
4.  There is no free fluid seen in the pelvis.
5.  A few Nabothian cysts of the cervix are observed with the largest
measuring 1.45 cm at maximum diameter.

[REDACTED]

## 2011-10-20 ENCOUNTER — Ambulatory Visit: Payer: Self-pay | Admitting: Gastroenterology

## 2011-10-23 LAB — PATHOLOGY REPORT

## 2011-12-06 ENCOUNTER — Ambulatory Visit: Payer: Self-pay | Admitting: Obstetrics and Gynecology

## 2012-11-29 ENCOUNTER — Ambulatory Visit: Payer: Self-pay | Admitting: Family Medicine

## 2012-11-29 IMAGING — CT CT CHEST W/ CM
1 series · 15 of 33 positions shown, 19 images · non-contrast
Comparison: none

REASON FOR EXAM: cough  abnormal CXR prominence on RT side of trachea
Radiology recommend CT...
COMMENTS:

[Series 2: chest w/ 3.0 i31f 2 · axial · 0.74mm/px · z∈[-548,-296]mm · 15 of 100 slices shown, 19 images]
[im 8/100  mediastinal]
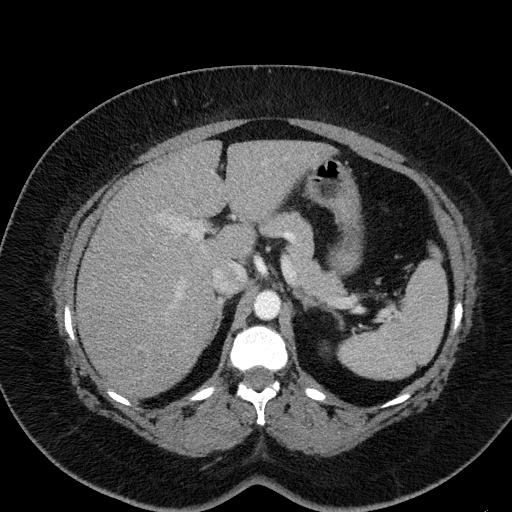
[im 8/100  lung]
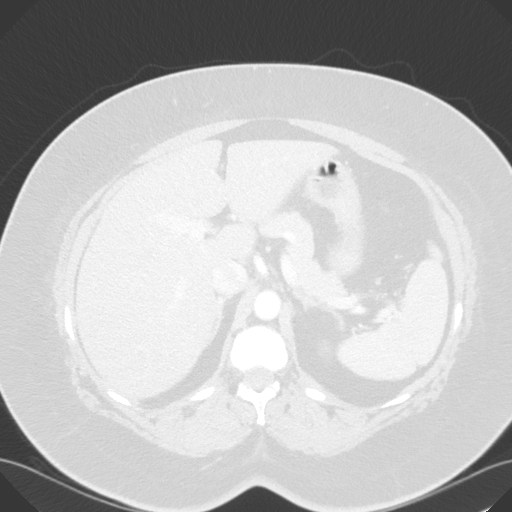
[im 15/100  lung]
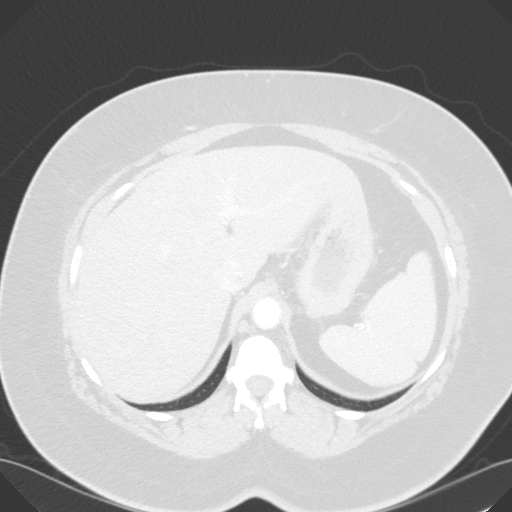
[im 20/100  lung]
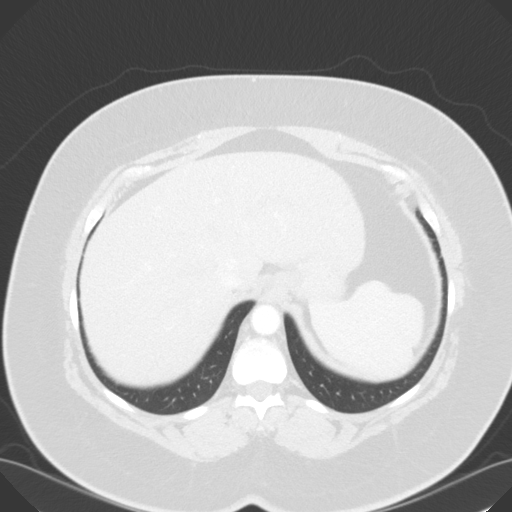
[im 26/100  lung]
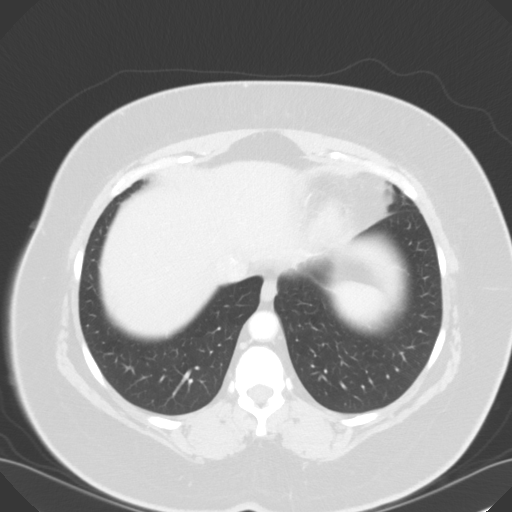
[im 34/100  mediastinal]
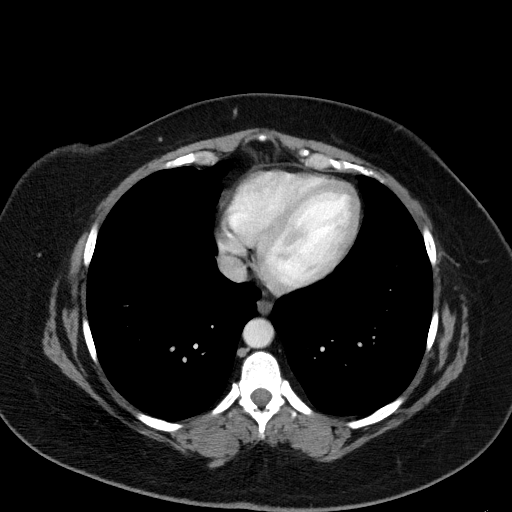
[im 34/100  lung]
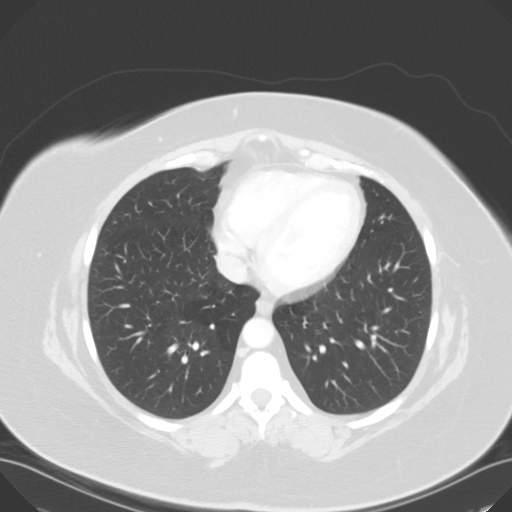
[im 40/100  lung]
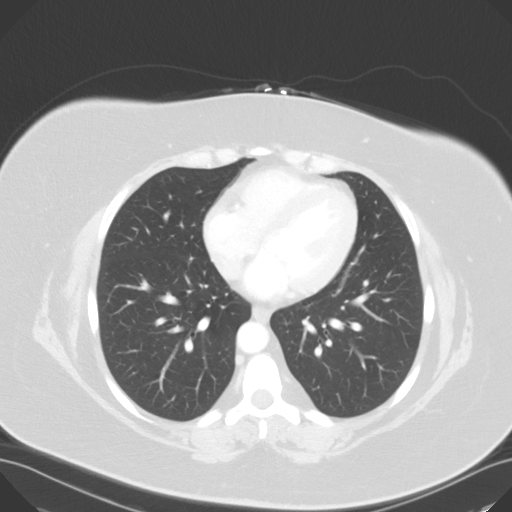
[im 45/100  lung]
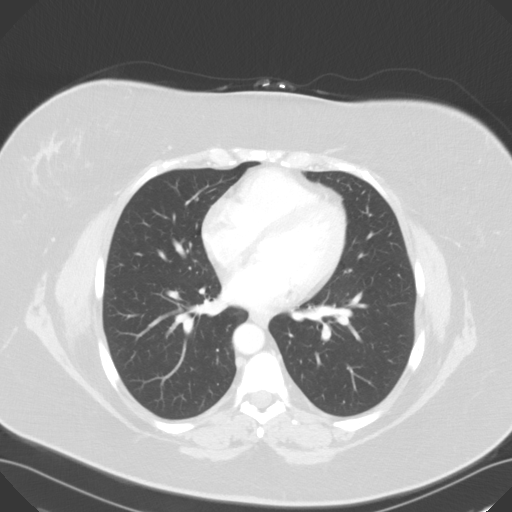
[im 52/100  lung]
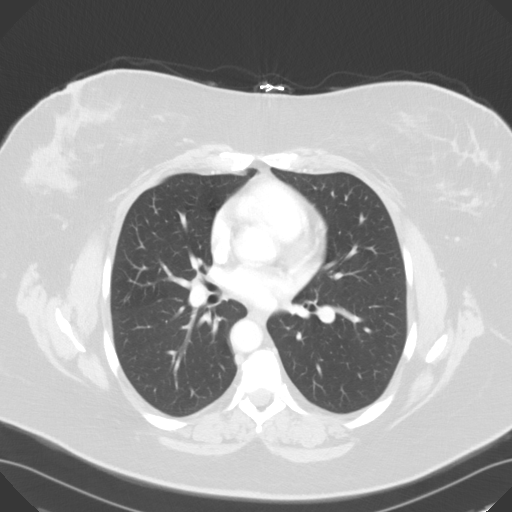
[im 56/100  mediastinal]
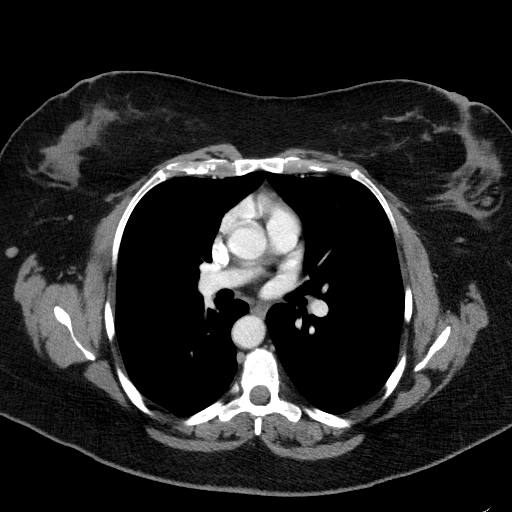
[im 56/100  lung]
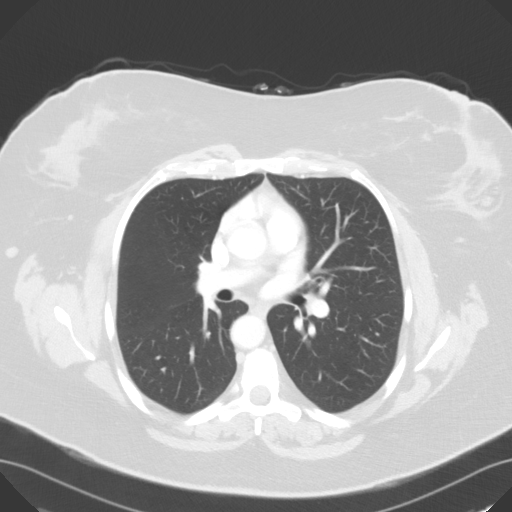
[im 60/100  lung]
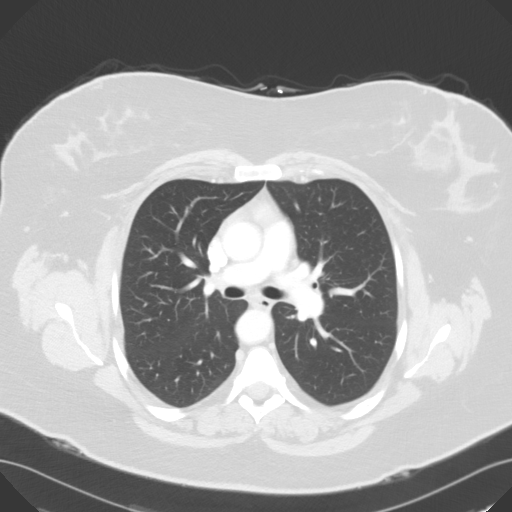
[im 67/100  lung]
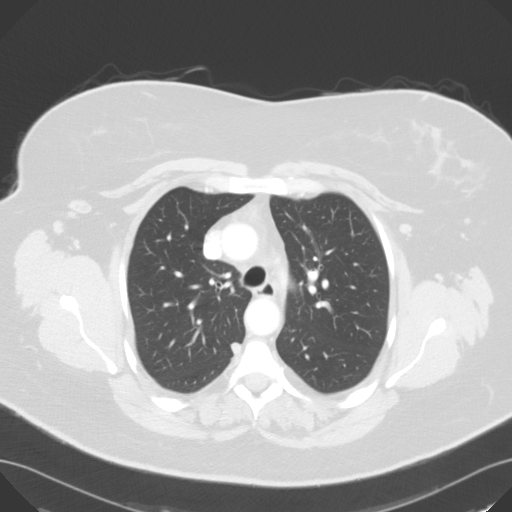
[im 74/100  lung]
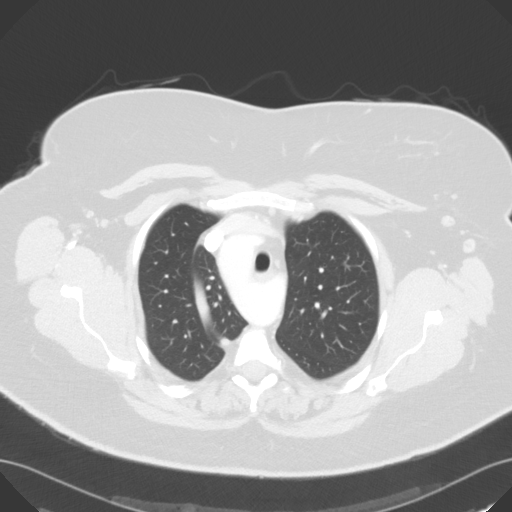
[im 80/100  mediastinal]
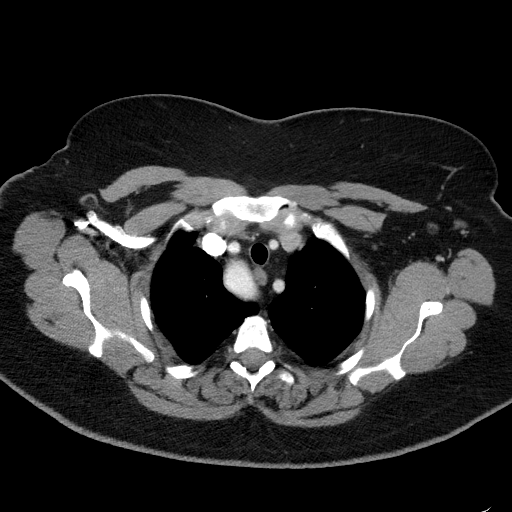
[im 80/100  lung]
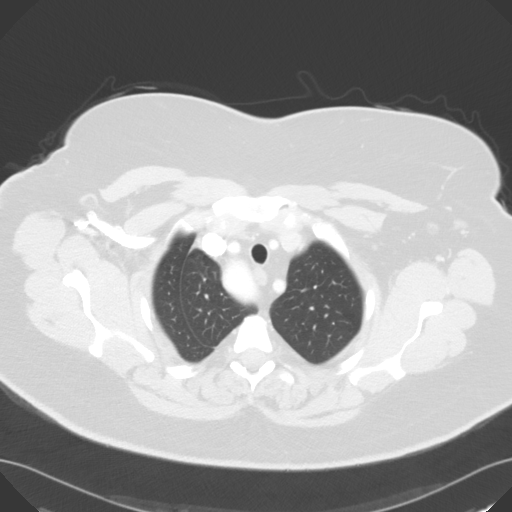
[im 85/100  lung]
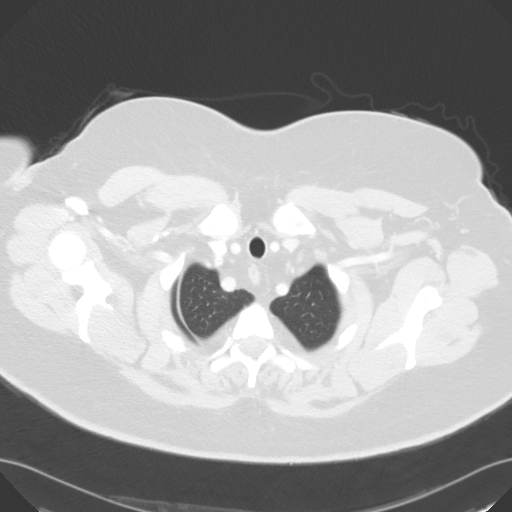
[im 92/100  lung]
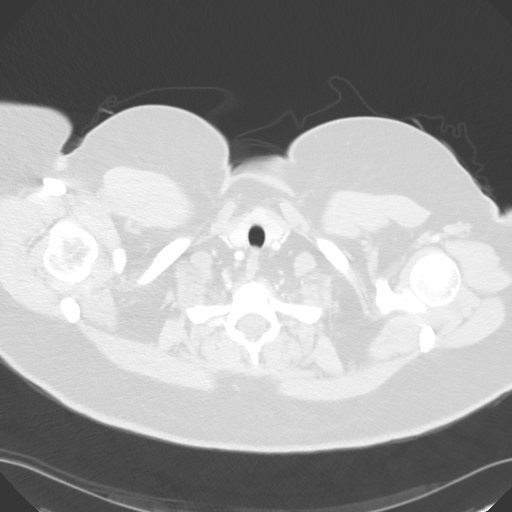

[15 of 33 positions shown; findings below may reference images not displayed]

PROCEDURE:     KCT - KCT CHEST WITH CONTRAST  - [DATE]  [DATE]

RESULT:     Axial CT scanning was performed through the chest with
reconstructions at 3 mm intervals and slice thicknesses. Review of
multiplanar reconstructed images was performed separately on the [employer]
monitor. The patient received 75 cc of [V6] for this study.

At lung window settings an azygos lobe anatomy on the right is present. The
lungs are well-expanded and clear. There is no infiltrate or atelectasis. No
pulmonary parenchymal masses are demonstrated. At mediastinal window
settings there is an aberrant origin of the left subclavian artery which is
a normal variant. The trachea is midline. The esophagus is of normal
caliber. The cardiac chambers are normal in size. The aortic arch is
right-sided and the descending aorta extends inferiorly along the midline to
enter the abdomen just to the left of midline. No pathologic sized
mediastinal or hilar lymph nodes are demonstrated. There are a few
borderline enlarged axillary lymph nodes. There is no pleural nor
pericardial effusion.

Within the upper abdomen the observed portions of the liver and spleen
appear normal. There are no adrenal masses.
IMPRESSION: 1. There is no evidence of pneumonia nor CHF or other acute cardiopulmonary
abnormality.
2. There is a an azygos lobe type anatomy of the right lung.
3. There is no mediastinal or hilar lymphadenopathy.
4. There is a right-sided aortic arch an aberrant origin of the left
subclavian artery.

[REDACTED]

## 2012-12-06 ENCOUNTER — Ambulatory Visit: Payer: Self-pay | Admitting: Obstetrics and Gynecology

## 2012-12-27 ENCOUNTER — Ambulatory Visit: Payer: Self-pay | Admitting: Family Medicine

## 2013-01-03 ENCOUNTER — Ambulatory Visit: Payer: Self-pay | Admitting: Family Medicine

## 2014-01-01 ENCOUNTER — Emergency Department: Payer: Self-pay | Admitting: Student

## 2014-01-01 LAB — CBC WITH DIFFERENTIAL/PLATELET
Basophil #: 0.1 10*3/uL (ref 0.0–0.1)
Basophil %: 0.9 %
EOS ABS: 0.2 10*3/uL (ref 0.0–0.7)
Eosinophil %: 2.7 %
HCT: 40.8 % (ref 35.0–47.0)
HGB: 13.7 g/dL (ref 12.0–16.0)
LYMPHS PCT: 23.4 %
Lymphocyte #: 2.1 10*3/uL (ref 1.0–3.6)
MCH: 29.8 pg (ref 26.0–34.0)
MCHC: 33.5 g/dL (ref 32.0–36.0)
MCV: 89 fL (ref 80–100)
MONOS PCT: 9.7 %
Monocyte #: 0.9 x10 3/mm (ref 0.2–0.9)
Neutrophil #: 5.6 10*3/uL (ref 1.4–6.5)
Neutrophil %: 63.3 %
PLATELETS: 333 10*3/uL (ref 150–440)
RBC: 4.58 10*6/uL (ref 3.80–5.20)
RDW: 13.8 % (ref 11.5–14.5)
WBC: 8.9 10*3/uL (ref 3.6–11.0)

## 2014-01-01 LAB — URINALYSIS, COMPLETE
BLOOD: NEGATIVE
Bilirubin,UR: NEGATIVE
GLUCOSE, UR: NEGATIVE mg/dL (ref 0–75)
Ketone: NEGATIVE
Nitrite: NEGATIVE
PH: 5 (ref 4.5–8.0)
Protein: NEGATIVE
SPECIFIC GRAVITY: 1.024 (ref 1.003–1.030)
Squamous Epithelial: 2

## 2014-01-01 LAB — COMPREHENSIVE METABOLIC PANEL
Albumin: 3.1 g/dL — ABNORMAL LOW (ref 3.4–5.0)
Alkaline Phosphatase: 42 U/L — ABNORMAL LOW
Anion Gap: 6 — ABNORMAL LOW (ref 7–16)
BILIRUBIN TOTAL: 0.5 mg/dL (ref 0.2–1.0)
BUN: 12 mg/dL (ref 7–18)
CREATININE: 0.5 mg/dL — AB (ref 0.60–1.30)
Calcium, Total: 7.6 mg/dL — ABNORMAL LOW (ref 8.5–10.1)
Chloride: 111 mmol/L — ABNORMAL HIGH (ref 98–107)
Co2: 24 mmol/L (ref 21–32)
EGFR (African American): >60
EGFR (Non-African Amer.): >60
Glucose: 75 mg/dL (ref 65–99)
Osmolality: 280 (ref 275–301)
POTASSIUM: 4.2 mmol/L (ref 3.5–5.1)
SGOT(AST): 27 U/L (ref 15–37)
SGPT (ALT): 22 U/L
Sodium: 141 mmol/L (ref 136–145)
Total Protein: 6.9 g/dL (ref 6.4–8.2)

## 2014-01-01 LAB — TROPONIN I

## 2014-01-01 LAB — LIPASE, BLOOD: Lipase: 444 U/L — ABNORMAL HIGH (ref 73–393)

## 2014-01-01 LAB — GC/CHLAMYDIA PROBE AMP

## 2014-01-01 LAB — WET PREP, GENITAL

## 2014-01-01 LAB — HCG, QUANTITATIVE, PREGNANCY: Beta Hcg, Quant.: <1 m[IU]/mL — ABNORMAL LOW

## 2014-04-03 DIAGNOSIS — C801 Malignant (primary) neoplasm, unspecified: Secondary | ICD-10-CM

## 2014-04-03 HISTORY — DX: Malignant (primary) neoplasm, unspecified: C80.1

## 2015-02-16 ENCOUNTER — Emergency Department: Payer: No Typology Code available for payment source

## 2015-02-16 ENCOUNTER — Observation Stay
Admission: EM | Admit: 2015-02-16 | Discharge: 2015-02-17 | Disposition: A | Discharge status: Home / Self Care | Payer: No Typology Code available for payment source | Attending: Internal Medicine | Admitting: Internal Medicine

## 2015-02-16 ENCOUNTER — Encounter: Payer: Self-pay | Admitting: *Deleted

## 2015-02-16 DIAGNOSIS — R0602 Shortness of breath: Secondary | ICD-10-CM | POA: Diagnosis not present

## 2015-02-16 DIAGNOSIS — Z8249 Family history of ischemic heart disease and other diseases of the circulatory system: Secondary | ICD-10-CM | POA: Insufficient documentation

## 2015-02-16 DIAGNOSIS — I471 Supraventricular tachycardia, unspecified: Secondary | ICD-10-CM | POA: Diagnosis present

## 2015-02-16 DIAGNOSIS — G4730 Sleep apnea, unspecified: Secondary | ICD-10-CM | POA: Insufficient documentation

## 2015-02-16 DIAGNOSIS — Z791 Long term (current) use of non-steroidal anti-inflammatories (NSAID): Secondary | ICD-10-CM | POA: Diagnosis not present

## 2015-02-16 DIAGNOSIS — R079 Chest pain, unspecified: Principal | ICD-10-CM | POA: Diagnosis present

## 2015-02-16 DIAGNOSIS — Z79899 Other long term (current) drug therapy: Secondary | ICD-10-CM | POA: Diagnosis not present

## 2015-02-16 DIAGNOSIS — M542 Cervicalgia: Secondary | ICD-10-CM | POA: Diagnosis not present

## 2015-02-16 DIAGNOSIS — K589 Irritable bowel syndrome without diarrhea: Secondary | ICD-10-CM | POA: Diagnosis not present

## 2015-02-16 DIAGNOSIS — I10 Essential (primary) hypertension: Secondary | ICD-10-CM | POA: Diagnosis not present

## 2015-02-16 DIAGNOSIS — R42 Dizziness and giddiness: Secondary | ICD-10-CM | POA: Diagnosis not present

## 2015-02-16 DIAGNOSIS — Q2547 Right aortic arch: Secondary | ICD-10-CM | POA: Insufficient documentation

## 2015-02-16 DIAGNOSIS — R7989 Other specified abnormal findings of blood chemistry: Secondary | ICD-10-CM | POA: Diagnosis present

## 2015-02-16 DIAGNOSIS — R748 Abnormal levels of other serum enzymes: Secondary | ICD-10-CM | POA: Diagnosis not present

## 2015-02-16 DIAGNOSIS — R778 Other specified abnormalities of plasma proteins: Secondary | ICD-10-CM | POA: Diagnosis present

## 2015-02-16 HISTORY — DX: Essential (primary) hypertension: I10

## 2015-02-16 HISTORY — DX: Supraventricular tachycardia, unspecified: I47.10

## 2015-02-16 HISTORY — DX: Irritable bowel syndrome, unspecified: K58.9

## 2015-02-16 HISTORY — DX: Sleep apnea, unspecified: G47.30

## 2015-02-16 HISTORY — DX: Supraventricular tachycardia: I47.1

## 2015-02-16 HISTORY — DX: Diverticulitis of intestine, part unspecified, without perforation or abscess without bleeding: K57.92

## 2015-02-16 LAB — COMPREHENSIVE METABOLIC PANEL
ALBUMIN: 3.8 g/dL (ref 3.5–5.0)
ALT: 14 U/L (ref 14–54)
AST: 14 U/L — AB (ref 15–41)
Alkaline Phosphatase: 43 U/L (ref 38–126)
Anion gap: 6 (ref 5–15)
BUN: 15 mg/dL (ref 6–20)
CHLORIDE: 106 mmol/L (ref 101–111)
CO2: 29 mmol/L (ref 22–32)
CREATININE: 0.67 mg/dL (ref 0.44–1.00)
Calcium: 9 mg/dL (ref 8.9–10.3)
GFR calc non Af Amer: >60 mL/min (ref >60)
GLUCOSE: 116 mg/dL — AB (ref 65–99)
Potassium: 3.7 mmol/L (ref 3.5–5.1)
SODIUM: 141 mmol/L (ref 135–145)
Total Bilirubin: 0.7 mg/dL (ref 0.3–1.2)
Total Protein: 7.8 g/dL (ref 6.5–8.1)

## 2015-02-16 LAB — CBC
HCT: 40.1 % (ref 35.0–47.0)
HEMOGLOBIN: 13.3 g/dL (ref 12.0–16.0)
MCH: 29.3 pg (ref 26.0–34.0)
MCHC: 33.2 g/dL (ref 32.0–36.0)
MCV: 88.1 fL (ref 80.0–100.0)
PLATELETS: 367 10*3/uL (ref 150–440)
RBC: 4.56 MIL/uL (ref 3.80–5.20)
RDW: 13.8 % (ref 11.5–14.5)
WBC: 11.1 10*3/uL — AB (ref 3.6–11.0)

## 2015-02-16 LAB — FIBRIN DERIVATIVES D-DIMER (ARMC ONLY): FIBRIN DERIVATIVES D-DIMER (ARMC): 333 (ref 0–499)

## 2015-02-16 LAB — TROPONIN I: Troponin I: 0.05 ng/mL — ABNORMAL HIGH (ref <0.031)

## 2015-02-16 IMAGING — CR DG CHEST 2V
1 series · 2 of 2 positions shown · non-contrast
Comparison: CT [DATE]

CLINICAL DATA: Mid chest pain radiating to back since 5 am. Light
headed. History of HTN. shielded

EXAM:
CHEST - 2 VIEW

[Series 1: dg chest 2 view · 0.14mm/px · 2 of 2 slices shown]
[im 1/2]
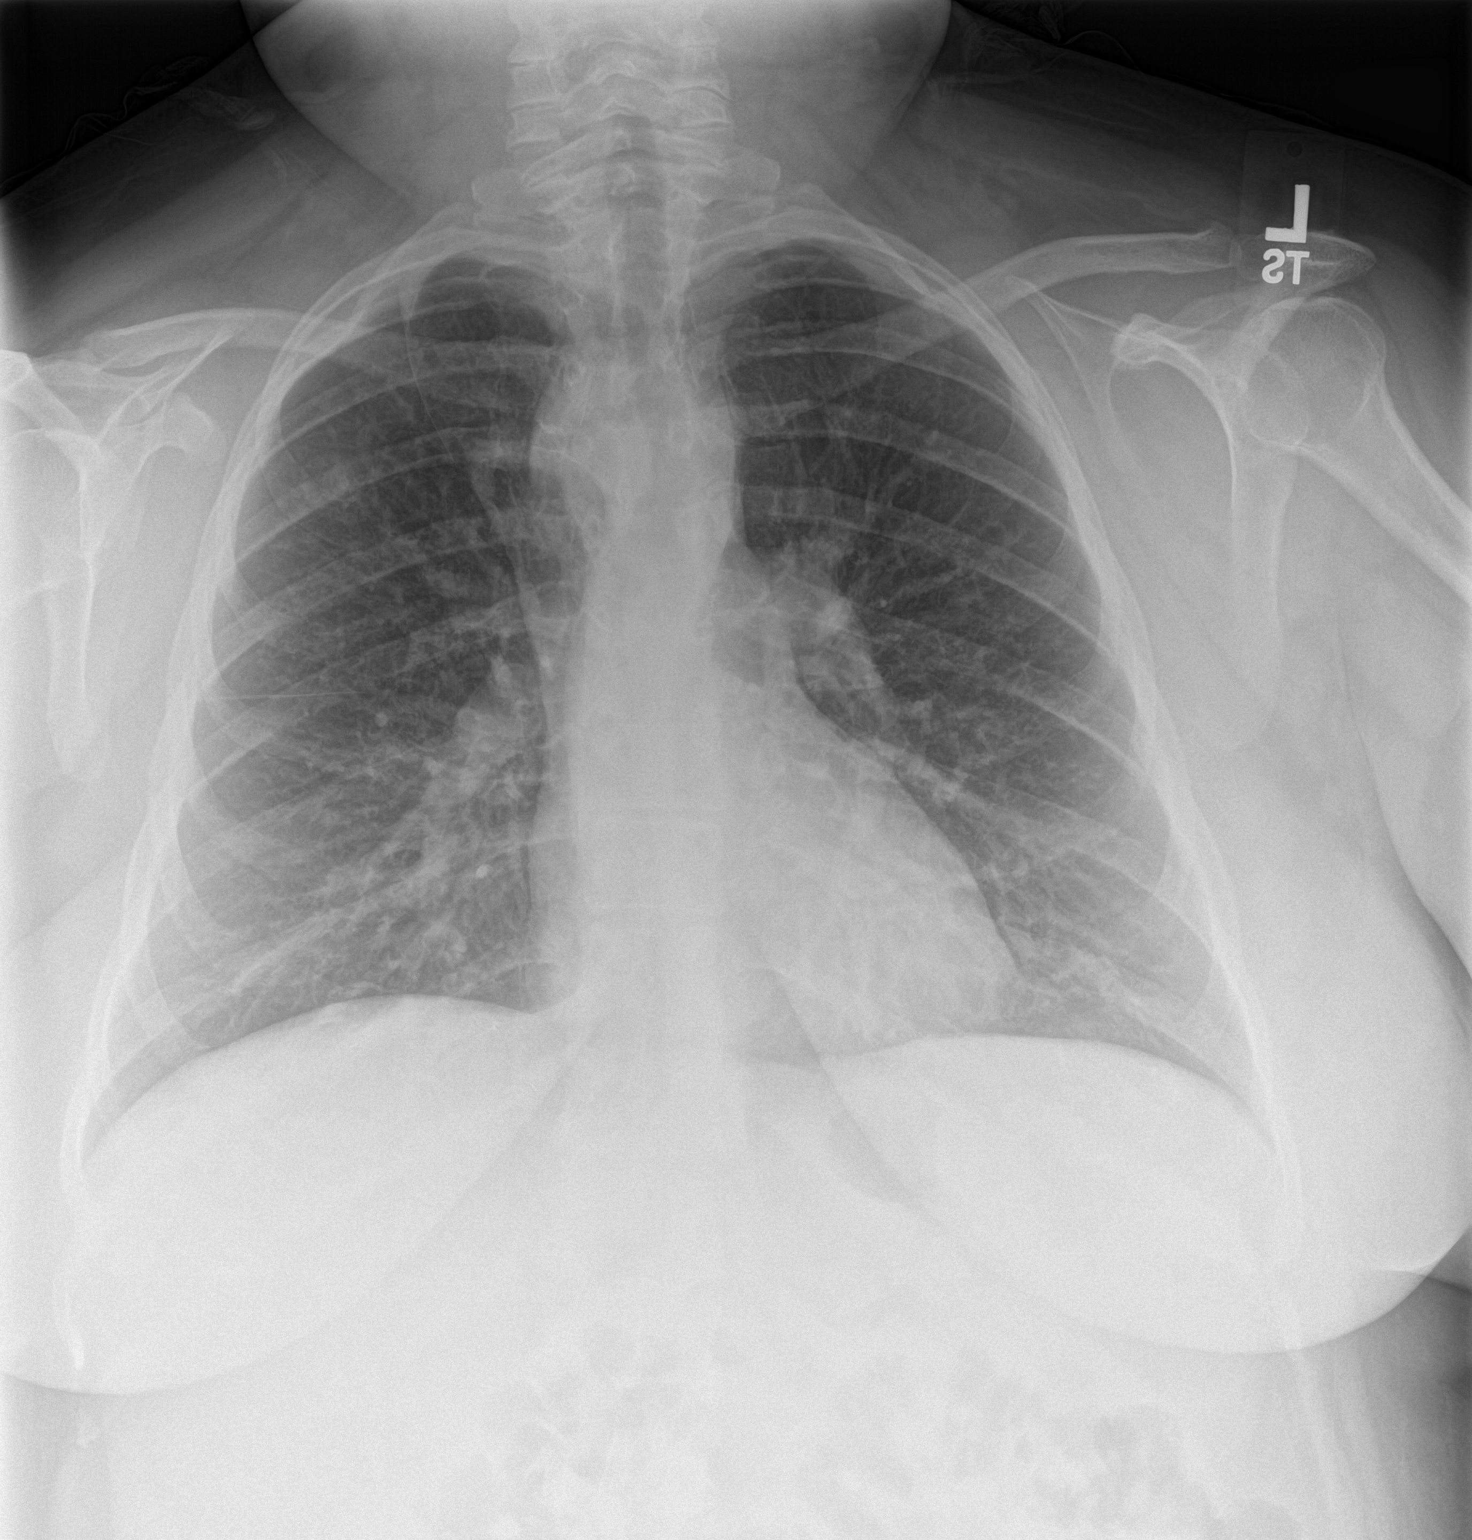
[im 2/2]
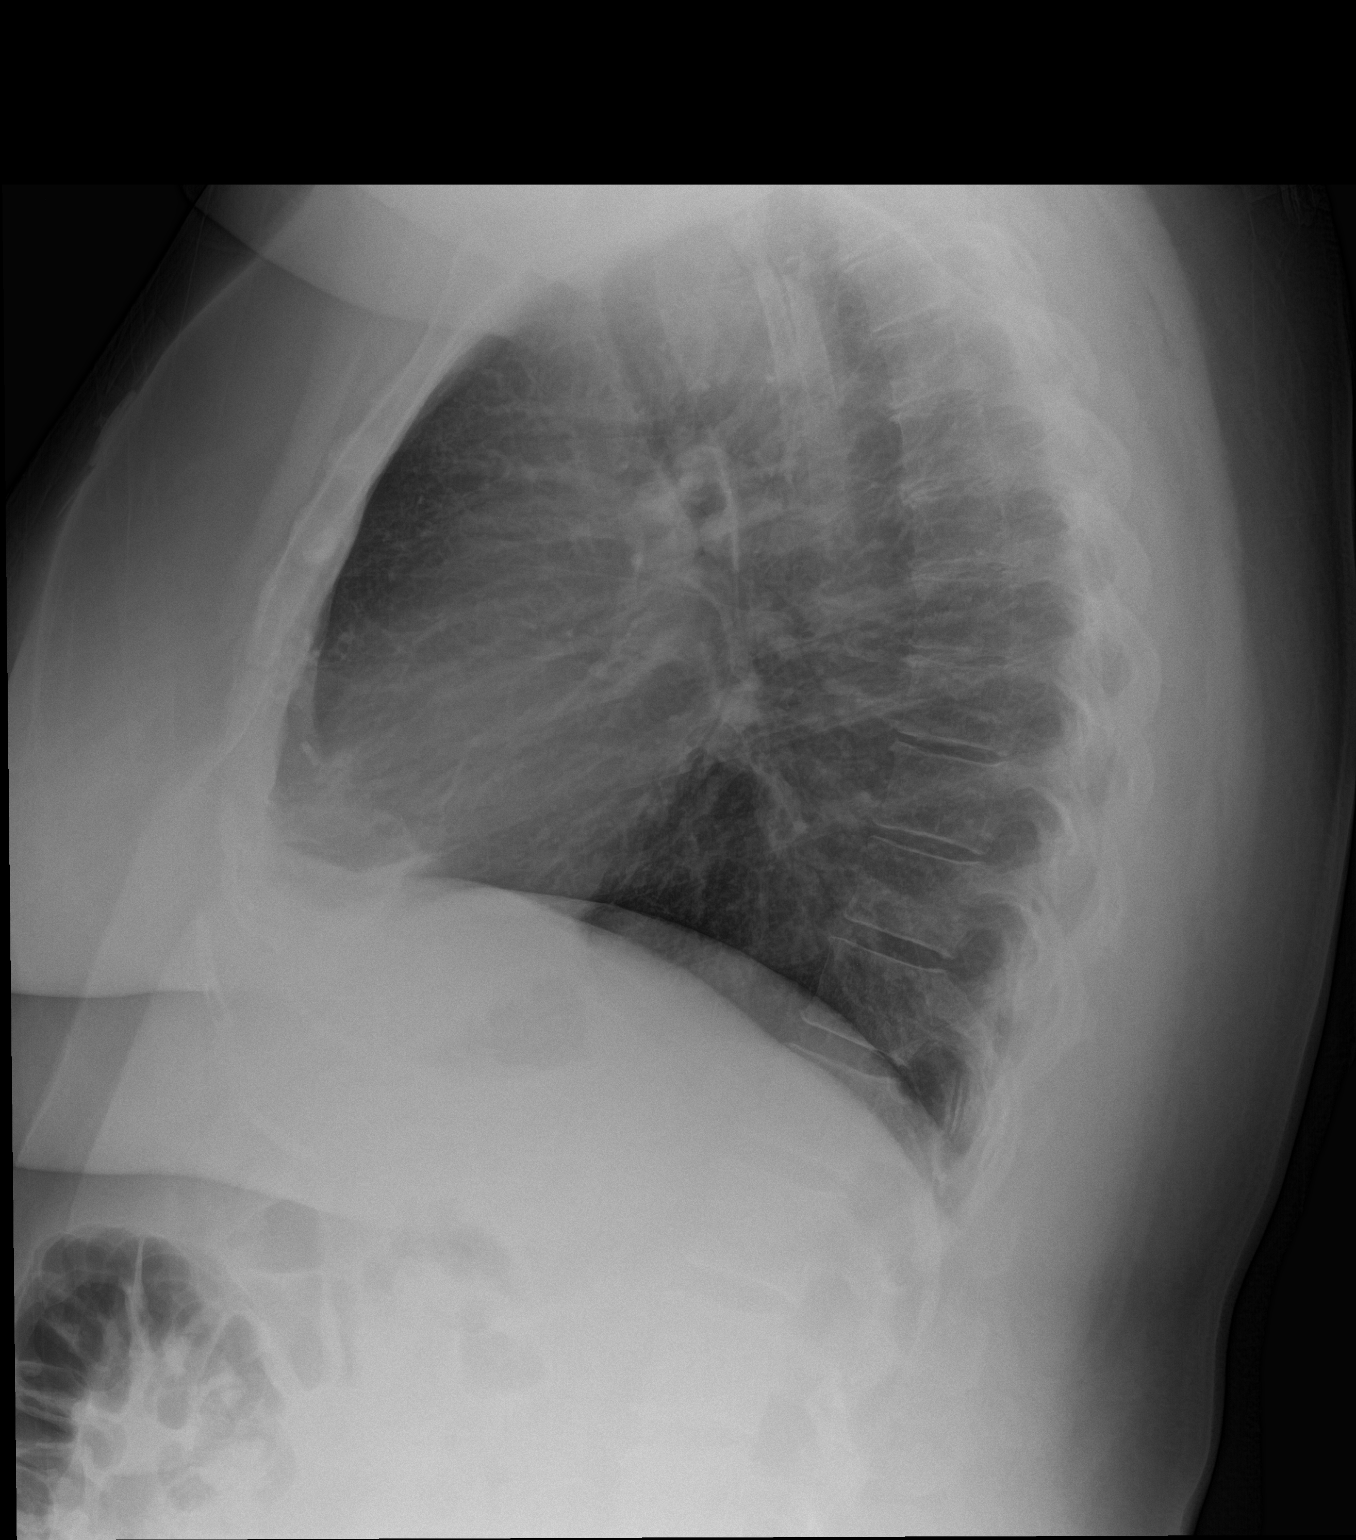

[2 of 2 positions shown; findings below may reference images not displayed]

FINDINGS: Lungs are clear. Heart size within normal limits. Right paratracheal
density consistent with known right aortic arch.
No effusion.  No pneumothorax.  Azygos fissure.
Visualized skeletal structures are unremarkable.
IMPRESSION: No acute cardiopulmonary disease.

## 2015-02-16 MED ORDER — ONDANSETRON HCL 4 MG/2ML IJ SOLN: 4.0000 mg | Freq: Four times a day (QID) | INTRAMUSCULAR | Status: DC | PRN

## 2015-02-16 MED ORDER — ENOXAPARIN SODIUM 40 MG/0.4ML ~~LOC~~ SOLN
40.0000 mg | Freq: Two times a day (BID) | SUBCUTANEOUS | Status: DC
  Administered 2015-02-17: 40 mg via SUBCUTANEOUS
  Filled 2015-02-16: qty 0.4

## 2015-02-16 MED ORDER — ONDANSETRON HCL 4 MG PO TABS: 4.0000 mg | ORAL_TABLET | Freq: Four times a day (QID) | ORAL | Status: DC | PRN

## 2015-02-16 MED ORDER — TRAMADOL HCL 50 MG PO TABS
50.0000 mg | ORAL_TABLET | Freq: Four times a day (QID) | ORAL | Status: DC | PRN
  Administered 2015-02-16: 50 mg via ORAL
  Filled 2015-02-16: qty 1

## 2015-02-16 MED ORDER — NITROGLYCERIN 0.4 MG SL SUBL
0.4000 mg | SUBLINGUAL_TABLET | SUBLINGUAL | Status: DC | PRN
  Administered 2015-02-16 (×2): 0.4 mg via SUBLINGUAL
  Filled 2015-02-16: qty 1

## 2015-02-16 MED ORDER — MORPHINE SULFATE (PF) 4 MG/ML IV SOLN
4.0000 mg | Freq: Once | INTRAVENOUS | Status: AC
  Administered 2015-02-16: 4 mg via INTRAVENOUS
  Filled 2015-02-16: qty 1

## 2015-02-16 MED ORDER — ASPIRIN 81 MG PO CHEW
324.0000 mg | CHEWABLE_TABLET | Freq: Once | ORAL | Status: AC
  Administered 2015-02-16: 324 mg via ORAL
  Filled 2015-02-16: qty 4

## 2015-02-16 MED ORDER — CARVEDILOL 3.125 MG PO TABS
3.1250 mg | ORAL_TABLET | Freq: Two times a day (BID) | ORAL | Status: DC
  Administered 2015-02-16 – 2015-02-17 (×2): 3.125 mg via ORAL
  Filled 2015-02-16 (×2): qty 1

## 2015-02-16 MED ORDER — SODIUM CHLORIDE 0.9 % IJ SOLN
3.0000 mL | Freq: Two times a day (BID) | INTRAMUSCULAR | Status: DC
  Administered 2015-02-16 – 2015-02-17 (×2): 3 mL via INTRAVENOUS

## 2015-02-16 MED ORDER — ONDANSETRON HCL 4 MG/2ML IJ SOLN
4.0000 mg | Freq: Once | INTRAMUSCULAR | Status: AC
  Administered 2015-02-16: 4 mg via INTRAVENOUS
  Filled 2015-02-16: qty 2

## 2015-02-16 NOTE — ED Notes (Signed)
EDP at bedside  
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
## 2015-02-16 NOTE — ED Notes (Signed)
Patient returned from xray.

## 2015-02-16 NOTE — ED Notes (Signed)
Pt has intermittent chest pain that began this morning.  No n/v/d.  No diaphoresis.  Pt reports pain radiates into mid back.  No sob.  Nonsmoker.  Alert.  Skin warm and dry.

## 2015-02-16 NOTE — ED Provider Notes (Addendum)
[facility] Emergency Department Provider Note  ____________________________________________  Time seen: Approximately 615 PM  I have reviewed the triage vital signs and the nursing notes.   HISTORY  Chief Complaint Chest Pain    HPI Ashlee Everett is a 43 y.o. female with a history of hypertension and a family history of coronary artery disease was presenting today with chest pain. Says that the chest pain started early this morning and has an waxing and waning throughout the day. She describes as midsternal and radiating to her back. She also describes some mild shortness of breath. Does not have any increased pain with deep breathing. Does not have any increased pain with walking. Says that her father had a heart attack in his 64s. Has not taken any aspirin yet today. Patient does take estrogen supplements. Reports the pain as a 7 out of 10 at this time. The quality of the pain as a burning squeezing pain.   No past medical history on file.  There are no active problems to display for this patient.   No past surgical history on file.  No current outpatient prescriptions on file.  Allergies Fioricet  No family history on file.  Social History Social History  Substance Use Topics  . Smoking status: Never Smoker   . Smokeless tobacco: None  . Alcohol Use: No    Review of Systems Constitutional: No fever/chills Eyes: No visual changes. ENT: No sore throat. Cardiovascular: As above  Respiratory: As above Gastrointestinal: No abdominal pain.  No nausea, no vomiting.  No diarrhea.  No constipation. Genitourinary: Negative for dysuria. Musculoskeletal: Negative for back pain. Skin: Negative for rash. Neurological: Negative for headaches, focal weakness or numbness.  10-point ROS otherwise negative.  ____________________________________________   PHYSICAL EXAM:  VITAL SIGNS: ED Triage Vitals  Enc Vitals Group     BP 02/16/15 1634  157/76 mmHg     Pulse Rate 02/16/15 1634 95     Resp 02/16/15 1634 18     Temp 02/16/15 1634 98.6 F (37 C)     Temp Source 02/16/15 1634 Oral     SpO2 02/16/15 1634 99 %     Weight 02/16/15 1634 254 lb (115.214 kg)     Height 02/16/15 1634 5\' 1"  (1.549 m)     Head Cir --      Peak Flow --      Pain Score 02/16/15 1635 6     Pain Loc --      Pain Edu? --      Excl. in [place]? --     Constitutional: Alert and oriented. Well appearing and in no acute distress. Eyes: Conjunctivae are normal. PERRL. EOMI. Head: Atraumatic. Nose: No congestion/rhinnorhea. Mouth/Throat: Mucous membranes are moist.   Neck: No stridor.   Cardiovascular: Normal rate, regular rhythm. Grossly normal heart sounds.  Good peripheral circulation with intact bilateral dorsalis pedis as well as radial pulses. Respiratory: Normal respiratory effort.  No retractions. Lungs CTAB. Gastrointestinal: Soft and nontender. No distention. No abdominal bruits. No CVA tenderness. Musculoskeletal: No lower extremity tenderness nor edema.  No joint effusions. Neurologic:  Normal speech and language. No gross focal neurologic deficits are appreciated. No gait instability. Skin:  Skin is warm, dry and intact. No rash noted. Psychiatric: Mood and affect are normal. Speech and behavior are normal.  ____________________________________________   LABS (all labs ordered are listed, but only abnormal results are displayed)  Labs Reviewed  CBC - Abnormal; Notable for the following:  WBC 11.1 (*)    All other components within normal limits  COMPREHENSIVE METABOLIC PANEL - Abnormal; Notable for the following:    Glucose, Bld 116 (*)    AST 14 (*)    All other components within normal limits  TROPONIN I - Abnormal; Notable for the following:    Troponin I 0.05 (*)    All other components within normal limits   ____________________________________________  EKG  ED ECG REPORT I, Doran Stabler, the attending physician,  personally viewed and interpreted this ECG.   Date: 02/16/2015  EKG Time: 1632  Rate: 92  Rhythm: normal sinus rhythm  Axis: Normal axis  Intervals:none  ST&T Change: No ST segment elevation or depression. No abnormal T-wave inversions.  ____________________________________________  RADIOLOGY  No acute cardiopulmonary disease on the chest x-ray. ____________________________________________   PROCEDURES  CRITICAL CARE Performed by: Doran Stabler   Total critical care time: 35 minutes  Critical care time was exclusive of separately billable procedures and treating other patients.  Critical care was necessary to treat or prevent imminent or life-threatening deterioration.  Critical care was time spent personally by me on the following activities: development of treatment plan with patient and/or surrogate as well as nursing, discussions with consultants, evaluation of patient's response to treatment, examination of patient, obtaining history from patient or surrogate, ordering and performing treatments and interventions, ordering and review of laboratory studies, ordering and review of radiographic studies, pulse oximetry and re-evaluation of patient's condition.  For elevated troponin with chest pain. ____________________________________________   INITIAL IMPRESSION / ASSESSMENT AND PLAN / ED COURSE  Pertinent labs & imaging results that were available during my care of the patient were reviewed by me and considered in my medical decision making (see chart for details).  ----------------------------------------- 8:33 PM on 02/16/2015 -----------------------------------------  Patient says pain is still a 6 out of 10 after nitroglycerin. Given 4 mg of morphine and now says is a 5 out of 10. However is resting comfortably and does not want any more pain medication at this time. Signed out to Dr. Jannifer Franklin for [place] to the hospital. The patient understands the plan for  Mr. hospital as well as her lab results and the need for further workup because of her positive troponin. Since troponin is borderline at this time we'll hold on further anticoagulation especially given that the patient is resting comfortably at this time. ____________________________________________   FINAL CLINICAL IMPRESSION(S) / ED DIAGNOSES  Chest pain.    Orbie Pyo, MD 02/16/15 2034  Dr. Jannifer Franklin to follow with d-dimer.  Orbie Pyo, MD 02/16/15 [phone]

## 2015-02-16 NOTE — H&P (Signed)
[place] at [place] NAME: Ashlee Everett    MR#:  [id number]  DATE OF BIRTH:  02/13/1971  DATE OF ADMISSION:  02/16/2015  PRIMARY CARE PHYSICIAN: No primary care provider on file.   REQUESTING/REFERRING PHYSICIAN: Clearnce Hasten, M.D.  CHIEF COMPLAINT:   Chief Complaint  Patient presents with  . Chest Pain    HISTORY OF PRESENT ILLNESS:  Ashlee Everett  is a 44 y.o. female who presents with chest pain. Patient states that the pain is substernal, sharp in nature, and has been intermittent throughout the day. She does not associated with exertion, nor does she associated improvement with rest. She does not report any significant true radiation of this pain, although she does state that at some point during the day she had some left-sided shoulder and neck pain, though this was not concurrent with her chest pain. She states that she has had one or 2 episodes of some shortness of breath today, but denies any true diaphoresis. Denies any abdominal pain, nausea, vomiting, dizziness, blurred vision. Her father had a heart attack in his 87s, but she has no known history of heart disease. On evaluation in the ED she did have positive troponin 0.05. Other lab work largely benign. D-dimer still pending with the thought of possible PE, despite her denying any true symptoms of DVT or PE. This is ordered because she is on hormonal contraceptive medication. Hospitalists were called for admission.  PAST MEDICAL HISTORY:   Past Medical History  Diagnosis Date  . Diverticulitis   . IBS (irritable bowel syndrome)   . Sleep apnea   . Paroxysmal SVT (supraventricular tachycardia) ([place])   . Hypertension     PAST SURGICAL HISTORY:   Past Surgical History  Procedure Laterality Date  . Tubal ligation    . Abdominal hysterectomy    . Sigmoid resection / rectopexy    . Appendectomy    . Salpingoophorectomy Left   . Laparoscopic lysis of adhesions       SOCIAL HISTORY:   Social History  Substance Use Topics  . Smoking status: Never Smoker   . Smokeless tobacco: Not on file  . Alcohol Use: No    FAMILY HISTORY:   Family History  Problem Relation Age of Onset  . Hypertension Mother   . Diabetes Mother   . Lung cancer Father   . Heart attack Father     DRUG ALLERGIES:   Allergies  Allergen Reactions  . Fioricet [Butalbital-Apap-Caffeine] Shortness Of Breath and Nausea And Vomiting    MEDICATIONS AT HOME:   Prior to Admission medications   Medication Sig Start Date End Date Taking? Authorizing Provider  carvedilol (COREG) 3.125 MG tablet Take 3.125 mg by mouth 2 (two) times daily.   Yes Historical Provider, MD  estradiol (ESTRACE) 0.5 MG tablet Take 0.5 mg by mouth every other day.    Yes Historical Provider, MD  ibuprofen (ADVIL,MOTRIN) 200 MG tablet Take 400 mg by mouth every 6 (six) hours as needed for mild pain.   Yes Historical Provider, MD    REVIEW OF SYSTEMS:  Review of Systems  Constitutional: Negative for fever, chills, weight loss and malaise/fatigue.  HENT: Negative for ear pain, hearing loss and tinnitus.   Eyes: Negative for blurred vision, double vision, pain and redness.  Respiratory: Positive for shortness of breath. Negative for cough and hemoptysis.   Cardiovascular: Positive for chest pain. Negative for palpitations, orthopnea and leg swelling.  Gastrointestinal: Negative for  nausea, vomiting, abdominal pain, diarrhea and constipation.  Genitourinary: Negative for dysuria, frequency and hematuria.  Musculoskeletal: Negative for back pain, joint pain and neck pain.  Skin:       No acne, rash, or lesions  Neurological: Negative for dizziness, tremors, focal weakness and weakness.  Endo/Heme/Allergies: Negative for polydipsia. Does not bruise/bleed easily.  Psychiatric/Behavioral: Negative for depression. The patient is not nervous/anxious and does not have insomnia.      VITAL SIGNS:   Filed  Vitals:   02/16/15 1930 02/16/15 1953 02/16/15 2000 02/16/15 2030  BP: 141/80 141/80 138/80 151/72  Pulse: 94 83 89 88  Temp:  98.6 F (37 C)    TempSrc:  Oral    Resp: 21 19 16 21   Height:      Weight:      SpO2: 96% 97% 95% 95%   Wt Readings from Last 3 Encounters:  02/16/15 115.214 kg (254 lb)    PHYSICAL EXAMINATION:  Physical Exam  Vitals reviewed. Constitutional: She is oriented to person, place, and time. She appears well-developed and well-nourished. No distress.  HENT:  Head: Normocephalic and atraumatic.  Mouth/Throat: Oropharynx is clear and moist.  Eyes: Conjunctivae and EOM are normal. Pupils are equal, round, and reactive to light. No scleral icterus.  Neck: Normal range of motion. Neck supple. No JVD present. No thyromegaly present.  Cardiovascular: Normal rate, regular rhythm and intact distal pulses.  Exam reveals no gallop and no friction rub.   No murmur heard. Respiratory: Effort normal and breath sounds normal. No respiratory distress. She has no wheezes. She has no rales.  GI: Soft. Bowel sounds are normal. She exhibits no distension. There is no tenderness.  Musculoskeletal: Normal range of motion. She exhibits no edema.  No arthritis, no gout  Lymphadenopathy:    She has no cervical adenopathy.  Neurological: She is alert and oriented to person, place, and time. No cranial nerve deficit.  No dysarthria, no aphasia  Skin: Skin is warm and dry. No rash noted. No erythema.  Psychiatric: She has a normal mood and affect. Her behavior is normal. Judgment and thought content normal.    LABORATORY PANEL:   CBC  Recent Labs Lab 02/16/15 1637  WBC 11.1*  HGB 13.3  HCT 40.1  PLT 367   ------------------------------------------------------------------------------------------------------------------  Chemistries   Recent Labs Lab 02/16/15 1637  NA 141  K 3.7  CL 106  CO2 29  GLUCOSE 116*  BUN 15  CREATININE 0.67  CALCIUM 9.0  AST 14*  ALT  14  ALKPHOS 43  BILITOT 0.7   ------------------------------------------------------------------------------------------------------------------  Cardiac Enzymes  Recent Labs Lab 02/16/15 1637  TROPONINI 0.05*   ------------------------------------------------------------------------------------------------------------------  RADIOLOGY:  Dg Chest 2 View  02/16/2015  CLINICAL DATA:  Mid chest pain radiating to back since 5 am. Light headed. History of HTN. shielded EXAM: CHEST - 2 VIEW COMPARISON:  CT 11/29/2012 FINDINGS: Lungs are clear. Heart size within normal limits. Right paratracheal density consistent with known right aortic arch. No effusion.  No pneumothorax.  Azygos fissure. Visualized skeletal structures are unremarkable. IMPRESSION: No acute cardiopulmonary disease. Electronically Signed   By: Lucrezia Europe M.D.   On: 02/16/2015 19:28    EKG:   Orders placed or performed during the hospital encounter of 02/16/15  . EKG 12-Lead  . EKG 12-Lead  . ED EKG  . ED EKG    IMPRESSION AND PLAN:  Principal Problem:   Chest pain - we'll rule out ACS, trend serial cardiac enzymes,  monitor tonight on telemetry. We'll also follow-up on her d-dimer, if it's positive will get a CT chest to rule out PE, though as stated above the suspicion is low. Active Problems:   Elevated troponin - trend enzymes tonight as above. Unclear the significance of her elevated enzymes was only mildly elevated, however she did have a truly negative cardiac enzyme just last year.   Hypertension - currently controlled, continue home meds   Paroxysmal SVT (supraventricular tachycardia) (HCC) - currently in sinus rhythm, continue home rate controlling medications  All the records are reviewed and case discussed with ED provider. Management plans discussed with the patient and/or family.  DVT PROPHYLAXIS: SubQ lovenox  ADMISSION STATUS: Observation  CODE STATUS: Full  TOTAL TIME TAKING CARE OF THIS  PATIENT: 45 minutes.    Raedyn Wenke FIELDING 02/16/2015, 9:20 PM  [employer]  Office  [phone]  CC: Primary care physician; No primary care provider on file.

## 2015-02-17 ENCOUNTER — Encounter: Payer: Self-pay | Admitting: Radiology

## 2015-02-17 ENCOUNTER — Observation Stay (HOSPITAL_BASED_OUTPATIENT_CLINIC_OR_DEPARTMENT_OTHER): Payer: No Typology Code available for payment source

## 2015-02-17 DIAGNOSIS — R079 Chest pain, unspecified: Secondary | ICD-10-CM | POA: Diagnosis not present

## 2015-02-17 DIAGNOSIS — R7989 Other specified abnormal findings of blood chemistry: Secondary | ICD-10-CM | POA: Diagnosis not present

## 2015-02-17 DIAGNOSIS — R0789 Other chest pain: Secondary | ICD-10-CM

## 2015-02-17 LAB — NM MYOCAR MULTI W/SPECT W/WALL MOTION / EF
CHL CUP NUCLEAR SRS: 4
CHL CUP NUCLEAR SSS: 1
CHL CUP STRESS STAGE 1 HR: 95 {beats}/min
CHL CUP STRESS STAGE 2 SPEED: 0 mph
CHL CUP STRESS STAGE 3 GRADE: 0 %
CHL CUP STRESS STAGE 3 HR: 99 {beats}/min
CHL CUP STRESS STAGE 3 SPEED: 0 mph
CHL CUP STRESS STAGE 4 HR: 118 {beats}/min
CHL CUP STRESS STAGE 5 GRADE: 0 %
CHL CUP STRESS STAGE 6 DBP: 86 mmHg
CHL CUP STRESS STAGE 6 SPEED: 1.7 mph
CHL CUP STRESS STAGE 8 HR: 129 {beats}/min
CHL CUP STRESS STAGE 8 SPEED: 0 mph
CSEPHR: 87 %
CSEPPMHR: 86 %
Estimated workload: 4.6 METS
Exercise duration (min): 3 min
LVDIAVOL: 105 mL
LVSYSVOL: 27 mL
NUC STRESS TID: 0.83
Peak HR: 151 {beats}/min
Rest HR: 107 {beats}/min
SDS: 0
Stage 2 Grade: 0 %
Stage 2 HR: 97 {beats}/min
Stage 4 Grade: 0 %
Stage 4 Speed: 1 mph
Stage 5 HR: 118 {beats}/min
Stage 5 Speed: 1 mph
Stage 6 Grade: 10 %
Stage 6 HR: 151 {beats}/min
Stage 6 SBP: 170 mmHg
Stage 7 Grade: 10 %
Stage 7 HR: 151 {beats}/min
Stage 7 Speed: 1.7 mph
Stage 8 Grade: 0 %
Stage 9 Grade: 0 %
Stage 9 HR: 106 {beats}/min
Stage 9 Speed: 0 mph

## 2015-02-17 LAB — BASIC METABOLIC PANEL
Anion gap: 3 — ABNORMAL LOW (ref 5–15)
BUN: 12 mg/dL (ref 6–20)
CHLORIDE: 106 mmol/L (ref 101–111)
CO2: 29 mmol/L (ref 22–32)
Calcium: 8.4 mg/dL — ABNORMAL LOW (ref 8.9–10.3)
Creatinine, Ser: 0.63 mg/dL (ref 0.44–1.00)
GFR calc non Af Amer: >60 mL/min (ref >60)
Glucose, Bld: 101 mg/dL — ABNORMAL HIGH (ref 65–99)
POTASSIUM: 4.1 mmol/L (ref 3.5–5.1)
SODIUM: 138 mmol/L (ref 135–145)

## 2015-02-17 LAB — TROPONIN I
Troponin I: <0.03 ng/mL (ref <0.031)
Troponin I: <0.03 ng/mL (ref <0.031)

## 2015-02-17 LAB — CBC
HEMATOCRIT: 38.6 % (ref 35.0–47.0)
HEMOGLOBIN: 13.1 g/dL (ref 12.0–16.0)
MCH: 30.1 pg (ref 26.0–34.0)
MCHC: 33.9 g/dL (ref 32.0–36.0)
MCV: 88.6 fL (ref 80.0–100.0)
Platelets: 305 10*3/uL (ref 150–440)
RBC: 4.36 MIL/uL (ref 3.80–5.20)
RDW: 13.9 % (ref 11.5–14.5)
WBC: 7.7 10*3/uL (ref 3.6–11.0)

## 2015-02-17 MED ORDER — TECHNETIUM TC 99M SESTAMIBI - CARDIOLITE
13.0000 | Freq: Once | INTRAVENOUS | Status: AC | PRN
  Administered 2015-02-17: 13.694 via INTRAVENOUS

## 2015-02-17 MED ORDER — TECHNETIUM TC 99M SESTAMIBI - CARDIOLITE
30.0000 | Freq: Once | INTRAVENOUS | Status: AC | PRN
  Administered 2015-02-17: 30.94 via INTRAVENOUS

## 2015-02-17 NOTE — Discharge Instructions (Signed)

## 2015-02-17 NOTE — Care Management (Signed)
Admitted under observation for chest pain.  First troponin mildly elevated and second on e within normal limits.  At present there is no further work up ordered.  D dimer is within normal limits

## 2015-02-17 NOTE — Progress Notes (Signed)
Arrival Method: via stretcher with ED tech Mental Orientation: A&O Telemetry: MX40-21 Skin: intact, verified by Shanon Brow, RN IV: 20g right forearm Pain: mid sternal chest pain rating at a 6, PRN tramadol given Safety Measures: Safety Fall Prevention Plan has been given, discussed & signed, non skid socks in place 2A Orientation: Patient has been orientated to the room, unit & staff.  Family: Has been informed of plan of care.  Orders have been reviewed & implemented. Will continue to monitor the patient. Call light has been placed within reach.  Georgian Co, RN

## 2015-02-17 NOTE — Progress Notes (Signed)
Patient d/c'd home. Education provided, no questions at this time. Patient to be picked up by husband. Telemetry removed. Renee Beale R [place]  

## 2015-02-21 NOTE — Discharge Summary (Signed)
[place] at [place] NAME: Ashlee Everett    MR#:  [id number]  DATE OF BIRTH:  [birth year]  DATE OF ADMISSION:  02/16/2015 ADMITTING PHYSICIAN: Lance Coon, MD  DATE OF DISCHARGE: 02/17/2015  6:12 PM  PRIMARY CARE PHYSICIAN: Derinda Late MD    ADMISSION DIAGNOSIS:  Chest pain, unspecified chest pain type [R07.9]  DISCHARGE DIAGNOSIS:  Principal Problem:   Chest pain Active Problems:   Elevated troponin   Hypertension   Paroxysmal SVT (supraventricular tachycardia) ([place])   SECONDARY DIAGNOSIS:   Past Medical History  Diagnosis Date  . Diverticulitis   . IBS (irritable bowel syndrome)   . Sleep apnea   . Paroxysmal SVT (supraventricular tachycardia) ([place])   . Hypertension     HOSPITAL COURSE:  44 yo female admitted for chest pain. Please see Dr Tobey Grim dictated H & P for further details. She was ruled out with neg serial troponins. She underwent myoview which was neg and was chest pain free while in the Hospital and was d/c home in stable condition. She was agreeable with D/C plans. DISCHARGE CONDITIONS:   stable  CONSULTS OBTAINED:  Treatment Team:  Leonie Man, MD DRUG ALLERGIES:   Allergies  Allergen Reactions  . Fioricet [Butalbital-Apap-Caffeine] Shortness Of Breath and Nausea And Vomiting   DISCHARGE MEDICATIONS:   Discharge Medication List as of 02/17/2015  5:48 PM    CONTINUE these medications which have NOT CHANGED   Details  carvedilol (COREG) 3.125 MG tablet Take 3.125 mg by mouth 2 (two) times daily., Until Discontinued, Historical Med    estradiol (ESTRACE) 0.5 MG tablet Take 0.5 mg by mouth every other day. , Until Discontinued, Historical Med    ibuprofen (ADVIL,MOTRIN) 200 MG tablet Take 400 mg by mouth every 6 (six) hours as needed for mild pain., Until Discontinued, Historical Med       DISCHARGE INSTRUCTIONS:    DIET:  Cardiac diet  DISCHARGE CONDITION:   Good  ACTIVITY:  Activity as tolerated  OXYGEN:  Home Oxygen: No.   Oxygen Delivery: room air  DISCHARGE LOCATION:  home   If you experience worsening of your admission symptoms, develop shortness of breath, life threatening emergency, suicidal or homicidal thoughts you must seek medical attention immediately by calling 911 or calling your MD immediately  if symptoms less severe.  You Must read complete instructions/literature along with all the possible adverse reactions/side effects for all the Medicines you take and that have been prescribed to you. Take any new Medicines after you have completely understood and accpet all the possible adverse reactions/side effects.   Please note  You were cared for by a hospitalist during your hospital stay. If you have any questions about your discharge medications or the care you received while you were in the hospital after you are discharged, you can call the unit and asked to speak with the hospitalist on call if the hospitalist that took care of you is not available. Once you are discharged, your primary care physician will handle any further medical issues. Please note that NO REFILLS for any discharge medications will be authorized once you are discharged, as it is imperative that you return to your primary care physician (or establish a relationship with a primary care physician if you do not have one) for your aftercare needs so that they can reassess your need for medications and monitor your lab values.    On the day of Discharge: VITAL SIGNS:  Blood pressure 149/89, pulse 86, temperature 98 F (36.7 C), temperature source Oral, resp. rate 18, height 5\' 1"  (1.549 m), weight 115.35 kg (254 lb 4.8 oz), SpO2 98 %. PHYSICAL EXAMINATION:  GENERAL:  44 y.o.-year-old patient lying in the bed with no acute distress.  EYES: Pupils equal, round, reactive to light and accommodation. No scleral icterus. Extraocular muscles intact.  HEENT: Head  atraumatic, normocephalic. Oropharynx and nasopharynx clear.  NECK:  Supple, no jugular venous distention. No thyroid enlargement, no tenderness.  LUNGS: Normal breath sounds bilaterally, no wheezing, rales,rhonchi or crepitation. No use of accessory muscles of respiration.  CARDIOVASCULAR: S1, S2 normal. No murmurs, rubs, or gallops.  ABDOMEN: Soft, non-tender, non-distended. Bowel sounds present. No organomegaly or mass.  EXTREMITIES: No pedal edema, cyanosis, or clubbing.  NEUROLOGIC: Cranial nerves II through XII are intact. Muscle strength 5/5 in all extremities. Sensation intact. Gait not checked.  PSYCHIATRIC: The patient is alert and oriented x 3.  SKIN: No obvious rash, lesion, or ulcer.  DATA REVIEW:   CBC  Recent Labs Lab 02/17/15 0518  WBC 7.7  HGB 13.1  HCT 38.6  PLT 305    Chemistries   Recent Labs Lab 02/16/15 1637 02/17/15 0518  NA 141 138  K 3.7 4.1  CL 106 106  CO2 29 29  GLUCOSE 116* 101*  BUN 15 12  CREATININE 0.67 0.63  CALCIUM 9.0 8.4*  AST 14*  --   ALT 14  --   ALKPHOS 43  --   BILITOT 0.7  --     Management plans discussed with the patient, family and they are in agreement.  CODE STATUS: Full Code  TOTAL TIME TAKING CARE OF THIS PATIENT: 44 minutes.    [facility], [name] M.D on 02/21/2015 at 6:40 AM  Between 7am to 6pm - Pager - [phone]  After 6pm go to [email or web address] - password [email or web address] [place]  Office  [phone]  CC: Primary care physician; Derinda Late MD Glenetta Hew MD

## 2015-03-19 ENCOUNTER — Ambulatory Visit
Admission: RE | Admit: 2015-03-19 | Payer: No Typology Code available for payment source | Source: Ambulatory Visit | Admitting: Cardiology

## 2015-03-19 ENCOUNTER — Encounter: Admission: RE | Payer: Self-pay | Source: Ambulatory Visit

## 2015-03-19 SURGERY — LEFT HEART CATH AND CORONARY ANGIOGRAPHY
Anesthesia: Moderate Sedation

## 2015-04-04 DIAGNOSIS — I209 Angina pectoris, unspecified: Secondary | ICD-10-CM

## 2015-04-04 HISTORY — DX: Angina pectoris, unspecified: I20.9

## 2015-12-28 ENCOUNTER — Emergency Department
Admission: EM | Admit: 2015-12-28 | Discharge: 2015-12-28 | Disposition: A | Discharge status: Home / Self Care | Payer: PRIVATE HEALTH INSURANCE | Attending: Emergency Medicine | Admitting: Emergency Medicine

## 2015-12-28 ENCOUNTER — Emergency Department: Payer: PRIVATE HEALTH INSURANCE

## 2015-12-28 DIAGNOSIS — Z79899 Other long term (current) drug therapy: Secondary | ICD-10-CM | POA: Insufficient documentation

## 2015-12-28 DIAGNOSIS — I10 Essential (primary) hypertension: Secondary | ICD-10-CM | POA: Insufficient documentation

## 2015-12-28 DIAGNOSIS — R1032 Left lower quadrant pain: Secondary | ICD-10-CM | POA: Diagnosis present

## 2015-12-28 DIAGNOSIS — Z791 Long term (current) use of non-steroidal anti-inflammatories (NSAID): Secondary | ICD-10-CM | POA: Insufficient documentation

## 2015-12-28 DIAGNOSIS — K625 Hemorrhage of anus and rectum: Secondary | ICD-10-CM | POA: Insufficient documentation

## 2015-12-28 LAB — CBC
HCT: 42.3 % (ref 35.0–47.0)
Hemoglobin: 14.7 g/dL (ref 12.0–16.0)
MCH: 30.4 pg (ref 26.0–34.0)
MCHC: 34.8 g/dL (ref 32.0–36.0)
MCV: 87.4 fL (ref 80.0–100.0)
PLATELETS: 354 10*3/uL (ref 150–440)
RBC: 4.85 MIL/uL (ref 3.80–5.20)
RDW: 13.9 % (ref 11.5–14.5)
WBC: 9.2 10*3/uL (ref 3.6–11.0)

## 2015-12-28 LAB — PROTIME-INR
INR: 0.93
Prothrombin Time: 12.5 seconds (ref 11.4–15.2)

## 2015-12-28 LAB — COMPREHENSIVE METABOLIC PANEL
ALT: 26 U/L (ref 14–54)
ANION GAP: 5 (ref 5–15)
AST: 22 U/L (ref 15–41)
Albumin: 4 g/dL (ref 3.5–5.0)
Alkaline Phosphatase: 33 U/L — ABNORMAL LOW (ref 38–126)
BUN: 12 mg/dL (ref 6–20)
CHLORIDE: 104 mmol/L (ref 101–111)
CO2: 29 mmol/L (ref 22–32)
Calcium: 8.9 mg/dL (ref 8.9–10.3)
Creatinine, Ser: 0.64 mg/dL (ref 0.44–1.00)
GFR calc non Af Amer: >60 mL/min (ref >60)
Glucose, Bld: 75 mg/dL (ref 65–99)
POTASSIUM: 3.8 mmol/L (ref 3.5–5.1)
SODIUM: 138 mmol/L (ref 135–145)
Total Bilirubin: 0.9 mg/dL (ref 0.3–1.2)
Total Protein: 7.7 g/dL (ref 6.5–8.1)

## 2015-12-28 IMAGING — CT CT ABD-PELV W/ CM
2 of 5 series · 16 of 46 positions shown, 18 images · IV contrast (APPLIED)
Comparison: CT Abdomen and Pelvis [DATE].

CLINICAL DATA: 45-year-old female with left lower quadrant pain for
2 days and bloody stools. Recent treatment for diverticulitis.
Initial encounter.

EXAM:
CT ABDOMEN AND PELVIS WITH CONTRAST
TECHNIQUE: Multidetector CT imaging of the abdomen and pelvis was performed
using the standard protocol following bolus administration of
intravenous contrast.
CONTRAST:  100mL [4K] IOPAMIDOL ([4K]) INJECTION 61%

[Series 2: axial st · axial · 0.87mm/px · z∈[-873,-493]mm · 13 of 86 slices shown, 15 images]
[im 5/86  soft-tissue]
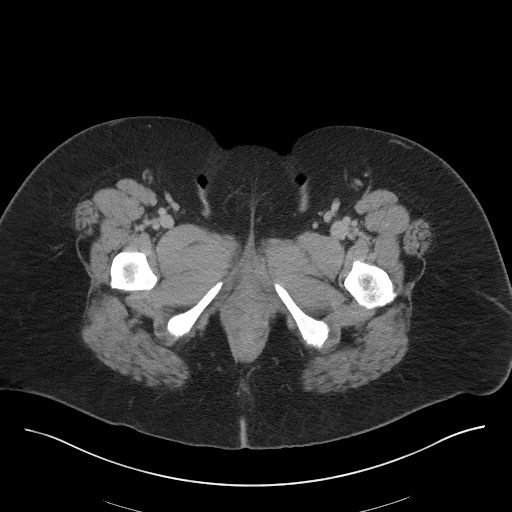
[im 5/86  bone]
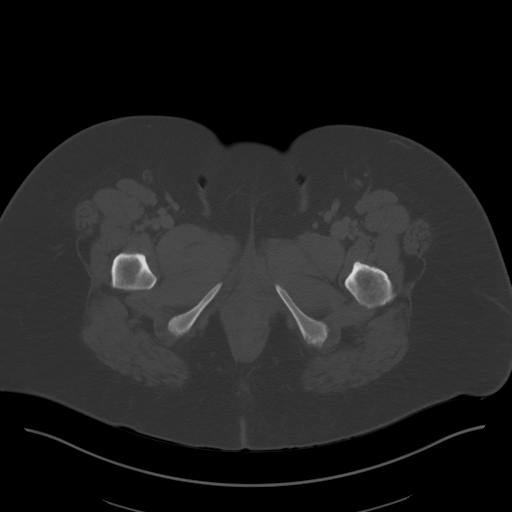
[im 14/86  soft-tissue]
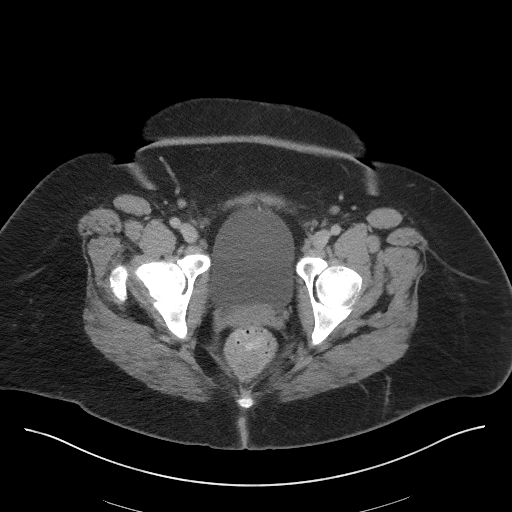
[im 18/86  soft-tissue]
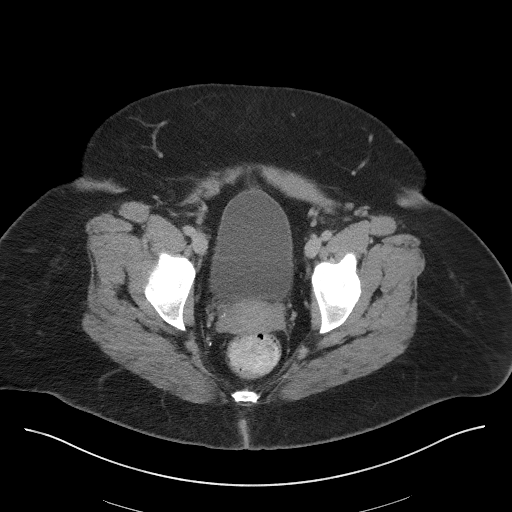
[im 23/86  soft-tissue]
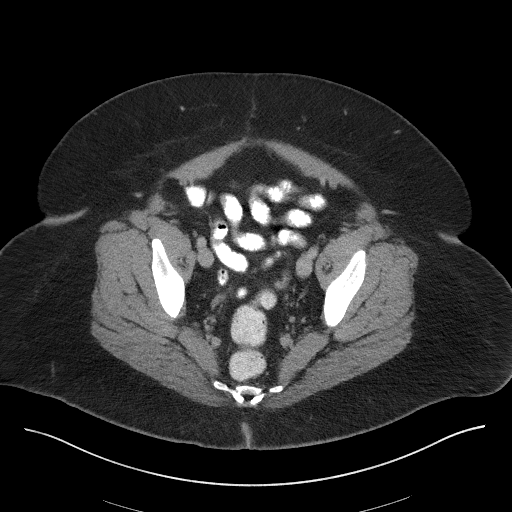
[im 32/86  soft-tissue]
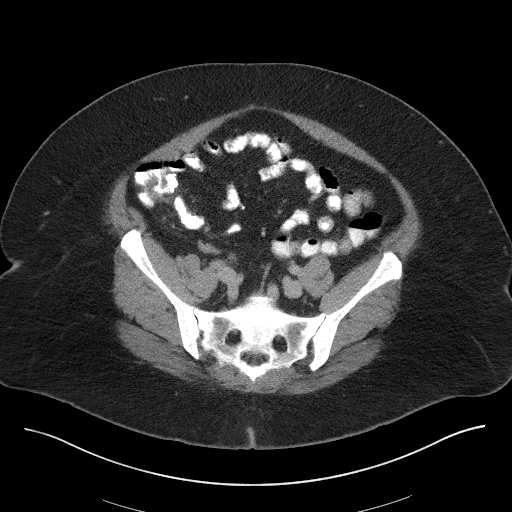
[im 36/86  soft-tissue]
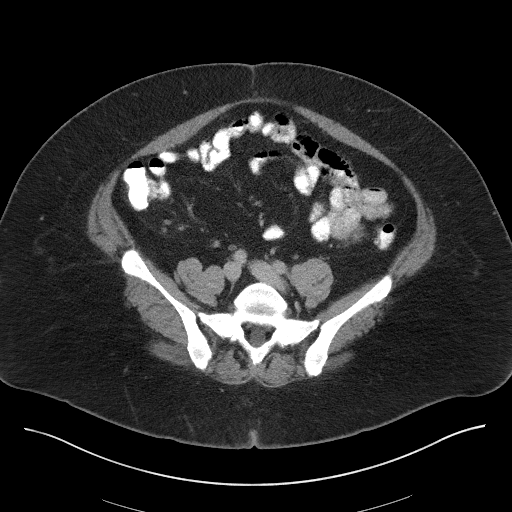
[im 45/86  soft-tissue]
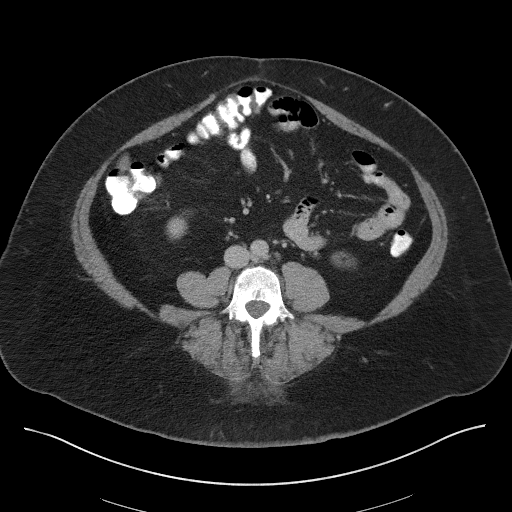
[im 50/86  soft-tissue]
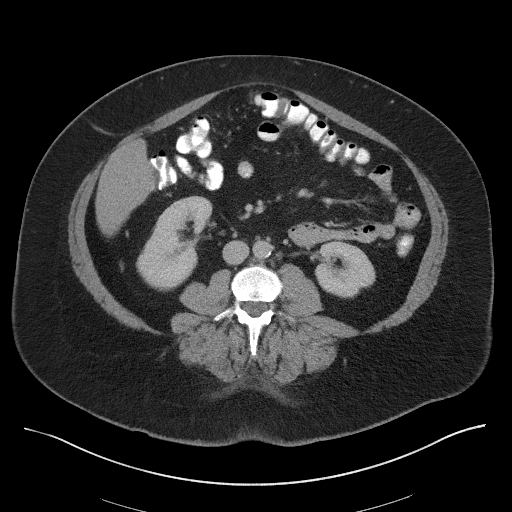
[im 54/86  soft-tissue]
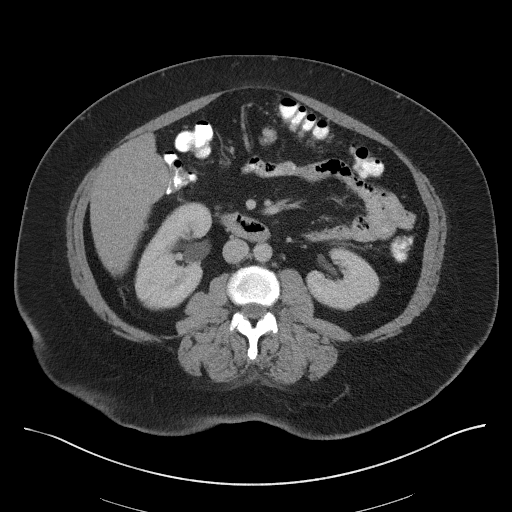
[im 54/86  bone]
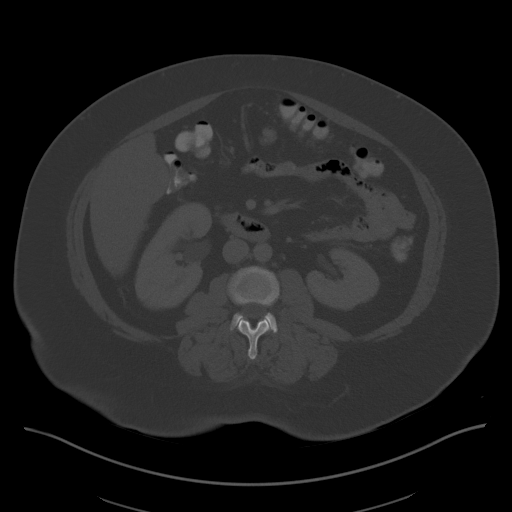
[im 63/86  soft-tissue]
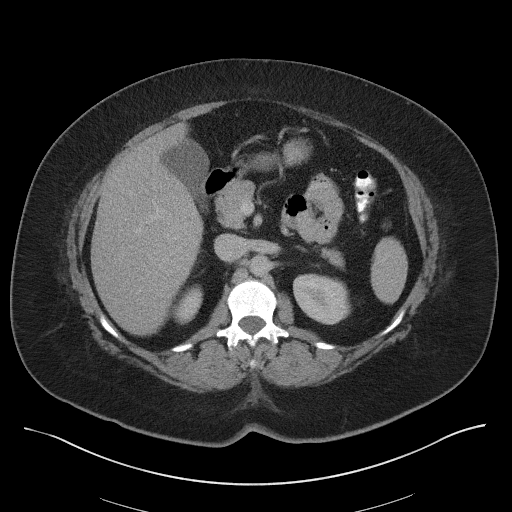
[im 68/86  soft-tissue]
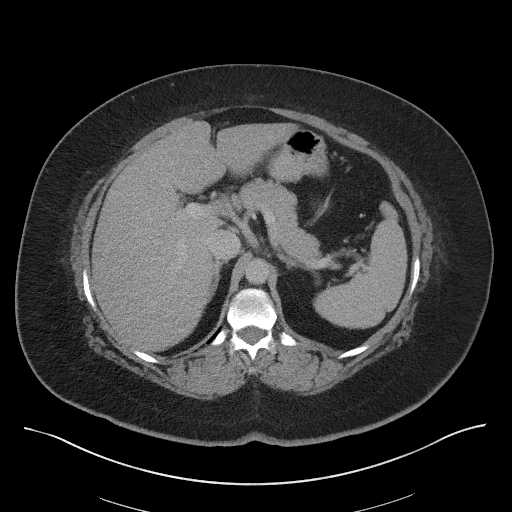
[im 72/86  soft-tissue]
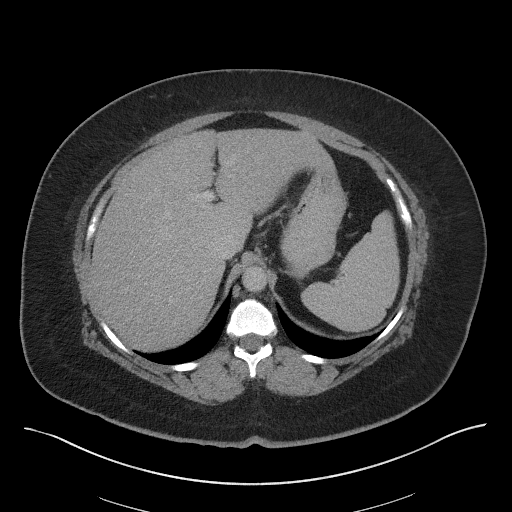
[im 81/86  soft-tissue]
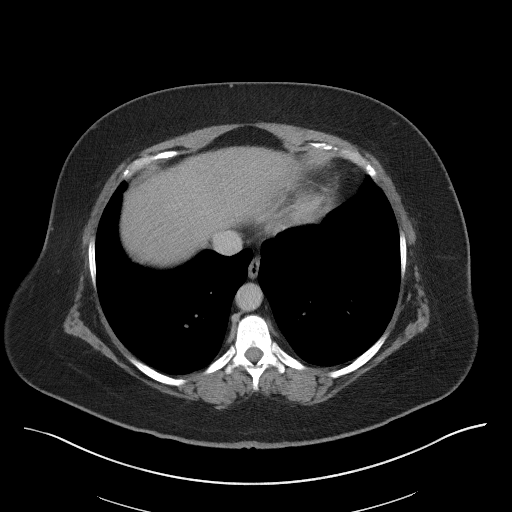

[Series 6: coronal st · coronal · 0.73mm/px · 3 of 111 slices shown]
[im 37/111  soft-tissue]
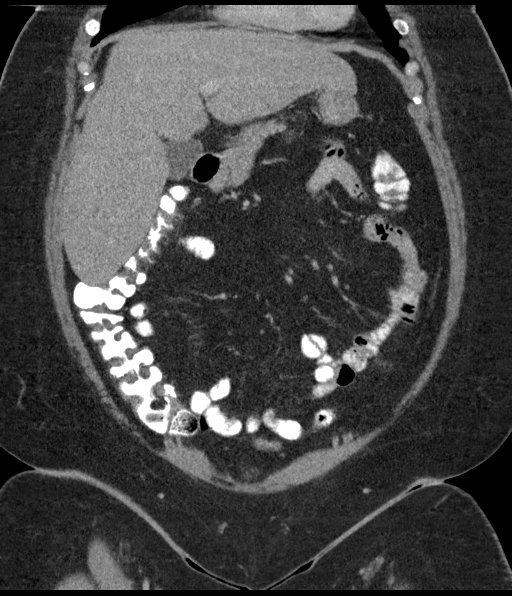
[im 49/111  soft-tissue]
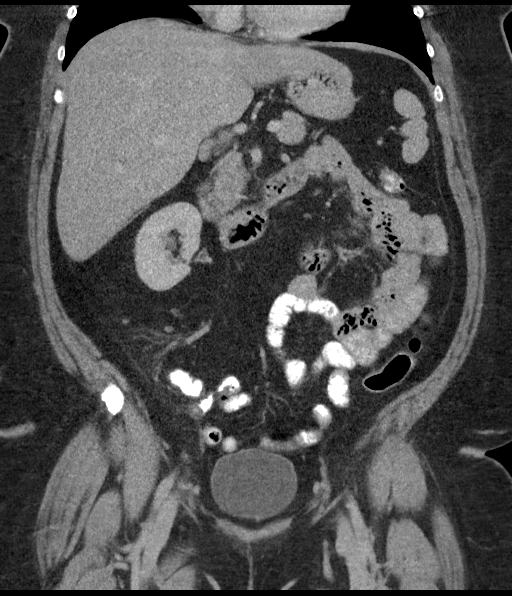
[im 62/111  soft-tissue]
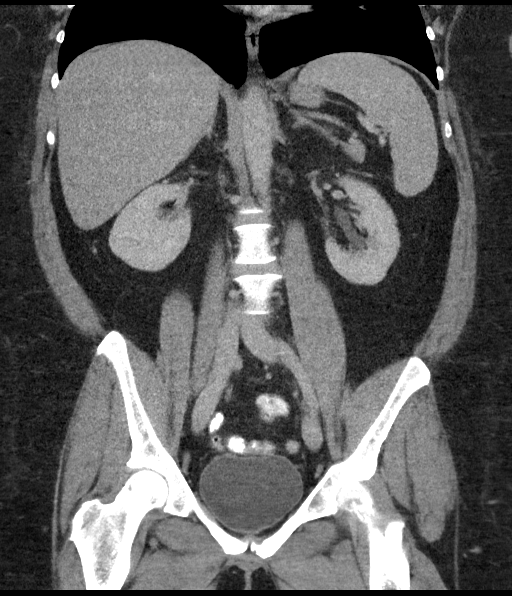

[16 of 46 positions shown; findings below may reference images not displayed]

FINDINGS: Lower chest: Negative lung bases. No pericardial or pleural
effusion.

No upper abdominal free air or free fluid.

Hepatobiliary: Chronic mild decreased density throughout the liver
suggesting a degree of steatosis. Negative gallbladder. No biliary
ductal enlargement.

Pancreas: Negative.

Spleen: Negative.

Adrenals/Urinary Tract: Negative adrenal glands. Horizontal axis of
the right kidney again noted (normal variant). Bilateral renal
enhancement and contrast excretion is normal. Unremarkable urinary
bladder. Negative ureters. No urologic calculus identified. Stable
small right hemi pelvis phleboliths.

Stomach/Bowel: Oral contrast has reached the rectum. Mild
diverticulosis in the proximal sigmoid colon with no active
inflammation. No rectal or sigmoid inflammation. Occasional
diverticulosis in the left colon without active inflammation.
Negative transverse colon. Negative right colon. Diminutive or
absent appendix. Negative terminal ileum. No dilated or inflamed
small bowel. Decompressed stomach. Negative duodenum.

Vascular/Lymphatic: Mild Calcified aortic atherosclerosis. Major
arterial structures are patent. Portal venous system appears patent.

No lymphadenopathy.

Reproductive: Surgically absent uterus. Normal right adnexa.
Diminutive or absent left adnexa.

Other: No pelvic free fluid.

Musculoskeletal: Chronic degenerative changes at the pubic
symphysis. No acute osseous abnormality identified.
IMPRESSION: 1. No acute or inflammatory process identified in the abdomen or
pelvis.
2. Mild diverticulosis of the descending and sigmoid colon without
active inflammation. Oral contrast has reached the rectum.
3. Mild chronic hepatic steatosis suspected.
4. Mild Calcified aortic atherosclerosis.

## 2015-12-28 MED ORDER — IOPAMIDOL (ISOVUE-300) INJECTION 61%
30.0000 mL | Freq: Once | INTRAVENOUS | Status: AC | PRN
  Administered 2015-12-28: 30 mL via ORAL

## 2015-12-28 MED ORDER — IOPAMIDOL (ISOVUE-300) INJECTION 61%
100.0000 mL | Freq: Once | INTRAVENOUS | Status: AC | PRN
  Administered 2015-12-28: 100 mL via INTRAVENOUS

## 2015-12-28 NOTE — ED Provider Notes (Signed)
[facility] Emergency Department Provider Note  Time seen: 9:16 AM  I have reviewed the triage vital signs and the nursing notes.   HISTORY  Chief Complaint Rectal Bleeding    HPI Ashlee Everett is a 45 y.o. female with a past medical history of hypertension, diverticulitis status post partial colectomy, who presents to the emergency department for rectal bleeding the left lower quadrant abdominal pain. According to the patient approximately 2 weeks ago she developed rectal bleeding along with mild left lower quadrant abdominal pain. She saw her doctor at that time and was prescribed a course of antibiotics. Patient was told if she continued to have bleeding to go to the emergency department or go see a GI doctor. Patient states for the past 2 days she has now had rectal bleeding once again along with moderate left lower quadrant abdominal pain so she came to the emergency department for evaluation. Denies any black stool. Denies any vomiting. Describes abdominal pain as moderate and dull located mostly in the left lower quadrant. Denies fever.  Past Medical History:  Diagnosis Date  . Diverticulitis   . Hypertension   . IBS (irritable bowel syndrome)   . Paroxysmal SVT (supraventricular tachycardia) ([place])   . Sleep apnea     Patient Active Problem List   Diagnosis Date Noted  . Chest pain 02/16/2015  . Elevated troponin 02/16/2015  . Hypertension 02/16/2015  . Paroxysmal SVT (supraventricular tachycardia) ([place]) 02/16/2015    Past Surgical History:  Procedure Laterality Date  . ABDOMINAL HYSTERECTOMY    . APPENDECTOMY    . LAPAROSCOPIC LYSIS OF ADHESIONS    . SALPINGOOPHORECTOMY Left   . SIGMOID RESECTION / RECTOPEXY    . TUBAL LIGATION      Prior to Admission medications   Medication Sig Start Date End Date Taking? Authorizing Provider  carvedilol (COREG) 3.125 MG tablet Take 3.125 mg by mouth 2 (two) times daily.    Historical Provider, MD   estradiol (ESTRACE) 0.5 MG tablet Take 0.5 mg by mouth every other day.     Historical Provider, MD  ibuprofen (ADVIL,MOTRIN) 200 MG tablet Take 400 mg by mouth every 6 (six) hours as needed for mild pain.    Historical Provider, MD    Allergies  Allergen Reactions  . Fioricet [Butalbital-Apap-Caffeine] Shortness Of Breath and Nausea And Vomiting    Family History  Problem Relation Age of Onset  . Hypertension Mother   . Diabetes Mother   . Lung cancer Father   . Heart attack Father     Social History Social History  Substance Use Topics  . Smoking status: Never Smoker  . Smokeless tobacco: Never Used  . Alcohol use No    Review of Systems Constitutional: Negative for fever. Cardiovascular: Negative for chest pain. Respiratory: Negative for shortness of breath. Gastrointestinal: Left lower quadrant pain. Rectal bleeding. Genitourinary: Negative for dysuria. Neurological: Negative for headache 10-point ROS otherwise negative.  ____________________________________________   PHYSICAL EXAM:  VITAL SIGNS: ED Triage Vitals  Enc Vitals Group     BP 12/28/15 0832 (!) 167/101     Pulse Rate 12/28/15 0832 69     Resp 12/28/15 0832 18     Temp 12/28/15 0832 98.5 F (36.9 C)     Temp Source 12/28/15 0832 Oral     SpO2 12/28/15 0832 98 %     Weight 12/28/15 0832 260 lb (117.9 kg)     Height 12/28/15 0832 5\' 1"  (1.549 m)  Head Circumference --      Peak Flow --      Pain Score 12/28/15 0833 7     Pain Loc --      Pain Edu? --      Excl. in [place]? --     Constitutional: Alert and oriented. Well appearing and in no distress. Eyes: Normal exam ENT   Head: Normocephalic and atraumatic.   Mouth/Throat: Mucous membranes are moist. Cardiovascular: Normal rate, regular rhythm. No murmur Respiratory: Normal respiratory effort without tachypnea nor retractions. Breath sounds are clear  Gastrointestinal: Soft, moderate left lower quadrant tenderness palpation, mild  left upper quadrant tenderness to palpation, mild suprapubic tenderness palpation, no rebound no guarding. No distention. No CVA tenderness. Musculoskeletal: Nontender with normal range of motion in all extremities. Neurologic:  Normal speech and language. No gross focal neurologic deficits  Skin:  Skin is warm, dry and intact.  Psychiatric: Mood and affect are normal.   ____________________________________________   RADIOLOGY  CT shows no acute abnormality  ____________________________________________   INITIAL IMPRESSION / ASSESSMENT AND PLAN / ED COURSE  Pertinent labs & imaging results that were available during my care of the patient were reviewed by me and considered in my medical decision making (see chart for details).  Rectal examination no external hemorrhoids noted. Patient has light brown stool but it is guaiac positive. Given the patient's moderate abdominal pain with rectal bleeding we'll proceed with a CT abdomen/pelvis to evaluate for diverticulitis.  Patient's labs are within normal limits. CT shows no acute abnormality. Given light brown stool with a negative CT and normal labs we'll discharge the patient home with GI follow-up. The patient is agreeable to plan.  ____________________________________________   FINAL CLINICAL IMPRESSION(S) / ED DIAGNOSES  Lower abdominal pain Rectal bleeding    Harvest Dark, MD 12/28/15 1103

## 2015-12-28 NOTE — ED Triage Notes (Signed)
Pt c/o bloody stools for the past couple of days with a recent treatment for diverticulitis.Marland Kitchen

## 2016-04-28 ENCOUNTER — Ambulatory Visit
Admission: RE | Admit: 2016-04-28 | Payer: No Typology Code available for payment source | Source: Ambulatory Visit | Admitting: Gastroenterology

## 2016-04-28 ENCOUNTER — Encounter: Admission: RE | Payer: Self-pay | Source: Ambulatory Visit

## 2016-04-28 SURGERY — COLONOSCOPY WITH PROPOFOL
Anesthesia: General

## 2017-05-27 ENCOUNTER — Emergency Department
Admission: EM | Admit: 2017-05-27 | Discharge: 2017-05-27 | Disposition: A | Discharge status: Home / Self Care | Payer: PRIVATE HEALTH INSURANCE | Attending: Emergency Medicine | Admitting: Emergency Medicine

## 2017-05-27 ENCOUNTER — Emergency Department: Payer: PRIVATE HEALTH INSURANCE

## 2017-05-27 ENCOUNTER — Encounter: Payer: Self-pay | Admitting: Emergency Medicine

## 2017-05-27 DIAGNOSIS — I10 Essential (primary) hypertension: Secondary | ICD-10-CM | POA: Diagnosis not present

## 2017-05-27 DIAGNOSIS — Z79899 Other long term (current) drug therapy: Secondary | ICD-10-CM | POA: Insufficient documentation

## 2017-05-27 DIAGNOSIS — R1013 Epigastric pain: Secondary | ICD-10-CM | POA: Diagnosis not present

## 2017-05-27 LAB — LIPASE, BLOOD: LIPASE: 25 U/L (ref 11–51)

## 2017-05-27 LAB — BILIRUBIN, DIRECT: Bilirubin, Direct: 0.1 mg/dL (ref 0.1–0.5)

## 2017-05-27 LAB — COMPREHENSIVE METABOLIC PANEL
ALBUMIN: 4 g/dL (ref 3.5–5.0)
ALT: 22 U/L (ref 14–54)
AST: 21 U/L (ref 15–41)
Alkaline Phosphatase: 39 U/L (ref 38–126)
Anion gap: 9 (ref 5–15)
BILIRUBIN TOTAL: 0.9 mg/dL (ref 0.3–1.2)
BUN: 12 mg/dL (ref 6–20)
CHLORIDE: 106 mmol/L (ref 101–111)
CO2: 24 mmol/L (ref 22–32)
CREATININE: 0.65 mg/dL (ref 0.44–1.00)
Calcium: 8.7 mg/dL — ABNORMAL LOW (ref 8.9–10.3)
GFR calc Af Amer: >60 mL/min (ref >60)
GLUCOSE: 87 mg/dL (ref 65–99)
POTASSIUM: 4 mmol/L (ref 3.5–5.1)
Sodium: 139 mmol/L (ref 135–145)
Total Protein: 7.7 g/dL (ref 6.5–8.1)

## 2017-05-27 LAB — CBC
HEMATOCRIT: 42.5 % (ref 35.0–47.0)
Hemoglobin: 14.2 g/dL (ref 12.0–16.0)
MCH: 29.1 pg (ref 26.0–34.0)
MCHC: 33.3 g/dL (ref 32.0–36.0)
MCV: 87.2 fL (ref 80.0–100.0)
PLATELETS: 334 10*3/uL (ref 150–440)
RBC: 4.87 MIL/uL (ref 3.80–5.20)
RDW: 13.7 % (ref 11.5–14.5)
WBC: 8.4 10*3/uL (ref 3.6–11.0)

## 2017-05-27 LAB — TROPONIN I: Troponin I: <0.03 ng/mL (ref <0.03)

## 2017-05-27 IMAGING — CT CT ABD-PELV W/O CM
2 of 4 series · 17 of 46 positions shown, 19 images · non-contrast
Comparison: [DATE]

CLINICAL DATA: Epigastric pain and abdominal distention for 2
weeks. Diarrhea.

EXAM:
CT ABDOMEN AND PELVIS WITHOUT CONTRAST
TECHNIQUE: Multidetector CT imaging of the abdomen and pelvis was performed
following the standard protocol without IV contrast.

[Series 2: routine abd/pel wo · axial · 0.82mm/px · z∈[-469,-74]mm · 14 of 87 slices shown, 16 images]
[im 4/87  soft-tissue]
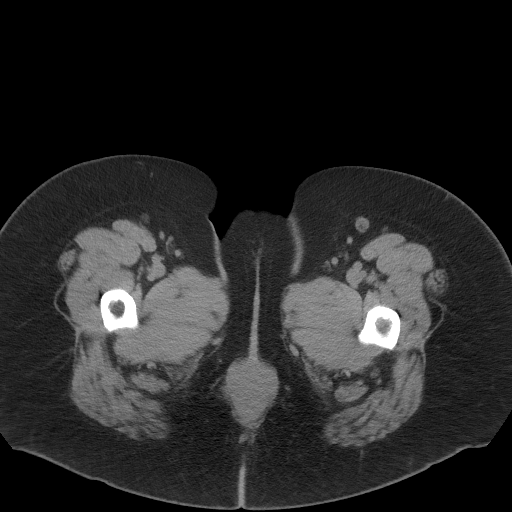
[im 4/87  bone]
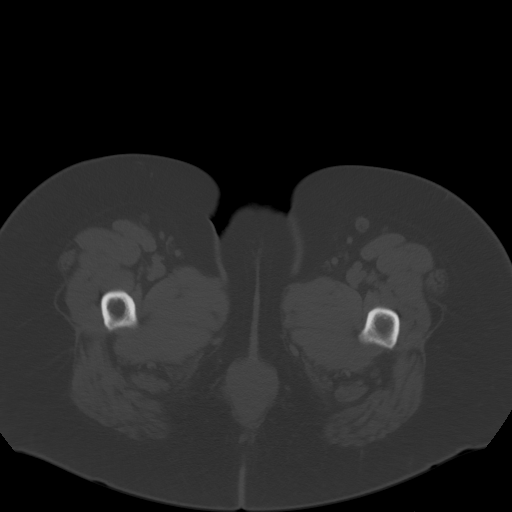
[im 10/87  soft-tissue]
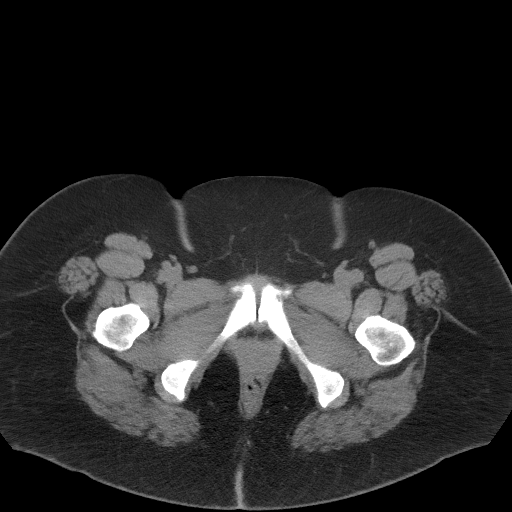
[im 16/87  soft-tissue]
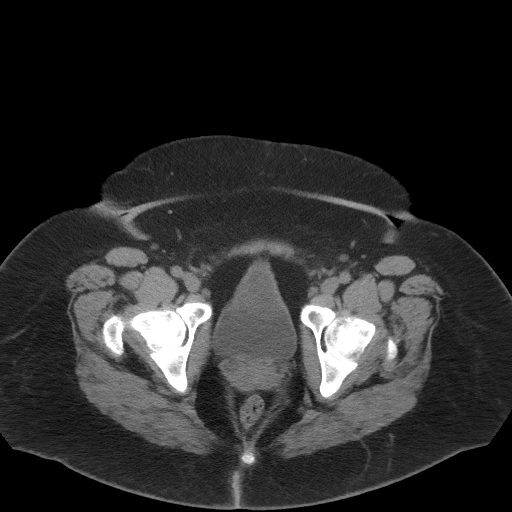
[im 23/87  soft-tissue]
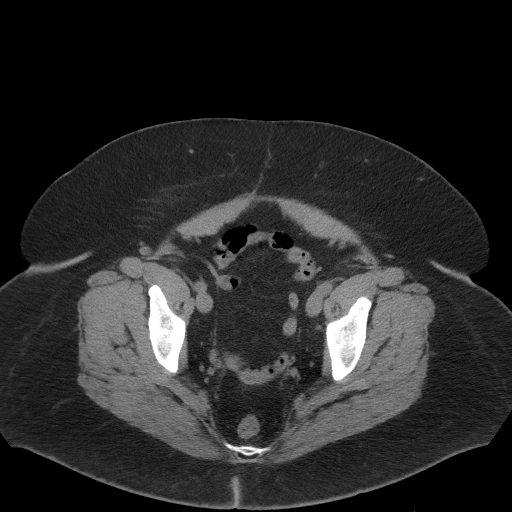
[im 29/87  soft-tissue]
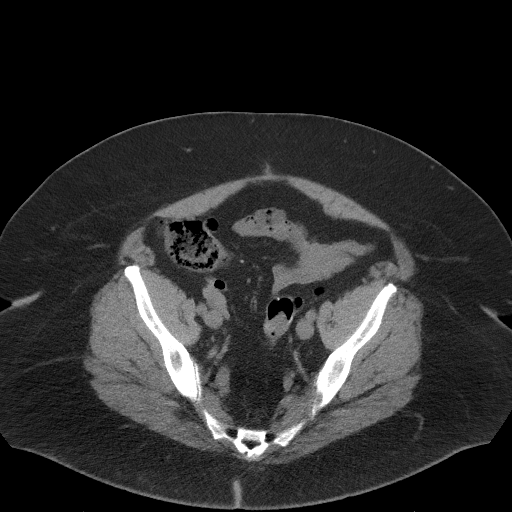
[im 36/87  soft-tissue]
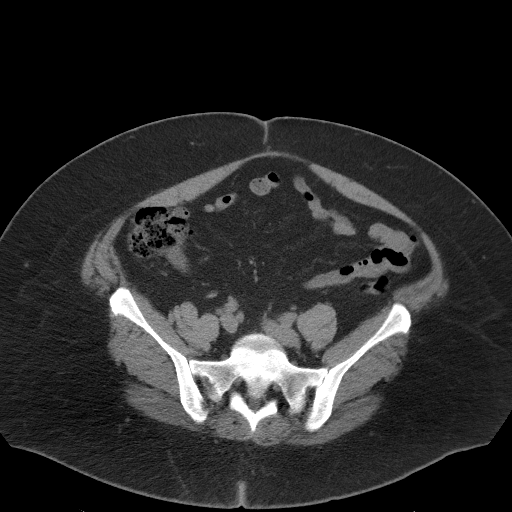
[im 42/87  soft-tissue]
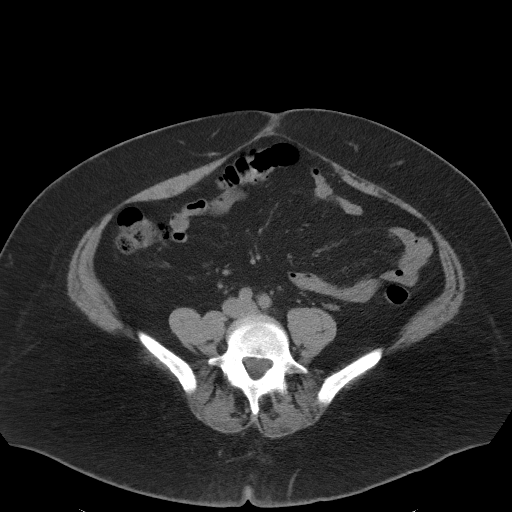
[im 45/87  soft-tissue]
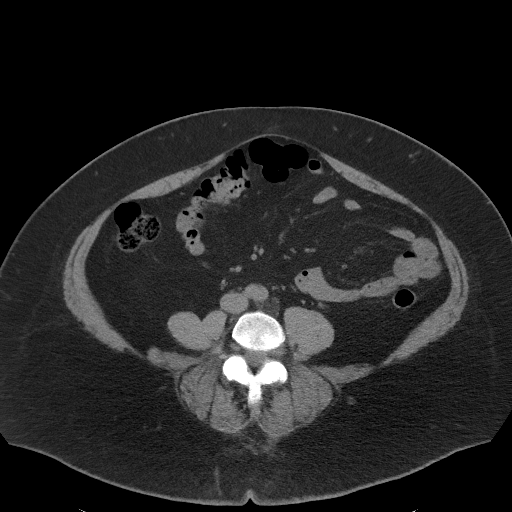
[im 51/87  soft-tissue]
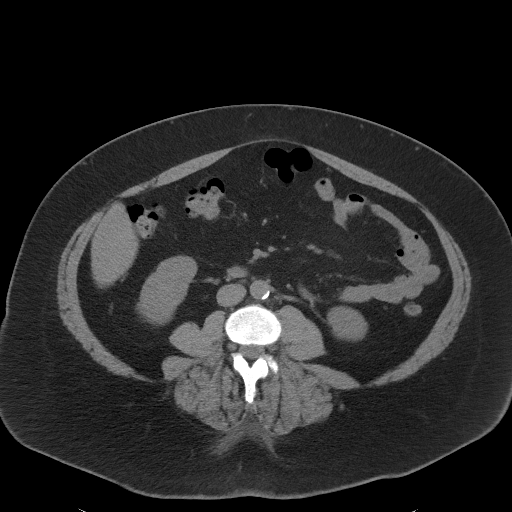
[im 51/87  bone]
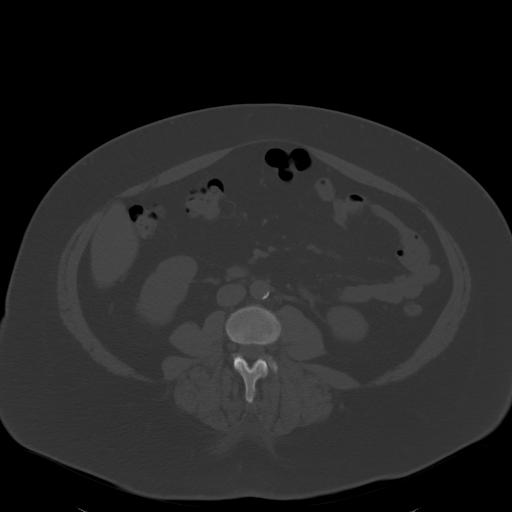
[im 58/87  soft-tissue]
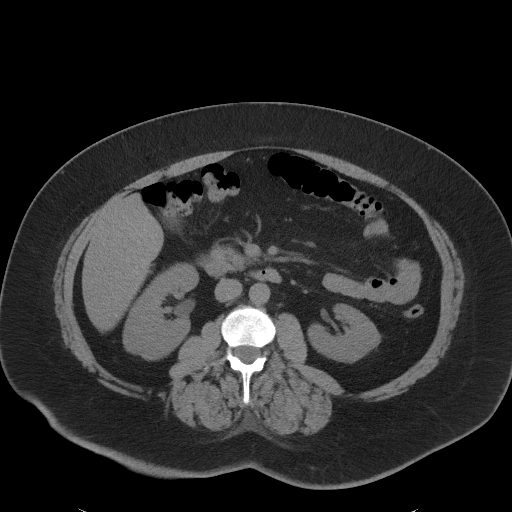
[im 64/87  soft-tissue]
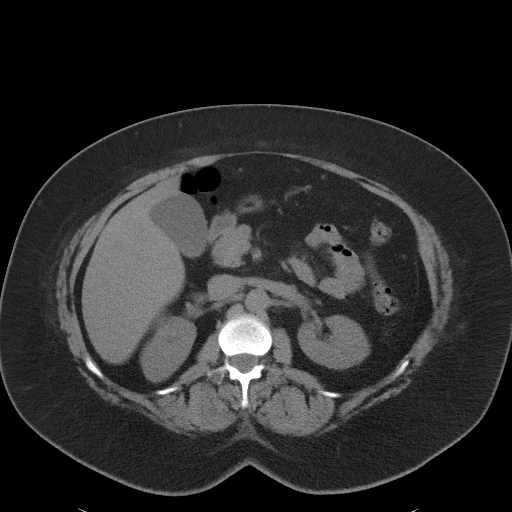
[im 71/87  soft-tissue]
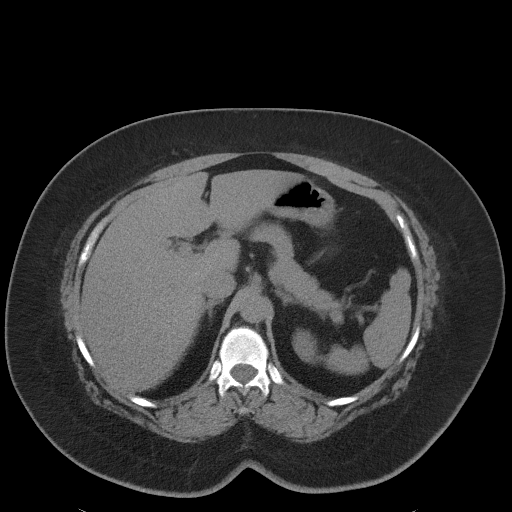
[im 77/87  soft-tissue]
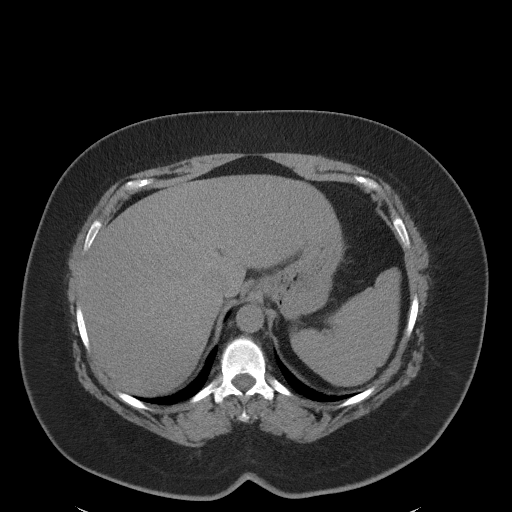
[im 83/87  soft-tissue]
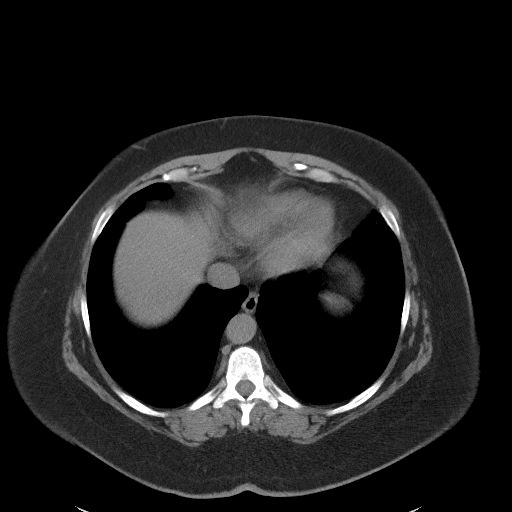

[Series 5: coronal st · coronal · 0.80mm/px · 3 of 100 slices shown]
[im 34/100  soft-tissue]
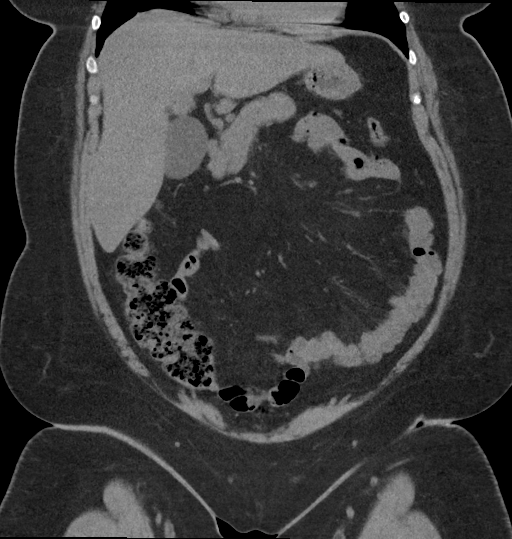
[im 45/100  soft-tissue]
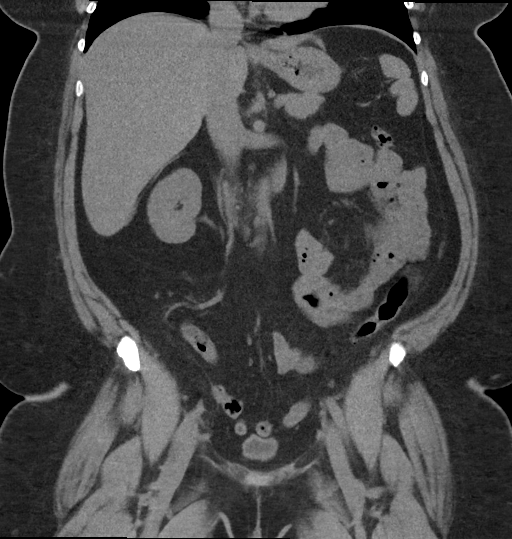
[im 56/100  soft-tissue]
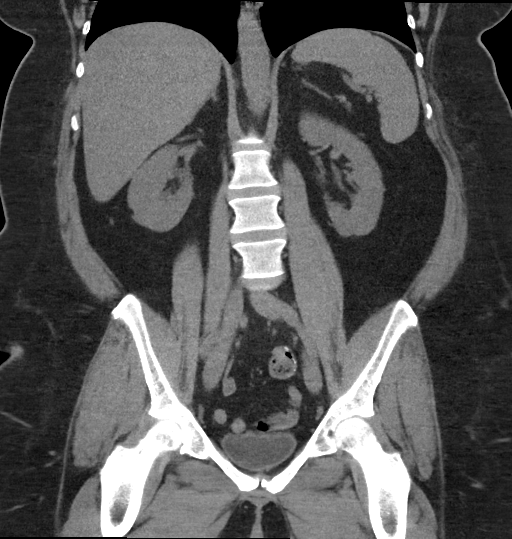

[17 of 46 positions shown; findings below may reference images not displayed]

FINDINGS: Lower chest: No acute findings.

Hepatobiliary: No mass visualized on this unenhanced exam.
Gallbladder is unremarkable.

Pancreas: No mass or inflammatory process visualized on this
unenhanced exam.

Spleen:  Within normal limits in size.

Adrenals/Urinary tract: No evidence of urolithiasis or
hydronephrosis. Unremarkable unopacified urinary bladder.

Stomach/Bowel: No evidence of obstruction, inflammatory process, or
abnormal fluid collections.

Vascular/Lymphatic: No pathologically enlarged lymph nodes
identified. No evidence of abdominal aortic aneurysm. Aortic
atherosclerosis.

Reproductive: Prior hysterectomy noted. Adnexal regions are
unremarkable in appearance.

Other:  None.

Musculoskeletal:  No suspicious bone lesions identified.
IMPRESSION: No evidence of urolithiasis, hydronephrosis, or other acute
findings.

## 2017-05-27 MED ORDER — FAMOTIDINE 20 MG PO TABS
ORAL_TABLET | ORAL | Status: AC
  Administered 2017-05-27: 40 mg via ORAL
  Filled 2017-05-27: qty 2

## 2017-05-27 MED ORDER — DICYCLOMINE HCL 10 MG PO CAPS
20.0000 mg | ORAL_CAPSULE | Freq: Once | ORAL | Status: AC
  Administered 2017-05-27: 20 mg via ORAL

## 2017-05-27 MED ORDER — GI COCKTAIL ~~LOC~~
ORAL | Status: AC
  Administered 2017-05-27: 30 mL via ORAL
  Filled 2017-05-27: qty 30

## 2017-05-27 MED ORDER — FAMOTIDINE 20 MG PO TABS
40.0000 mg | ORAL_TABLET | Freq: Once | ORAL | Status: AC
  Administered 2017-05-27: 40 mg via ORAL

## 2017-05-27 MED ORDER — DICYCLOMINE HCL 10 MG PO CAPS
ORAL_CAPSULE | ORAL | Status: AC
  Administered 2017-05-27: 20 mg via ORAL
  Filled 2017-05-27: qty 2

## 2017-05-27 MED ORDER — GI COCKTAIL ~~LOC~~
30.0000 mL | Freq: Once | ORAL | Status: AC
  Administered 2017-05-27: 30 mL via ORAL

## 2017-05-27 NOTE — ED Notes (Signed)
ED Provider at bedside. 

## 2017-05-27 NOTE — ED Notes (Signed)
Pt states started taking prilosec Wednesday, no relief. Pt reports intermittent mid epigastric pain x2weeks, becoming more constant, especially worse after eating. Diarrhea follows meals.

## 2017-05-27 NOTE — ED Provider Notes (Signed)
[facility] Emergency Department Provider Note  ____________________________________________   First MD Initiated Contact with Patient 05/27/17 1547     (approximate)  I have reviewed the triage vital signs and the nursing notes.   HISTORY  Chief Complaint Abdominal Pain    HPI Ashlee Everett is a 47 y.o. female presents to the emergency department with 2-3 weeks of moderate to severe mid epigastric pain radiating to her back.  Associated with nausea and difficulty keeping food down.  She went to her primary care physician's assistant who prescribed her omeprazole 5 days ago and she has noted no relief.  The patient does report a long history of "stomach problems".  Her symptoms are constant although clearly worsened by food.  No chest pain.  No shortness of breath.  She does have a surgical history of hysterectomy and appendectomy along with a remote sigmoid colectomy for diverticulitis.  She denies diarrhea.  She is concerned that this could be her gallbladder.    Past Medical History:  Diagnosis Date  . Diverticulitis   . Hypertension   . IBS (irritable bowel syndrome)   . Paroxysmal SVT (supraventricular tachycardia) ([place])   . Sleep apnea     Patient Active Problem List   Diagnosis Date Noted  . Chest pain 02/16/2015  . Elevated troponin 02/16/2015  . Hypertension 02/16/2015  . Paroxysmal SVT (supraventricular tachycardia) ([place]) 02/16/2015    Past Surgical History:  Procedure Laterality Date  . ABDOMINAL HYSTERECTOMY    . APPENDECTOMY    . LAPAROSCOPIC LYSIS OF ADHESIONS    . SALPINGOOPHORECTOMY Left   . SIGMOID RESECTION / RECTOPEXY    . TUBAL LIGATION      Prior to Admission medications   Medication Sig Start Date End Date Taking? Authorizing Provider  carvedilol (COREG) 3.125 MG tablet Take 3.125 mg by mouth 2 (two) times daily.    [provider]  estradiol (ESTRACE) 0.5 MG tablet Take 0.5 mg by mouth every other day.      [provider]  ibuprofen (ADVIL,MOTRIN) 200 MG tablet Take 400 mg by mouth every 6 (six) hours as needed for mild pain.    [provider]    Allergies Fioricet [butalbital-apap-caffeine]  Family History  Problem Relation Age of Onset  . Hypertension Mother   . Diabetes Mother   . Lung cancer Father   . Heart attack Father     Social History Social History   Tobacco Use  . Smoking status: Never Smoker  . Smokeless tobacco: Never Used  Substance Use Topics  . Alcohol use: No    Alcohol/week: 0.0 oz  . Drug use: No    Review of Systems Constitutional: No fever/chills Eyes: No visual changes. ENT: No sore throat. Cardiovascular: Denies chest pain. Respiratory: Denies shortness of breath. Gastrointestinal: Positive for abdominal pain.  Positive for nausea, positive for vomiting.  No diarrhea.  No constipation. Genitourinary: Negative for dysuria. Musculoskeletal: Negative for back pain. Skin: Negative for rash. Neurological: Negative for headaches, focal weakness or numbness.   ____________________________________________   PHYSICAL EXAM:  VITAL SIGNS: ED Triage Vitals [05/27/17 1447]  Enc Vitals Group     BP 138/87     Pulse Rate 68     Resp 18     Temp 98.9 F (37.2 C)     Temp Source Oral     SpO2 100 %     Weight 258 lb (117 kg)     Height 5\' 1"  (1.549  m)     Head Circumference      Peak Flow      Pain Score 9     Pain Loc      Pain Edu?      Excl. in [place]?     Constitutional: Alert and oriented x4 somewhat anxious appearing nontoxic no diaphoresis speaks in full clear sentences Eyes: PERRL EOMI. Head: Atraumatic. Nose: No congestion/rhinnorhea. Mouth/Throat: No trismus Neck: No stridor.   Cardiovascular: Normal rate, regular rhythm. Grossly normal heart sounds.  Good peripheral circulation. Respiratory: Normal respiratory effort.  No retractions. Lungs CTAB and moving good air Gastrointestinal: Morbidly obese abdomen soft  nondistended she is tender left upper quadrant greater than left lower quadrant with mild rebound of the no guarding no peritonitis Musculoskeletal: No lower extremity edema   Neurologic:  Normal speech and language. No gross focal neurologic deficits are appreciated. Skin:  Skin is warm, dry and intact. No rash noted. Psychiatric: Mood and affect are normal. Speech and behavior are normal.    ____________________________________________   DIFFERENTIAL includes but not limited to  Diverticulitis, small bowel obstruction, nephrolithiasis, pyelonephritis, gastric ulcer, biliary colic ____________________________________________   LABS (all labs ordered are listed, but only abnormal results are displayed)  Labs Reviewed  COMPREHENSIVE METABOLIC PANEL - Abnormal; Notable for the following components:      Result Value   Calcium 8.7 (*)    All other components within normal limits  LIPASE, BLOOD  CBC  BILIRUBIN, DIRECT  TROPONIN I    Lab work reviewed by me with no acute disease __________________________________________  EKG  ED ECG REPORT I, Darel Hong, the attending physician, personally viewed and interpreted this ECG.  Date: 05/28/2017 EKG Time:  Rate: 75 Rhythm: normal sinus rhythm QRS Axis: Leftward axis Intervals: normal ST/T Wave abnormalities: normal Narrative Interpretation: no evidence of acute ischemia  ____________________________________________  RADIOLOGY  CT abdomen pelvis Noncon reviewed by me with no acute disease ____________________________________________   PROCEDURES  Procedure(s) performed: no  Procedures  Critical Care performed: no  Observation: no ____________________________________________   INITIAL IMPRESSION / ASSESSMENT AND PLAN / ED COURSE  Pertinent labs & imaging results that were available during my care of the patient were reviewed by me and considered in my medical decision making (see chart for details).  The  patient has significant left upper quadrant tenderness.  Bedside ultrasound shows a normal gallbladder with no stones.  We will treat her symptomatically for strict disease and reevaluate in half hour.  Will require a CT scan.     ----------------------------------------- 4:39 PM on 05/27/2017 -----------------------------------------  The patient's pain and nausea are worse.  CT scan is pending. ____________________________________________  ----------------------------------------- 6:01 PM on 05/27/2017 -----------------------------------------  The patient's CT scan is reassuring with no clear etiology of her symptoms.  She is able to tolerate some food now.  I had a lengthy discussion with the patient regarding the diagnostic uncertainty and that I have encouraged her to continue her omeprazole because she is only on day 5.  She does have a gastroenterologist she can follow-up with in the next week for reevaluation.  Strict return precautions have been given and patient verbalized understand.  She declines a prescription for opiate pain medication.  FINAL CLINICAL IMPRESSION(S) / ED DIAGNOSES  Final diagnoses:  Epigastric pain      NEW MEDICATIONS STARTED DURING THIS VISIT:  Discharge Medication List as of 05/27/2017  6:01 PM       Note:  This document was prepared  using Systems analyst and may include unintentional dictation errors.     Darel Hong, MD 05/28/17 2131

## 2017-05-27 NOTE — Discharge Instructions (Signed)
Fortunately today your blood work and your CT scan were very reassuring.  Do not know exactly why you are having so much pain, so it is critically important that you follow-up with your gastroenterologist within the next week for reevaluation.  Please make sure you return to the emergency department for any new or worsening symptoms such as fevers, chills, worsening pain, if you cannot eat or drink, or for any other issues whatsoever.  It was a pleasure to take care of you today, and thank you for coming to our emergency department.  If you have any questions or concerns before leaving please ask the nurse to grab me and I'm more than happy to go through your aftercare instructions again.  If you were prescribed any opioid pain medication today such as Norco, Vicodin, Percocet, morphine, hydrocodone, or oxycodone please make sure you do not drive when you are taking this medication as it can alter your ability to drive safely.  If you have any concerns once you are home that you are not improving or are in fact getting worse before you can make it to your follow-up appointment, please do not hesitate to call 911 and come back for further evaluation.  Darel Hong, MD  Results for orders placed or performed during the hospital encounter of 05/27/17  Lipase, blood  Result Value Ref Range   Lipase 25 11 - 51 U/L  Comprehensive metabolic panel  Result Value Ref Range   Sodium 139 135 - 145 mmol/L   Potassium 4.0 3.5 - 5.1 mmol/L   Chloride 106 101 - 111 mmol/L   CO2 24 22 - 32 mmol/L   Glucose, Bld 87 65 - 99 mg/dL   BUN 12 6 - 20 mg/dL   Creatinine, Ser 0.65 0.44 - 1.00 mg/dL   Calcium 8.7 (L) 8.9 - 10.3 mg/dL   Total Protein 7.7 6.5 - 8.1 g/dL   Albumin 4.0 3.5 - 5.0 g/dL   AST 21 15 - 41 U/L   ALT 22 14 - 54 U/L   Alkaline Phosphatase 39 38 - 126 U/L   Total Bilirubin 0.9 0.3 - 1.2 mg/dL   GFR calc non Af Amer >60 >60 mL/min   GFR calc Af Amer >60 >60 mL/min   Anion gap 9 5 - 15    CBC  Result Value Ref Range   WBC 8.4 3.6 - 11.0 K/uL   RBC 4.87 3.80 - 5.20 MIL/uL   Hemoglobin 14.2 12.0 - 16.0 g/dL   HCT 42.5 35.0 - 47.0 %   MCV 87.2 80.0 - 100.0 fL   MCH 29.1 26.0 - 34.0 pg   MCHC 33.3 32.0 - 36.0 g/dL   RDW 13.7 11.5 - 14.5 %   Platelets 334 150 - 440 K/uL  Bilirubin, direct  Result Value Ref Range   Bilirubin, Direct 0.1 0.1 - 0.5 mg/dL  Troponin I  Result Value Ref Range   Troponin I <0.03 <0.03 ng/mL   Ct Abdomen Pelvis Wo Contrast  Result Date: 05/27/2017 CLINICAL DATA:  Epigastric pain and abdominal distention for 2 weeks. Diarrhea. EXAM: CT ABDOMEN AND PELVIS WITHOUT CONTRAST TECHNIQUE: Multidetector CT imaging of the abdomen and pelvis was performed following the standard protocol without IV contrast. COMPARISON:  12/28/2015 FINDINGS: Lower chest: No acute findings. Hepatobiliary: No mass visualized on this unenhanced exam. Gallbladder is unremarkable. Pancreas: No mass or inflammatory process visualized on this unenhanced exam. Spleen:  Within normal limits in size. Adrenals/Urinary tract: No evidence of urolithiasis  or hydronephrosis. Unremarkable unopacified urinary bladder. Stomach/Bowel: No evidence of obstruction, inflammatory process, or abnormal fluid collections. Vascular/Lymphatic: No pathologically enlarged lymph nodes identified. No evidence of abdominal aortic aneurysm. Aortic atherosclerosis. Reproductive: Prior hysterectomy noted. Adnexal regions are unremarkable in appearance. Other:  None. Musculoskeletal:  No suspicious bone lesions identified. IMPRESSION: No evidence of urolithiasis, hydronephrosis, or other acute findings. Electronically Signed   By: Earle Gell M.D.   On: 05/27/2017 17:35

## 2017-05-27 NOTE — ED Triage Notes (Signed)
Pt reports intermittent mid epigastric pain for the past several weeks. Pt reports pain will go through to her back and into her left arm. Pt reports has seen her MD and was told it could be an ulcer or her gallbladder.

## 2017-05-27 NOTE — ED Notes (Signed)

## 2017-05-28 ENCOUNTER — Other Ambulatory Visit: Payer: Self-pay | Admitting: Gastroenterology

## 2017-05-28 DIAGNOSIS — R1013 Epigastric pain: Secondary | ICD-10-CM

## 2017-06-04 ENCOUNTER — Ambulatory Visit
Admission: RE | Admit: 2017-06-04 | Discharge: 2017-06-04 | Disposition: A | Discharge status: Home / Self Care | Payer: PRIVATE HEALTH INSURANCE | Source: Ambulatory Visit | Attending: Gastroenterology | Admitting: Gastroenterology

## 2017-06-04 DIAGNOSIS — R1013 Epigastric pain: Secondary | ICD-10-CM

## 2017-06-04 IMAGING — NM NM HEPATO W/GB/PHARM/[PERSON_NAME]
2 series · 12 of 12 positions shown · non-contrast
Comparison: [DATE]

CLINICAL DATA: Abdominal pain with nausea and vomiting for 2
months.

EXAM:
NUCLEAR MEDICINE HEPATOBILIARY IMAGING WITH GALLBLADDER EF
TECHNIQUE: Sequential images of the abdomen were obtained [DATE] minutes
following intravenous administration of radiopharmaceutical. After
oral ingestion of Ensure, gallbladder ejection fraction was
determined. At 60 min, normal ejection fraction is greater than 33%.
RADIOPHARMACEUTICALS:  5.0 mCi [NE]  Choletec IV

[Series 1000: hepatobiliary scan · 9.59mm/px · 6 of 60 frames shown]
[frame 6/60]
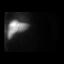
[frame 16/60]
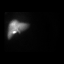
[frame 26/60]
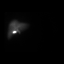
[frame 36/60]
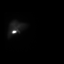
[frame 46/60]
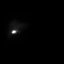
[frame 56/60]
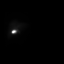

[Series 1000: gallbladder ef · 4.80mm/px · 6 of 120 frames shown]
[frame 11/120]
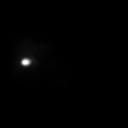
[frame 31/120]
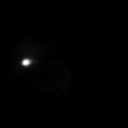
[frame 51/120]
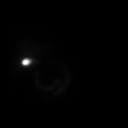
[frame 71/120]
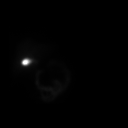
[frame 91/120]
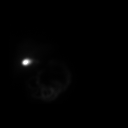
[frame 111/120]
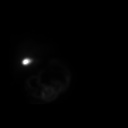

[12 of 12 positions shown; findings below may reference images not displayed]

FINDINGS: Prompt uptake and biliary excretion of activity by the liver is
seen. Gallbladder activity is visualized, consistent with patency of
cystic duct. Biliary activity passes into small bowel immediately
following ensure ingestion of Ensure, consistent with patent common
bile duct.

Calculated gallbladder ejection fraction is 15%. (Normal gallbladder
ejection fraction with Ensure is greater than 33%.)
IMPRESSION: No evidence of acute cholecystitis or biliary obstruction.

Decreased gallbladder ejection fraction of 15%.

## 2017-06-04 MED ORDER — TECHNETIUM TC 99M MEBROFENIN IV KIT
5.0200 | PACK | Freq: Once | INTRAVENOUS | Status: AC | PRN
  Administered 2017-06-04: 5.02 via INTRAVENOUS

## 2017-06-08 ENCOUNTER — Encounter: Payer: Self-pay | Admitting: *Deleted

## 2017-06-11 ENCOUNTER — Encounter
Admission: RE | Disposition: A | Discharge status: Home / Self Care | Payer: Self-pay | Source: Ambulatory Visit | Attending: Gastroenterology

## 2017-06-11 ENCOUNTER — Ambulatory Visit: Payer: PRIVATE HEALTH INSURANCE | Admitting: Anesthesiology

## 2017-06-11 ENCOUNTER — Encounter: Payer: Self-pay | Admitting: Anesthesiology

## 2017-06-11 ENCOUNTER — Ambulatory Visit
Admission: RE | Admit: 2017-06-11 | Discharge: 2017-06-11 | Disposition: A | Discharge status: Home / Self Care | Payer: PRIVATE HEALTH INSURANCE | Source: Ambulatory Visit | Attending: Gastroenterology | Admitting: Gastroenterology

## 2017-06-11 DIAGNOSIS — K2950 Unspecified chronic gastritis without bleeding: Secondary | ICD-10-CM | POA: Diagnosis not present

## 2017-06-11 DIAGNOSIS — I471 Supraventricular tachycardia: Secondary | ICD-10-CM | POA: Diagnosis not present

## 2017-06-11 DIAGNOSIS — R1013 Epigastric pain: Secondary | ICD-10-CM | POA: Diagnosis present

## 2017-06-11 DIAGNOSIS — K449 Diaphragmatic hernia without obstruction or gangrene: Secondary | ICD-10-CM | POA: Insufficient documentation

## 2017-06-11 DIAGNOSIS — K219 Gastro-esophageal reflux disease without esophagitis: Secondary | ICD-10-CM | POA: Diagnosis not present

## 2017-06-11 DIAGNOSIS — Z79899 Other long term (current) drug therapy: Secondary | ICD-10-CM | POA: Insufficient documentation

## 2017-06-11 DIAGNOSIS — K589 Irritable bowel syndrome without diarrhea: Secondary | ICD-10-CM | POA: Diagnosis not present

## 2017-06-11 DIAGNOSIS — I10 Essential (primary) hypertension: Secondary | ICD-10-CM | POA: Insufficient documentation

## 2017-06-11 DIAGNOSIS — G4730 Sleep apnea, unspecified: Secondary | ICD-10-CM | POA: Diagnosis not present

## 2017-06-11 HISTORY — DX: Headache: R51

## 2017-06-11 HISTORY — PX: ESOPHAGOGASTRODUODENOSCOPY (EGD) WITH PROPOFOL: SHX5813

## 2017-06-11 HISTORY — DX: Gastro-esophageal reflux disease without esophagitis: K21.9

## 2017-06-11 HISTORY — DX: Headache, unspecified: R51.9

## 2017-06-11 SURGERY — ESOPHAGOGASTRODUODENOSCOPY (EGD) WITH PROPOFOL
Anesthesia: General

## 2017-06-11 MED ORDER — KETAMINE HCL 50 MG/ML IJ SOLN
INTRAMUSCULAR | Status: DC | PRN
  Administered 2017-06-11: 20 mg via INTRAMUSCULAR

## 2017-06-11 MED ORDER — LIDOCAINE HCL (CARDIAC) 20 MG/ML IV SOLN
INTRAVENOUS | Status: DC | PRN
  Administered 2017-06-11: 100 mg via INTRAVENOUS

## 2017-06-11 MED ORDER — PROPOFOL 10 MG/ML IV BOLUS
INTRAVENOUS | Status: DC | PRN
  Administered 2017-06-11: 80 mg via INTRAVENOUS

## 2017-06-11 MED ORDER — LIDOCAINE HCL (PF) 2 % IJ SOLN
INTRAMUSCULAR | Status: AC
  Filled 2017-06-11: qty 10

## 2017-06-11 MED ORDER — PROPOFOL 500 MG/50ML IV EMUL
INTRAVENOUS | Status: AC
  Filled 2017-06-11: qty 50

## 2017-06-11 MED ORDER — PROPOFOL 500 MG/50ML IV EMUL
INTRAVENOUS | Status: DC | PRN
  Administered 2017-06-11: 150 ug/kg/min via INTRAVENOUS

## 2017-06-11 MED ORDER — SODIUM CHLORIDE 0.9 % IV SOLN: INTRAVENOUS | Status: DC

## 2017-06-11 MED ORDER — SODIUM CHLORIDE 0.9 % IV SOLN
INTRAVENOUS | Status: DC
  Administered 2017-06-11: 11:00:00 via INTRAVENOUS

## 2017-06-11 NOTE — Anesthesia Postprocedure Evaluation (Signed)
Anesthesia Post Note  Patient: Ashlee Everett  Procedure(s) Performed: ESOPHAGOGASTRODUODENOSCOPY (EGD) WITH PROPOFOL (N/A )  Patient location during evaluation: PACU Anesthesia Type: General Level of consciousness: awake and alert and oriented Pain management: pain level controlled Vital Signs Assessment: post-procedure vital signs reviewed and stable Respiratory status: spontaneous breathing Cardiovascular status: blood pressure returned to baseline Anesthetic complications: no     Last Vitals:  Vitals:   06/11/17 1200 06/11/17 1210  BP: (!) 146/86 (!) 148/82  Pulse: 61 66  Resp: 14 20  Temp:    SpO2: 100% 100%    Last Pain:  Vitals:   06/11/17 1120  TempSrc: Tympanic                 Tomislav Micale

## 2017-06-11 NOTE — Op Note (Signed)
[facility] Gastroenterology Patient Name: Ashlee Everett Procedure Date: 06/11/2017 10:37 AM MRN: [id number] Account #: [id number] Date of Birth: [birth year] Admit Type: Outpatient Age: 47 Room: [facility] ENDO ROOM 1 Gender: Female Note Status: Finalized Procedure:            Upper GI endoscopy Indications:          Epigastric abdominal pain, Abdominal pain in the right                        upper quadrant, Dyspepsia Providers:            Lollie Sails, MD Referring MD:         Caprice Renshaw MD (Referring MD) Medicines:            Monitored Anesthesia Care Complications:        No immediate complications. Procedure:            Pre-Anesthesia Assessment:                       - ASA Grade Assessment: III - A patient with severe                        systemic disease.                       After obtaining informed consent, the endoscope was                        passed under direct vision. Throughout the procedure,                        the patient's blood pressure, pulse, and oxygen                        saturations were monitored continuously. The Endoscope                        was introduced through the mouth, and advanced to the                        third part of duodenum. The upper GI endoscopy was                        accomplished without difficulty. The patient tolerated                        the procedure well. Findings:      A small hiatal hernia was found. The Z-line was a variable distance from       incisors; the hiatal hernia was sliding.      LA Grade A (one or more mucosal breaks less than 5 mm, not extending       between tops of 2 mucosal folds) esophagitis with no bleeding was found.       Biopsies were taken with a cold forceps for histology.      Diffuse and patchy minimal inflammation was found in the gastric body.       Biopsies were taken with a cold forceps for histology.      The cardia and gastric fundus were normal on  retroflexion.      Diffuse mild mucosal  variance characterized by smoothness was found in       the entire duodenum. Biopsies were taken with a cold forceps for       histology. Impression:           - Small hiatal hernia.                       - No specimens collected. Recommendation:       - Discharge patient to home.                       - Continue present medications. Procedure Code(s):    --- Professional ---                       [phone], Esophagogastroduodenoscopy, flexible, transoral;                        with biopsy, single or multiple Diagnosis Code(s):    --- Professional ---                       K44.9, Diaphragmatic hernia without obstruction or                        gangrene                       R10.13, Epigastric pain                       R10.11, Right upper quadrant pain CPT copyright 2016 American Medical Association. All rights reserved. The codes documented in this report are preliminary and upon coder review may  be revised to meet current compliance requirements. Lollie Sails, MD 06/11/2017 11:21:26 AM This report has been signed electronically. Number of Addenda: 0 Note Initiated On: 06/11/2017 10:37 AM      [facility]

## 2017-06-11 NOTE — Transfer of Care (Signed)
Immediate Anesthesia Transfer of Care Note  Patient: Ashlee Everett  Procedure(s) Performed: ESOPHAGOGASTRODUODENOSCOPY (EGD) WITH PROPOFOL (N/A )  Patient Location: PACU and Endoscopy Unit  Anesthesia Type:General  Level of Consciousness: awake  Airway & Oxygen Therapy: Patient Spontanous Breathing  Post-op Assessment: Report given to RN  Post vital signs: stable  Last Vitals:  Vitals:   06/11/17 1035  BP: (!) 158/90  Pulse: (!) 51  Resp: 16  Temp: 36.6 C  SpO2: 100%    Last Pain:  Vitals:   06/11/17 1035  TempSrc: Tympanic         Complications: No apparent anesthesia complications

## 2017-06-11 NOTE — H&P (Signed)
Outpatient short stay form Pre-procedure 06/11/2017 10:44 AM Lollie Sails MD  Primary Physician: Dr Derinda Late  Reason for visit: EGD  History of present illness: Patient is a 47 year old female presenting today as above.  She has personal history of epigastric pain and dyspepsia.  She has been taking a proton pump inhibitor, omeprazole 20 mg a day that was recently changed to pantoprazole 40 mg a day.  Her symptoms include epigastric pain as well as a fullness in the right upper quadrant.  Some of the epigastric discomfort will radiate straight through to the back and up under her shoulder blades.  Occasionally takes Advil she takes no aspirin products or blood thinning agent.    Current Facility-Administered Medications:  .  0.9 %  sodium chloride infusion, , Intravenous, Continuous, Lollie Sails, MD .  0.9 %  sodium chloride infusion, , Intravenous, Continuous, Lollie Sails, MD  Medications Prior to Admission  Medication Sig Dispense Refill Last Dose  . ibuprofen (ADVIL,MOTRIN) 200 MG tablet Take 400 mg by mouth every 6 (six) hours as needed for mild pain.   Past Month at Unknown time  . metoprolol succinate (TOPROL-XL) 100 MG 24 hr tablet Take 100 mg by mouth daily. Take with or immediately following a meal.   06/11/2017 at 0730     Allergies  Allergen Reactions  . Fioricet [Butalbital-Apap-Caffeine] Shortness Of Breath and Nausea And Vomiting     Past Medical History:  Diagnosis Date  . Diverticulitis   . GERD (gastroesophageal reflux disease)   . Headache   . Hypertension   . IBS (irritable bowel syndrome)   . Paroxysmal SVT (supraventricular tachycardia) ([place])   . Sleep apnea     Review of systems:      Physical Exam    Heart and lungs: Regular rate and rhythm without rub or gallop, lungs are bilaterally clear.    HEENT: Normocephalic atraumatic eyes are anicteric    Other:    Pertinant exam for procedure: Soft, mild tenderness to palpation  in the epigastric region and right upper quadrant.  There are no masses or rebound.  Bowel sounds are positive normoactive.    Planned proceedures: EGD and indicated procedures. I have discussed the risks benefits and complications of procedures to include not limited to bleeding, infection, perforation and the risk of sedation and the patient wishes to proceed.    Lollie Sails, MD Gastroenterology 06/11/2017  10:44 AM

## 2017-06-11 NOTE — Anesthesia Preprocedure Evaluation (Addendum)
Anesthesia Evaluation  Patient identified by MRN, date of birth, ID band Patient awake    Reviewed: Allergy & Precautions, NPO status , Patient's Chart, lab work & pertinent test results, reviewed documented beta blocker date and time   Airway Mallampati: III  TM Distance: <3 FB     Dental  (+) Teeth Intact   Pulmonary sleep apnea ,    Pulmonary exam normal        Cardiovascular hypertension, Pt. on medications + angina Normal cardiovascular exam+ dysrhythmias Supra Ventricular Tachycardia      Neuro/Psych  Headaches, negative psych ROS   GI/Hepatic Neg liver ROS, GERD  ,diverticulitis   Endo/Other  negative endocrine ROSMorbid obesity  Renal/GU negative Renal ROS     Musculoskeletal negative musculoskeletal ROS (+)   Abdominal (+) + obese,   Peds  Hematology negative hematology ROS (+)   Anesthesia Other Findings   Reproductive/Obstetrics                           Anesthesia Physical Anesthesia Plan  ASA: III  Anesthesia Plan: General   Post-op Pain Management:    Induction: Intravenous  PONV Risk Score and Plan:   Airway Management Planned: Nasal Cannula  Additional Equipment:   Intra-op Plan:   Post-operative Plan:   Informed Consent: I have reviewed the patients History and Physical, chart, labs and discussed the procedure including the risks, benefits and alternatives for the proposed anesthesia with the patient or authorized representative who has indicated his/her understanding and acceptance.   Dental advisory given  Plan Discussed with: CRNA and Surgeon  Anesthesia Plan Comments:         Anesthesia Quick Evaluation

## 2017-06-11 NOTE — Anesthesia Post-op Follow-up Note (Signed)
Anesthesia QCDR form completed.        

## 2017-06-12 ENCOUNTER — Encounter: Payer: Self-pay | Admitting: Gastroenterology

## 2017-06-13 LAB — SURGICAL PATHOLOGY

## 2017-06-14 ENCOUNTER — Ambulatory Visit: Payer: Self-pay | Admitting: General Surgery

## 2017-06-14 NOTE — H&P (View-Only) (Signed)
PATIENT PROFILE: Ashlee Everett is a 47 y.o. female who presents to the Clinic for consultation at the request of Dr. Gustavo Lah for evaluation of biliary dyskinesia.  PCP:  Barnabas Lister, MD  HISTORY OF PRESENT ILLNESS: Ms. Ashlee Everett reports feeling pain on the abdomen since a month ago. The patient refers that the pain is localized on the epigastric area and radiates to the back and goes up to the left shoulder. Pain is aggravated with food intake. It starts 1-2 hours after eating. Anti acid medications is not helping with the pain. Pain is also associated with nausea. Nothing has improve the pain which is there almost all day. Denies fever or chills.    PROBLEM LIST:         Problem List  Date Reviewed: 05/28/2017         Noted   Morbid obesity with BMI of 45.0-49.9 ([place]) (Chronic) 07/07/2015   Chest pain, atypical 03/15/2015   Supraventricular tachycardia (CMS-HCC) 02/16/2015   Chest pain, unspecified 02/16/2015   Obstructive sleep apnea (adult) (pediatric) 08/11/2013   Essential hypertension, benign 08/11/2013      GENERAL REVIEW OF SYSTEMS:   General ROS: negative for - chills, fatigue, fever, weight gain or weight loss Allergy and Immunology ROS: negative for - hives  Hematological and Lymphatic ROS: negative for - bleeding problems or bruising, negative for palpable nodes Endocrine ROS: negative for - heat or cold intolerance, hair changes Respiratory ROS: negative for - cough, shortness of breath or wheezing Cardiovascular ROS: no chest pain or palpitations GI ROS: See HPI Musculoskeletal ROS: negative for - joint swelling or muscle pain Neurological ROS: negative for - confusion, syncope Dermatological ROS: negative for pruritus and rash Psychiatric: negative for anxiety, depression, difficulty sleeping and memory loss  MEDICATIONS: CurrentMedications        Current Outpatient Medications  Medication Sig Dispense Refill  . acetaminophen  (TYLENOL) 500 MG tablet Take 500 mg by mouth as needed for Pain    . ibuprofen (ADVIL,MOTRIN) 200 MG tablet Take by mouth.    . metoprolol tartrate (LOPRESSOR) 25 MG tablet Take 1 tablet (25 mg total) by mouth 2 (two) times daily 60 tablet 0  . pantoprazole (PROTONIX) 40 MG DR tablet Take 1 tablet (40 mg total) by mouth once daily Take 15-20 mins before meal 30 tablet 11   No current facility-administered medications for this visit.       ALLERGIES: Fioricet [butalbital-acetaminophen-caff]  PAST MEDICAL HISTORY:     Past Medical History:  Diagnosis Date  . Diverticulitis 12/06  . H/O tension headache    With normal CT with CTA 1/12.  Marland Kitchen History of hemorrhoids   . Hypertension    Diagnosed 06/2011.  . IBS (irritable bowel syndrome)   . Paroxysmal SVT (supraventricular tachycardia) (CMS-HCC)    Stable.  . Sleep apnea    Mild sleep apnea by sleep study 10/06. - uses CPAP    PAST SURGICAL HISTORY:      Past Surgical History:  Procedure Laterality Date  . APPENDECTOMY  January 2006   Done with laparoscopy for removal of ovary and fallopian tube.  Marland Kitchen HYSTERECTOMY    . Laparoscopic lysis of adhesions  January 2006   With removal of remaining ovary and fallopian tube, and appendectomy.  . Left salpingo-oophorectomy    . SIGMOID RESECTION / RECTOPEXY  02/08  . TUBAL LIGATION       FAMILY HISTORY:      Family History  Problem Relation Age  of Onset  . High blood pressure (Hypertension) Mother   . Diabetes type II Mother   . Lung cancer Father   . Myocardial Infarction (Heart attack) Father   . Hiatal hernia Maternal Grandmother      SOCIAL HISTORY: Social History          Socioeconomic History  . Marital status: Married    Spouse name: Not on file  . Number of children: Not on file  . Years of education: Not on file  . Highest education level: Not on file  Occupational History  . Not on file  Social Needs  . Financial  resource strain: Not on file  . Food insecurity:    Worry: Not on file    Inability: Not on file  . Transportation needs:    Medical: Not on file    Non-medical: Not on file  Tobacco Use  . Smoking status: Never Smoker  . Smokeless tobacco: Never Used  Substance and Sexual Activity  . Alcohol use: No    Alcohol/week: 0.0 oz  . Drug use: No  . Sexual activity: Not on file  Other Topics Concern  . Not on file  Social History Narrative  . Not on file      PHYSICAL EXAM:    Vitals:   06/14/17 1500  BP: (!) 173/95  Pulse: 74  Temp: 36.8 C (98.2 F)   Body mass index is 48.37 kg/m. Weight: (!) 116.1 kg (256 lb)   GENERAL: Alert, active, oriented x3  HEENT: Pupils equal reactive to light. Extraocular movements are intact. Sclera clear. Palpebral conjunctiva normal red color.Pharynx clear.  NECK: Supple with no palpable mass and no adenopathy.  LUNGS: Sound clear with no rales rhonchi or wheezes.  HEART: Regular rhythm S1 and S2 without murmur.  ABDOMEN: Soft and depressible, nontender with no palpable mass, no hepatomegaly. Healed scar from laparoscopic surgery.   EXTREMITIES: Well-developed well-nourished symmetrical with no dependent edema.  NEUROLOGICAL: Awake alert oriented, facial expression symmetrical, moving all extremities.  REVIEW OF DATA: I have reviewed the following data today:      Office Visit on 05/23/2017  Component Date Value  . WBC (White Blood Cell Co* 05/23/2017 10.1   . RBC (Red Blood Cell Coun* 05/23/2017 4.66   . Hemoglobin 05/23/2017 13.8   . Hematocrit 05/23/2017 41.8   . MCV (Mean Corpuscular Vo* 05/23/2017 89.7   . MCH (Mean Corpuscular He* 05/23/2017 29.6   . MCHC (Mean Corpuscular H* 05/23/2017 33.0   . Platelet Count 05/23/2017 385   . RDW-CV (Red Cell Distrib* 05/23/2017 12.7   . MPV (Mean Platelet Volum* 05/23/2017 8.4*  . Neutrophils 05/23/2017 5.80   . Lymphocytes 05/23/2017 3.30   . Mixed Count  05/23/2017 1.00*  . Neutrophil % 05/23/2017 56.8   . Lymphocyte % 05/23/2017 32.9   . Mixed % 05/23/2017 10.3   . Glucose 05/23/2017 72   . Sodium 05/23/2017 139   . Potassium 05/23/2017 4.2   . Chloride 05/23/2017 103   . Carbon Dioxide (CO2) 05/23/2017 32.0   . Urea Nitrogen (BUN) 05/23/2017 11   . Creatinine 05/23/2017 0.7   . Glomerular Filtration Ra* 05/23/2017 89   . Calcium 05/23/2017 9.1   . AST  05/23/2017 12   . ALT  05/23/2017 11   . Alk Phos (alkaline Phosp* 05/23/2017 39   . Albumin 05/23/2017 4.0   . Bilirubin, Total 05/23/2017 0.6   . Protein, Total 05/23/2017 7.1   . A/G Ratio  05/23/2017 1.3   . Lipase 05/23/2017 25   . Helicobacter Pylori Anti* 05/23/2017 Negative      ASSESSMENT: Ms. Dant is a 47 y.o. female presenting for consultation for gallbladder dyskinesia.    Patient was oriented about the diagnosis of biliary dyskinesia. Also oriented about what is the gallbladder, its anatomy and function and the implication of having a gallbladder low ejection fraction. The patient was oriented about the treatment alternatives (observation vs cholecystectomy). Patient was oriented that a low percentage of patient will continue to have similar pain symptmos even after the gallbladder is removed. And in her case the left shoulder pain with tingling of the left arm should not improve with surgery. Surgical technique (open vs laparoscopic) was discussed. It was also discussed the goals of the surgery (decrease the pain episodes and avoid the risk of cholecystitis) and the risk of surgery including: bleeding, infection, common bile duct injury, injury to other organs such as bowel, liver, stomach, other complications such as hernia, bowel obstruction among others. Also discussed with patient about anesthesia and its complications such as: reaction to medications, pneumonia, heart complications, death, among others.  PLAN: 1. Laparoscopic cholecystectomy 47562 2. CBC, CMP  done 05/23/17 3. Internal Medicine Clearance  4. Avoid aspirin intake 5 days prior to surgery 5. Avoid fatty food as possible.   Patient verbalized understanding, all questions were answered, and were agreeable with the plan outlined above.   Herbert Pun, MD  Electronically signed by Herbert Pun, MD

## 2017-06-14 NOTE — H&P (Signed)
PATIENT PROFILE: Ashlee Everett is a 47 y.o. female who presents to the Clinic for consultation at the request of Dr. Skulskie for evaluation of biliary dyskinesia.  PCP:  Babaoff, Marcus Elijah, MD  HISTORY OF PRESENT ILLNESS: Ms. Marrone reports feeling pain on the abdomen since a month ago. The patient refers that the pain is localized on the epigastric area and radiates to the back and goes up to the left shoulder. Pain is aggravated with food intake. It starts 1-2 hours after eating. Anti acid medications is not helping with the pain. Pain is also associated with nausea. Nothing has improve the pain which is there almost all day. Denies fever or chills.    PROBLEM LIST:         Problem List  Date Reviewed: 05/28/2017         Noted   Morbid obesity with BMI of 45.0-49.9 (HCC) (Chronic) 07/07/2015   Chest pain, atypical 03/15/2015   Supraventricular tachycardia (CMS-HCC) 02/16/2015   Chest pain, unspecified 02/16/2015   Obstructive sleep apnea (adult) (pediatric) 08/11/2013   Essential hypertension, benign 08/11/2013      GENERAL REVIEW OF SYSTEMS:   General ROS: negative for - chills, fatigue, fever, weight gain or weight loss Allergy and Immunology ROS: negative for - hives  Hematological and Lymphatic ROS: negative for - bleeding problems or bruising, negative for palpable nodes Endocrine ROS: negative for - heat or cold intolerance, hair changes Respiratory ROS: negative for - cough, shortness of breath or wheezing Cardiovascular ROS: no chest pain or palpitations GI ROS: See HPI Musculoskeletal ROS: negative for - joint swelling or muscle pain Neurological ROS: negative for - confusion, syncope Dermatological ROS: negative for pruritus and rash Psychiatric: negative for anxiety, depression, difficulty sleeping and memory loss  MEDICATIONS: CurrentMedications        Current Outpatient Medications  Medication Sig Dispense Refill  . acetaminophen  (TYLENOL) 500 MG tablet Take 500 mg by mouth as needed for Pain    . ibuprofen (ADVIL,MOTRIN) 200 MG tablet Take by mouth.    . metoprolol tartrate (LOPRESSOR) 25 MG tablet Take 1 tablet (25 mg total) by mouth 2 (two) times daily 60 tablet 0  . pantoprazole (PROTONIX) 40 MG DR tablet Take 1 tablet (40 mg total) by mouth once daily Take 15-20 mins before meal 30 tablet 11   No current facility-administered medications for this visit.       ALLERGIES: Fioricet [butalbital-acetaminophen-caff]  PAST MEDICAL HISTORY:     Past Medical History:  Diagnosis Date  . Diverticulitis 12/06  . H/O tension headache    With normal CT with CTA 1/12.  . History of hemorrhoids   . Hypertension    Diagnosed 06/2011.  . IBS (irritable bowel syndrome)   . Paroxysmal SVT (supraventricular tachycardia) (CMS-HCC)    Stable.  . Sleep apnea    Mild sleep apnea by sleep study 10/06. - uses CPAP    PAST SURGICAL HISTORY:      Past Surgical History:  Procedure Laterality Date  . APPENDECTOMY  January 2006   Done with laparoscopy for removal of ovary and fallopian tube.  . HYSTERECTOMY    . Laparoscopic lysis of adhesions  January 2006   With removal of remaining ovary and fallopian tube, and appendectomy.  . Left salpingo-oophorectomy    . SIGMOID RESECTION / RECTOPEXY  02/08  . TUBAL LIGATION       FAMILY HISTORY:      Family History  Problem Relation Age   of Onset  . High blood pressure (Hypertension) Mother   . Diabetes type II Mother   . Lung cancer Father   . Myocardial Infarction (Heart attack) Father   . Hiatal hernia Maternal Grandmother      SOCIAL HISTORY: Social History          Socioeconomic History  . Marital status: Married    Spouse name: Not on file  . Number of children: Not on file  . Years of education: Not on file  . Highest education level: Not on file  Occupational History  . Not on file  Social Needs  . Financial  resource strain: Not on file  . Food insecurity:    Worry: Not on file    Inability: Not on file  . Transportation needs:    Medical: Not on file    Non-medical: Not on file  Tobacco Use  . Smoking status: Never Smoker  . Smokeless tobacco: Never Used  Substance and Sexual Activity  . Alcohol use: No    Alcohol/week: 0.0 oz  . Drug use: No  . Sexual activity: Not on file  Other Topics Concern  . Not on file  Social History Narrative  . Not on file      PHYSICAL EXAM:    Vitals:   06/14/17 1500  BP: (!) 173/95  Pulse: 74  Temp: 36.8 C (98.2 F)   Body mass index is 48.37 kg/m. Weight: (!) 116.1 kg (256 lb)   GENERAL: Alert, active, oriented x3  HEENT: Pupils equal reactive to light. Extraocular movements are intact. Sclera clear. Palpebral conjunctiva normal red color.Pharynx clear.  NECK: Supple with no palpable mass and no adenopathy.  LUNGS: Sound clear with no rales rhonchi or wheezes.  HEART: Regular rhythm S1 and S2 without murmur.  ABDOMEN: Soft and depressible, nontender with no palpable mass, no hepatomegaly. Healed scar from laparoscopic surgery.   EXTREMITIES: Well-developed well-nourished symmetrical with no dependent edema.  NEUROLOGICAL: Awake alert oriented, facial expression symmetrical, moving all extremities.  REVIEW OF DATA: I have reviewed the following data today:      Office Visit on 05/23/2017  Component Date Value  . WBC (White Blood Cell Co* 05/23/2017 10.1   . RBC (Red Blood Cell Coun* 05/23/2017 4.66   . Hemoglobin 05/23/2017 13.8   . Hematocrit 05/23/2017 41.8   . MCV (Mean Corpuscular Vo* 05/23/2017 89.7   . MCH (Mean Corpuscular He* 05/23/2017 29.6   . MCHC (Mean Corpuscular H* 05/23/2017 33.0   . Platelet Count 05/23/2017 385   . RDW-CV (Red Cell Distrib* 05/23/2017 12.7   . MPV (Mean Platelet Volum* 05/23/2017 8.4*  . Neutrophils 05/23/2017 5.80   . Lymphocytes 05/23/2017 3.30   . Mixed Count  05/23/2017 1.00*  . Neutrophil % 05/23/2017 56.8   . Lymphocyte % 05/23/2017 32.9   . Mixed % 05/23/2017 10.3   . Glucose 05/23/2017 72   . Sodium 05/23/2017 139   . Potassium 05/23/2017 4.2   . Chloride 05/23/2017 103   . Carbon Dioxide (CO2) 05/23/2017 32.0   . Urea Nitrogen (BUN) 05/23/2017 11   . Creatinine 05/23/2017 0.7   . Glomerular Filtration Ra* 05/23/2017 89   . Calcium 05/23/2017 9.1   . AST  05/23/2017 12   . ALT  05/23/2017 11   . Alk Phos (alkaline Phosp* 05/23/2017 39   . Albumin 05/23/2017 4.0   . Bilirubin, Total 05/23/2017 0.6   . Protein, Total 05/23/2017 7.1   . A/G Ratio   05/23/2017 1.3   . Lipase 05/23/2017 25   . Helicobacter Pylori Anti* 05/23/2017 Negative      ASSESSMENT: Ms. Dant is a 47 y.o. female presenting for consultation for gallbladder dyskinesia.    Patient was oriented about the diagnosis of biliary dyskinesia. Also oriented about what is the gallbladder, its anatomy and function and the implication of having a gallbladder low ejection fraction. The patient was oriented about the treatment alternatives (observation vs cholecystectomy). Patient was oriented that a low percentage of patient will continue to have similar pain symptmos even after the gallbladder is removed. And in her case the left shoulder pain with tingling of the left arm should not improve with surgery. Surgical technique (open vs laparoscopic) was discussed. It was also discussed the goals of the surgery (decrease the pain episodes and avoid the risk of cholecystitis) and the risk of surgery including: bleeding, infection, common bile duct injury, injury to other organs such as bowel, liver, stomach, other complications such as hernia, bowel obstruction among others. Also discussed with patient about anesthesia and its complications such as: reaction to medications, pneumonia, heart complications, death, among others.  PLAN: 1. Laparoscopic cholecystectomy 47562 2. CBC, CMP  done 05/23/17 3. Internal Medicine Clearance  4. Avoid aspirin intake 5 days prior to surgery 5. Avoid fatty food as possible.   Patient verbalized understanding, all questions were answered, and were agreeable with the plan outlined above.   Herbert Pun, MD  Electronically signed by Herbert Pun, MD

## 2017-06-18 ENCOUNTER — Encounter
Admission: RE | Admit: 2017-06-18 | Discharge: 2017-06-18 | Disposition: A | Discharge status: Home / Self Care | Payer: PRIVATE HEALTH INSURANCE | Source: Ambulatory Visit | Attending: General Surgery | Admitting: General Surgery

## 2017-06-18 ENCOUNTER — Other Ambulatory Visit: Payer: Self-pay

## 2017-06-18 HISTORY — DX: Angina pectoris, unspecified: I20.9

## 2017-06-18 HISTORY — DX: Malignant (primary) neoplasm, unspecified: C80.1

## 2017-06-18 HISTORY — DX: Personal history of other diseases of the digestive system: Z87.19

## 2017-06-18 NOTE — Pre-Procedure Instructions (Signed)
EKG  ED ECG REPORT I, Darel Hong, the attending physician, personally viewed and interpreted this ECG.  Date: 05/28/2017 EKG Time:  Rate: 75 Rhythm: normal sinus rhythm QRS Axis: Leftward axis Intervals: normal ST/T Wave abnormalities: normal Narrative Interpretation: no evidence of acute ischemia  ____________________________________________  RADIOLOGY  CT abdomen pelvis Noncon reviewed by me with no acute disease ____________________________________________   PROCEDURES  Procedure(s) performed: no  Procedures  Critical Care performed: no  Observation: no ____________________________________________   INITIAL IMPRESSION / ASSESSMENT AND PLAN / ED COURSE  Pertinent labs & imaging results that were available during my care of the patient were reviewed by me and considered in my medical decision making (see chart for details).  The patient has significant left upper quadrant tenderness.  Bedside ultrasound shows a normal gallbladder with no stones.  We will treat her symptomatically for strict disease and reevaluate in half hour.  Will require a CT scan.   ----------------------------------------- 4:39 PM on 05/27/2017 -----------------------------------------  The patient's pain and nausea are worse.  CT scan is pending. ____________________________________________  ----------------------------------------- 6:01 PM on 05/27/2017 -----------------------------------------  The patient's CT scan is reassuring with no clear etiology of her symptoms.  She is able to tolerate some food now.  I had a lengthy discussion with the patient regarding the diagnostic uncertainty and that I have encouraged her to continue her omeprazole because she is only on day 5.  She does have a gastroenterologist she can follow-up with in the next week for reevaluation.  Strict return precautions have been given and patient verbalized understand.  She declines a  prescription for opiate pain medication.  FINAL CLINICAL IMPRESSION(S) / ED DIAGNOSES  Final diagnoses:  Epigastric pain      NEW MEDICATIONS STARTED DURING THIS VISIT:  Discharge Medication List as of 05/27/2017  6:01 PM       Note:  This document was prepared using Dragon voice recognition software and may include unintentional dictation errors.     Darel Hong, MD 05/28/17 2131           Electronically signed by Darel Hong, MD at 05/28/2017 9:31 PM     ED on 05/27/2017        Detailed Report

## 2017-06-18 NOTE — Pre-Procedure Instructions (Signed)
Sydnee Levans, MD - 06/02/2016 1:15 PM EST Formatting of this note might be different from the original.   Chief Complaint: Chief Complaint  Patient presents with  . Follow-up  1 year doing okay having no problems  Date of Service: 06/02/2016 Date of Birth: 1971/03/13 PCP: Barnabas Lister, MD  History of Present Illness: Ms. Ashlee Everett is a 47 y.o.female patient who is referred for evaluation of an abnormal stress test. Patient has a history of chest pain. Risk factors include hypertension sleep apnea. 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 Patient underwent a functional study at [facility] after presenting with chest pain and having a trivial troponin elevation of 0.05. Ejection fraction was read at 50%. There was a suggestive of possible septal ischemia. Cardiac CT angiogram revealed normal coronaries. She has had no further chest pain. She occasionally will feel tachycardia has been diagnosed with supraventricular tachycardia. She remains on Toprol tartrate 25 mg twice daily and is doing well with this. The episodes last less than 10 minutes.  Past Medical and Surgical History  Past Medical History Past Medical History:  Diagnosis Date  . Diverticulitis 12/06  . H/O tension headache  With normal CT with CTA 1/12.  Marland Kitchen History of hemorrhoids  . Hypertension  Diagnosed 06/2011.  . IBS (irritable bowel syndrome)  . Paroxysmal SVT (supraventricular tachycardia) (CMS-HCC)  Stable.  . Sleep apnea  Mild sleep apnea by sleep study 10/06.   Past Surgical History She has a past surgical history that includes Tubal ligation; Hysterectomy; Sigmoid resection / rectopexy (02/08); Laparoscopic lysis of adhesions (January 2006); Appendectomy (January 2006); and Left salpingo-oophorectomy.   Medications and Allergies  Current Medications  Current Outpatient Prescriptions  Medication Sig Dispense Refill  . ibuprofen (ADVIL,MOTRIN) 200 MG tablet Take by mouth.  .  metoprolol tartrate (LOPRESSOR) 25 MG tablet Take 1 tablet (25 mg total) by mouth 2 (two) times daily. 60 tablet 11  . dicyclomine (BENTYL) 10 mg capsule Take 1 capsule (10 mg total) by mouth 4 (four) times daily before meals and nightly. (Patient not taking: Reported on 06/02/2016 ) 120 capsule 11   No current facility-administered medications for this visit.   Allergies: Fioricet [butalbital-acetaminophen-caff]  Social and Family History  Social History reports that she has never smoked. She has never used smokeless tobacco. She reports that she does not drink alcohol or use drugs.  Family History Family History  Problem Relation Age of Onset  . High blood pressure (Hypertension) Mother  . Diabetes type II Mother  . Lung cancer Father  . Myocardial Infarction (Heart attack) Father   Review of Systems  Review of Systems  Constitutional: Negative for chills, diaphoresis, fever, malaise/fatigue and weight loss.  HENT: Negative for congestion, ear discharge, hearing loss and tinnitus.  Eyes: Negative for blurred vision.  Respiratory: Positive for shortness of breath. Negative for cough, hemoptysis, sputum production and wheezing.  Cardiovascular: Positive for chest pain. Negative for palpitations, orthopnea, claudication, leg swelling and PND.  Gastrointestinal: Negative for abdominal pain, blood in stool, constipation, diarrhea, heartburn, melena, nausea and vomiting.  Genitourinary: Negative for dysuria, frequency, hematuria and urgency.  Musculoskeletal: Negative for back pain, falls, joint pain and myalgias.  Skin: Negative for itching and rash.  Neurological: Negative for dizziness, tingling, focal weakness, loss of consciousness, weakness and headaches.  Endo/Heme/Allergies: Negative for polydipsia. Does not bruise/bleed easily.  Psychiatric/Behavioral: Negative for depression, memory loss and substance abuse. The patient is not nervous/anxious.  Physical Examination    Vitals: BP 138/84  Pulse 64  Resp 12  Ht 154.9 cm (5\' 1" )  Wt (!) 115.8 kg (255 lb 3.2 oz)  BMI 48.22 kg/m  Ht:154.9 cm (5\' 1" ) Wt:(!) 115.8 kg (255 lb 3.2 oz) TML:YYTK surface area is 2.23 meters squared. Body mass index is 48.22 kg/m.  Wt Readings from Last 3 Encounters:  06/02/16 (!) 115.8 kg (255 lb 3.2 oz)  12/30/15 (!) 118.4 kg (261 lb)  12/16/15 (!) 117.9 kg (260 lb)   BP Readings from Last 3 Encounters:  06/02/16 138/84  12/30/15 (!) 152/97  12/28/15 (!) 161/95   General appearance appears in no acute distress  Head Mouth and Eye exam Normocephalic, without obvious abnormality, atraumatic Dentition is good Eyes appear anicteric   Neck exam Thyroid: normal  Nodes: no obvious adenopathy  LUNGS Breath Sounds: Normal Percussion: Normal  CARDIOVASCULAR JVP CV wave: no HJR: no Elevation at 90 degrees: None Carotid Pulse: normal pulsation bilaterally Bruit: None Apex: apical impulse normal  Auscultation Rhythm: normal sinus rhythm S1: normal S2: normal Clicks: no Rub: no Murmurs: no murmurs  Gallop: None ABDOMEN Liver enlargement: no Pulsatile aorta: no Ascites: no Bruits: no  EXTREMITIES Clubbing: no Edema: trace to 1+ bilateral pedal edema Pulses: peripheral pulses symmetrical Femoral Bruits: no Amputation: no SKIN Rash: no Cyanosis: no Embolic phemonenon: no Bruising: no NEURO Alert and Oriented to person, place and time: yes Non focal: yes  PSYCH: Pt appears to have normal affect  LABS REVIEWED Last 3 CBC results: Lab Results  Component Value Date  WBC 8.1 12/16/2015  WBC 11.9 (H) 09/10/2015  WBC 10.2 07/07/2015   Lab Results  Component Value Date  HGB 14.1 12/16/2015  HGB 14.5 09/10/2015  HGB 13.8 07/07/2015   Lab Results  Component Value Date  HCT 42.3 12/16/2015  HCT 43.8 09/10/2015  HCT 41.8 07/07/2015   Lab Results  Component Value Date  PLT 344 12/16/2015  PLT 345 09/10/2015  PLT 364 07/07/2015    Lab Results  Component Value Date  CREATININE 0.6 09/20/2015  BUN 12 09/20/2015  NA 143 09/20/2015  K 4.6 09/20/2015  CL 106 09/20/2015  CO2 31.0 09/20/2015   Lab Results  Component Value Date  HDL 39.4 09/20/2015  HDL 40.2 03/16/2015  HDL 39.9 09/15/2014   Lab Results  Component Value Date  LDLCALC 72 09/20/2015  LDLCALC 73 03/16/2015  LDLCALC 70 09/15/2014   Lab Results  Component Value Date  TRIG 108 09/20/2015  TRIG 112 03/16/2015  TRIG 60 09/15/2014   Lab Results  Component Value Date  ALT 12 09/20/2015  AST 8 09/20/2015  ALKPHOS 34 09/20/2015   Assessment and Plan   47 y.o. female with  ICD-10-CM ICD-9-CM  1. Essential hypertension, benign-blood pressure is fairly well controlled on current regimen will continue with carvedilol and follow. Dash diet is recommended I10 401.1  2. Obstructive sleep apnea (adult) (pediatric)-weight loss and CPAP use is recommend G47.33 327.23  3. Chest pain, atypical-chest pain with both typical atypical features. Cardiac CT angiogram revealed no evidence of coronary artery disease. R07.89 786.59   4. SVT-continue with metoprolol 25 mg twice daily and follow.  Return in about 1 year (around 06/02/2017).  These notes generated with voice recognition software. I apologize for typographical errors.  Sydnee Levans, MD    Electronically signed by Sydnee Levans, MD at 06/02/2016 1:38 PM EST     Plan of Treatment - documented as of this encounter  Upcoming Encounters Upcoming Encounters  Date Type Specialty Care Team Description  07/05/2017 Office Visit Cardiology Fath, Aloha Gell, MD  [address]  [place], [place] [address]  [phone]  [phone] (Fax)    07/05/2017 Initial consult General Surgery [address] [place], [place] [address] [place], [place] [address]  [phone]  [phone] (Fax)    09/24/2017 Ancillary Orders Lab  Barnabas Lister, MD  [address] [facility] and Internal Medicine  [place], [place] [address]  [phone]  [phone] (Fax)    10/01/2017 Office Visit Internal Medicine Baldemar Lenis, Jacqulyn Liner, MD  [phone] [facility] and Internal Medicine  [place], [place] [address]  [phone]  [phone] (Fax)     Goals - documented as of this encounter  Goal Patient Goal Type Associated Problems Recent Progress Patient-Stated? Author  Weight (lb) < 112.5 kg (248 lb)  Weight  256 lb (116.1 kg) (06/14/2017 3:00 PM EDT) Yes Babaoff, Jacqulyn Liner, MD   Visit Diagnoses - documented in this encounter  Diagnosis  Essential hypertension, benign - Primary   Supraventricular tachycardia (CMS-HCC)  Other specified cardiac dysrhythmias   Morbid obesity with BMI of 45.0-49.9 ([place])   Obstructive sleep apnea (adult) (pediatric)   Chest pain, atypical    Discontinued Medications - documented as of this encounter  Medication Sig Discontinue Reason Start Date End Date  polyethylene glycol (MIRALAX) powder  Indications: Bright red rectal bleeding, Abdominal pain, LLQ (left lower quadrant) As directed for colonic prep. Therapy completed 12/30/2015 06/02/2016  metoprolol tartrate (LOPRESSOR) 25 MG tablet  Take 1 tablet (25 mg total) by mouth 2 (two) times daily. Reorder 06/02/2016 06/02/2016   Images Document Information  Primary Care Provider Other Service Providers Document Coverage Dates  Jacqulyn Liner [facility] MD (Jun. 15, 2015 - Present) [phone] (Work) [phone] (Fax) [address] and Internal Medicine [place], [place] [address]   Mar. 02, 2018   [place] Organization  [facility] [address] [place], [place] [address]   Encounter Providers Encounter Date  Sydnee Levans MD (Attending) [phone] (Work) [phone] (Fax) [address] [place], [place] [address]  Mar. 02, 2018

## 2017-06-18 NOTE — Patient Instructions (Signed)
Your procedure is scheduled on: 06-21-17 Report to Same Day Surgery 2nd floor medical mall [facility] Entrance-take elevator on left to 2nd floor.  Check in with surgery information desk.) To find out your arrival time please call [phone] between 1PM - 3PM on 06-20-17  Remember: Instructions that are not followed completely may result in serious medical risk, up to and including death, or upon the discretion of your surgeon and anesthesiologist your surgery may need to be rescheduled.    _x___ 1. Do not eat food after midnight the night before your procedure. You may drink clear liquids up to 2 hours before you are scheduled to arrive at the hospital for your procedure.  Do not drink clear liquids within 2 hours of your scheduled arrival to the hospital.  Clear liquids include  --Water or Apple juice without pulp  --Clear carbohydrate beverage such as ClearFast or Gatorade  --Black Coffee or Clear Tea (No milk, no creamers, do not add anything to  the coffee or Tea   No gum chewing or hard candies.     __x__ 2. No Alcohol for 24 hours before or after surgery.   __x__3. No Smoking or e-cigarettes for 24 prior to surgery.  Do not use any chewable tobacco products for at least 6 hour prior to surgery   ____  4. Bring all medications with you on the day of surgery if instructed.    __x__ 5. Notify your doctor if there is any change in your medical condition     (cold, fever, infections).    x___6. On the morning of surgery brush your teeth with toothpaste and water.  You may rinse your mouth with mouth wash if you wish.  Do not swallow any toothpaste or mouthwash.   Do not wear jewelry, make-up, hairpins, clips or nail polish.  Do not wear lotions, powders, or perfumes. You may wear deodorant.  Do not shave 48 hours prior to surgery. Men may shave face and neck.  Do not bring valuables to the hospital.    [facility] is not responsible for any belongings or valuables.       Contacts, dentures or bridgework may not be worn into surgery.  Leave your suitcase in the car. After surgery it may be brought to your room.  For patients admitted to the hospital, discharge time is determined by your treatment team.  _  Patients discharged the day of surgery will not be allowed to drive home.  You will need someone to drive you home and stay with you the night of your procedure.    Please read over the following fact sheets that you were given:   [facility] [place] Preparing for Surgery and or MRSA Information   _x___ TAKE THE FOLLOWING MEDICATION THE MORNING OF SURGERY WITH A SMALL SIP OF WATER. These include:  1. METOPROLOL  2. PROTONIX  3. TAKE AN EXTRA PROTONIX THE NIGHT BEFORE SURGERY  4.  5.  6.  ____Fleets enema or Magnesium Citrate as directed.   ____ Use CHG Soap or sage wipes as directed on instruction sheet   ____ Use inhalers on the day of surgery and bring to hospital day of surgery  ____ Stop Metformin and Janumet 2 days prior to surgery.    ____ Take 1/2 of usual insulin dose the night before surgery and none on the morning surgery.   ____ Follow recommendations from Cardiologist, Pulmonologist or PCP regarding stopping Aspirin, Coumadin, Plavix ,Eliquis, Effient, or Pradaxa, and  Pletal.  X____Stop Anti-inflammatories such as Advil, Aleve, IBUPROFEN, Motrin, Naproxen, Naprosyn, Goodies powders or aspirin product NOW- OK to take Tylenol    ____ Stop supplements until after surgery.     _X___ Bring C-Pap to the hospital.

## 2017-06-20 MED ORDER — CEFAZOLIN SODIUM-DEXTROSE 2-4 GM/100ML-% IV SOLN
2.0000 g | INTRAVENOUS | Status: AC
  Administered 2017-06-21: 2 g via INTRAVENOUS

## 2017-06-20 NOTE — Pre-Procedure Instructions (Signed)
ECG 12-lead4/08/2015 Ashlee Everett Component Name Value Ref Range  Vent Rate (bpm) 68   PR Interval (msec) 174   QRS Interval (msec) 94   QT Interval (msec) 418   QTc (msec) 444   Other Result Information  This result has an attachment that is not available.  Result Narrative  Normal sinus rhythm with sinus arrhythmia Minimal voltage criteria for LVH, may be normal variant Borderline ECG When compared with ECG of 26-May-2014 17:44, No significant change was found I reviewed and concur with this report. Electronically signed YB:[email or web address], MD, Jeneen Rinks [phone]) on 10/26/2015 7:37:38 AM  Status Results Details   Encounter Summary

## 2017-06-20 NOTE — Pre-Procedure Instructions (Signed)
CT heart angio incl 3D img w func eval12/29/2016 [place] Result Narrative  CTA Heart with contrast without function.  Indication: R07.9 Chest pain, unspecified, chest pain with intermediate risk functional study  Comparison: None  Technique:Contrast enhanced Prospective ECG gated CTA of the heart was performed with a total of 75 mL Isovue 370 intravenous contrast at a rate of 6 mL/sec without adverse reaction. Serum creatinine was 0.6 mg/dL on 03/16/2015.If IV contrast material had not been administered, the likelihood of detecting abnormalities relevant to the patient's condition would have been substantially decreased.3-D volumetric and curved planar reformats were likewise performed as indicated to increase the sensitivity of detecting clinically relevant pathology.   Patient specific Information: Blood Pressure at closest time to scan: 128/73 Heart rate during exam: 66 Heart rhythm during exam: Normal sinus rhythm Height and Weight: 159 cm and 116.6 kg Beta-blockers administered: 100 mg metoprolol by mouth Nitroglycerin administered: 0.4 mg nitroglycerin sublingual  Limitations of study if any: None.  Findings: Coronary Arteries:  There is right dominance of the coronary arterial circulation.The coronary artery ostia are in normal location. Normal variant of conus artery arising from the right coronary cusp. Normal variant of ramus branch off left coronary artery with distal diagonal branch.  Left Main: Course: normal Size: normal Atherosclerosis: none Stenosis: none  Left Anterior Descending: Course: normal Size: normal Atherosclerosis: none Stenosis: none  Diagonal: Course: normal Size: normal Atherosclerosis: none Stenosis: none  Left Circumflex: Course: normal Size: normal Atherosclerosis: none Stenosis: none  Obtuse Marginals Course: normal Size: normal Atherosclerosis: none Stenosis: none  Right Coronary  Artery: Course: normal Size: normal Atherosclerosis: none Stenosis: none  Chamber Morphology and Function: Left Ventricle: Chamber size: normal Chamber morphology: No masses or thrombus; normal wall thickness.  Left Atrium: Chamber size: normal Chamber morphology: No masses or thrombus; normal wall thickness.  Right Ventricle: Chamber size: normal Chamber morphology: No masses or thrombus; normal wall thickness.  Right Atrium: Chamber size: normal Chamber morphology: No masses or thrombus; normal wall thickness.  Valves: Aortic Valve: Morphology and Function: Normal-appearing trileaflet. Mitral Valve: Morphology and Function: normal  CT chest: Visualized main pulmonary artery is normal in caliber and course. The descending thoracic aorta is torturous, crossing the midline; otherwise, the thoracic aorta with its branches is normal in caliber and courses. No lymphadenopathy in the mediastinum. Visualized lungs are normal without pulmonary nodules or masses. Main airways are patent. Visualized the pleural spaces are normal.  No sclerotic or suspicious osseous lesions.  Impression: 1. Origin and course of coronary arteries are normal. 2. No atherosclerotic disease and no luminal narrowing. CAD-RADS score 0.    CAD-RADS Reporting and Data System for patients presenting with stable chest pain (vessels greater than 1.5 mm diameter) Category Degree of maximal coronary stenosis   Interpretation CAD-RADS 0 0 (no plaque or stenosis)   No CAD present CAD-RADS 1 1-24% (minimal stenosis or plaque with no stenosis) Minimal non-obstructive CAD CAD-RADS 2 25-49% mild luminal stenosis  Mild non-obstructive CAD CAD-RADS 3 50-69% moderate luminal stenosis  Moderate Stenosis CAD-RADS  4A70-99% severe luminal stenosis  Severe Stenosis CAD-RADS 4Bleft main > 50% or 3 vessel obstructive (>70% disease Severe Stenosis CAD-RADS 5 100% (total occlusion)  Total coronary occlusion CAD-RADS N non-diagnostic  Obstructive CAD can not be excluded  Modifiers: N: non-diagnostic S: stent G: graft V: vulnerable  Electronically Reviewed LO:VFIEPP Tang, PhD, MD Electronically Reviewed on:03/31/2015 12:04 PM  I have reviewed the images and concur with the above findings.  Electronically Signed EH:UDJSH Lennox Laity, MD Electronically Signed on:04/01/2015 9:18 AM  Other Result Information  Interface, Rad Results In - 04/01/2015  9:21 AM EST CTA Heart with contrast without function.  Indication: R07.9 Chest pain, unspecified, chest pain with intermediate risk functional study  Comparison: None  Technique:  Contrast enhanced Prospective ECG gated CTA of the heart was performed with a total of 75 mL Isovue 370 intravenous contrast at a rate of 6 mL/sec without adverse reaction. Serum creatinine was 0.6 mg/dL on 03/16/2015.  If IV contrast material had not been administered, the likelihood of detecting abnormalities relevant to the patient's condition would have been substantially decreased.  3-D volumetric and curved planar reformats were likewise performed as indicated to increase the sensitivity of detecting clinically relevant pathology.   Patient specific Information: Blood Pressure at closest time to scan: 128/73 Heart rate during exam: 66 Heart rhythm during exam: Normal sinus rhythm Height and Weight: 159 cm and 116.6 kg Beta-blockers administered: 100 mg metoprolol by mouth Nitroglycerin administered: 0.4 mg nitroglycerin sublingual  Limitations of study if any: None.  Findings: Coronary  Arteries:  There is right dominance of the coronary arterial circulation.  The coronary artery ostia are in normal location. Normal variant of conus artery arising from the right coronary cusp. Normal variant of ramus branch off left coronary artery with distal diagonal branch.  Left Main: Course: normal Size: normal Atherosclerosis: none Stenosis: none  Left Anterior Descending: Course: normal Size: normal Atherosclerosis: none Stenosis: none  Diagonal: Course: normal Size: normal Atherosclerosis: none Stenosis: none  Left Circumflex: Course: normal Size: normal Atherosclerosis: none Stenosis: none  Obtuse Marginals Course: normal Size: normal Atherosclerosis: none Stenosis: none  Right Coronary Artery: Course: normal Size: normal Atherosclerosis: none Stenosis: none  Chamber Morphology and Function: Left Ventricle: Chamber size: normal Chamber morphology: No masses or thrombus; normal wall thickness.  Left Atrium: Chamber size: normal Chamber morphology: No masses or thrombus; normal wall thickness.  Right Ventricle: Chamber size: normal Chamber morphology: No masses or thrombus; normal wall thickness.  Right Atrium: Chamber size: normal Chamber morphology: No masses or thrombus; normal wall thickness.  Valves: Aortic Valve: Morphology and Function: Normal-appearing trileaflet. Mitral Valve: Morphology and Function: normal  CT chest: Visualized main pulmonary artery is normal in caliber and course. The descending thoracic aorta is torturous, crossing the midline; otherwise, the thoracic aorta with its branches is normal in caliber and courses. No lymphadenopathy in the mediastinum. Visualized lungs are normal without pulmonary nodules or masses. Main airways are patent. Visualized the pleural spaces are normal.  No sclerotic or suspicious osseous lesions.  Impression: 1. Origin and course of coronary arteries are normal. 2. No  atherosclerotic disease and no luminal narrowing. CAD-RADS score 0.    CAD-RADS Reporting and Data System for patients presenting with stable chest pain (vessels greater than 1.5 mm diameter) Category                 Degree of maximal coronary stenosis                       Interpretation CAD-RADS 0               0 (no plaque or stenosis)                                 No CAD present CAD-RADS 1  1-24% (minimal stenosis or plaque with no stenosis)       Minimal non-obstructive CAD CAD-RADS 2               25-49% mild luminal stenosis                              Mild non-obstructive CAD CAD-RADS 3               50-69% moderate luminal stenosis                          Moderate Stenosis CAD-RADS 4A              70-99% severe luminal stenosis                            Severe Stenosis CAD-RADS 4B              left main > 50% or 3 vessel obstructive (>70% disease     Severe Stenosis CAD-RADS 5               100% (total occlusion)                                    Total coronary occlusion CAD-RADS N               non-diagnostic                                            Obstructive CAD can not be excluded  Modifiers: N: non-diagnostic S: stent G: graft V: vulnerable  Electronically Reviewed by:  Gary Fleet, PhD, MD Electronically Reviewed on:  03/31/2015 12:04 PM  I have reviewed the images and concur with the above findings.  Electronically Signed by:  Waynard Edwards, MD Electronically Signed on:  04/01/2015 9:18 AM  Status Results Details   Encounter Summary

## 2017-06-21 ENCOUNTER — Encounter
Admission: RE | Disposition: A | Discharge status: Home / Self Care | Payer: Self-pay | Source: Ambulatory Visit | Attending: General Surgery

## 2017-06-21 ENCOUNTER — Encounter: Payer: Self-pay | Admitting: *Deleted

## 2017-06-21 ENCOUNTER — Ambulatory Visit: Payer: PRIVATE HEALTH INSURANCE | Admitting: Anesthesiology

## 2017-06-21 ENCOUNTER — Other Ambulatory Visit: Payer: Self-pay

## 2017-06-21 ENCOUNTER — Ambulatory Visit
Admission: RE | Admit: 2017-06-21 | Discharge: 2017-06-21 | Disposition: A | Discharge status: Home / Self Care | Payer: PRIVATE HEALTH INSURANCE | Source: Ambulatory Visit | Attending: General Surgery | Admitting: General Surgery

## 2017-06-21 DIAGNOSIS — I471 Supraventricular tachycardia: Secondary | ICD-10-CM | POA: Insufficient documentation

## 2017-06-21 DIAGNOSIS — Z79899 Other long term (current) drug therapy: Secondary | ICD-10-CM | POA: Insufficient documentation

## 2017-06-21 DIAGNOSIS — Z888 Allergy status to other drugs, medicaments and biological substances status: Secondary | ICD-10-CM | POA: Insufficient documentation

## 2017-06-21 DIAGNOSIS — K811 Chronic cholecystitis: Secondary | ICD-10-CM | POA: Insufficient documentation

## 2017-06-21 DIAGNOSIS — Z6841 Body Mass Index (BMI) 40.0 and over, adult: Secondary | ICD-10-CM | POA: Diagnosis not present

## 2017-06-21 DIAGNOSIS — K828 Other specified diseases of gallbladder: Secondary | ICD-10-CM | POA: Insufficient documentation

## 2017-06-21 DIAGNOSIS — K589 Irritable bowel syndrome without diarrhea: Secondary | ICD-10-CM | POA: Diagnosis not present

## 2017-06-21 DIAGNOSIS — I10 Essential (primary) hypertension: Secondary | ICD-10-CM | POA: Diagnosis not present

## 2017-06-21 DIAGNOSIS — Z9989 Dependence on other enabling machines and devices: Secondary | ICD-10-CM | POA: Diagnosis not present

## 2017-06-21 DIAGNOSIS — G4733 Obstructive sleep apnea (adult) (pediatric): Secondary | ICD-10-CM | POA: Diagnosis not present

## 2017-06-21 DIAGNOSIS — Z8249 Family history of ischemic heart disease and other diseases of the circulatory system: Secondary | ICD-10-CM | POA: Diagnosis not present

## 2017-06-21 DIAGNOSIS — Z8719 Personal history of other diseases of the digestive system: Secondary | ICD-10-CM | POA: Insufficient documentation

## 2017-06-21 HISTORY — PX: CHOLECYSTECTOMY: SHX55

## 2017-06-21 SURGERY — LAPAROSCOPIC CHOLECYSTECTOMY
Anesthesia: General

## 2017-06-21 MED ORDER — TRAMADOL HCL 50 MG PO TABS
50.0000 mg | ORAL_TABLET | Freq: Once | ORAL | Status: AC
  Administered 2017-06-21: 50 mg via ORAL

## 2017-06-21 MED ORDER — LIDOCAINE HCL (PF) 2 % IJ SOLN
INTRAMUSCULAR | Status: AC
  Filled 2017-06-21: qty 10

## 2017-06-21 MED ORDER — KETOROLAC TROMETHAMINE 30 MG/ML IJ SOLN
INTRAMUSCULAR | Status: AC
  Filled 2017-06-21: qty 1

## 2017-06-21 MED ORDER — MIDAZOLAM HCL 2 MG/2ML IJ SOLN
INTRAMUSCULAR | Status: AC
  Filled 2017-06-21: qty 2

## 2017-06-21 MED ORDER — ACETAMINOPHEN 10 MG/ML IV SOLN
INTRAVENOUS | Status: DC | PRN
  Administered 2017-06-21: 1000 mg via INTRAVENOUS

## 2017-06-21 MED ORDER — KETAMINE HCL 50 MG/ML IJ SOLN
INTRAMUSCULAR | Status: DC | PRN
  Administered 2017-06-21 (×2): 20 mg via INTRAMUSCULAR

## 2017-06-21 MED ORDER — KETOROLAC TROMETHAMINE 30 MG/ML IJ SOLN
INTRAMUSCULAR | Status: DC | PRN
  Administered 2017-06-21: 30 mg via INTRAVENOUS

## 2017-06-21 MED ORDER — SUGAMMADEX SODIUM 500 MG/5ML IV SOLN
INTRAVENOUS | Status: DC | PRN
  Administered 2017-06-21: 250 mg via INTRAVENOUS

## 2017-06-21 MED ORDER — ONDANSETRON HCL 4 MG/2ML IJ SOLN
INTRAMUSCULAR | Status: AC
  Filled 2017-06-21: qty 2

## 2017-06-21 MED ORDER — FENTANYL CITRATE (PF) 100 MCG/2ML IJ SOLN: 25.0000 ug | INTRAMUSCULAR | Status: DC | PRN

## 2017-06-21 MED ORDER — ONDANSETRON HCL 4 MG/2ML IJ SOLN: 4.0000 mg | Freq: Once | INTRAMUSCULAR | Status: DC | PRN

## 2017-06-21 MED ORDER — GLYCOPYRROLATE 0.2 MG/ML IJ SOLN
INTRAMUSCULAR | Status: DC | PRN
  Administered 2017-06-21 (×2): 0.1 mg via INTRAVENOUS

## 2017-06-21 MED ORDER — ROCURONIUM BROMIDE 100 MG/10ML IV SOLN
INTRAVENOUS | Status: DC | PRN
  Administered 2017-06-21: 30 mg via INTRAVENOUS
  Administered 2017-06-21 (×3): 10 mg via INTRAVENOUS

## 2017-06-21 MED ORDER — BUPIVACAINE-EPINEPHRINE (PF) 0.5% -1:200000 IJ SOLN
INTRAMUSCULAR | Status: AC
  Filled 2017-06-21: qty 30

## 2017-06-21 MED ORDER — ONDANSETRON HCL 4 MG/2ML IJ SOLN
INTRAMUSCULAR | Status: DC | PRN
Start: 2017-06-21 — End: 2017-06-21
  Administered 2017-06-21: 4 mg via INTRAVENOUS

## 2017-06-21 MED ORDER — PHENYLEPHRINE HCL 10 MG/ML IJ SOLN
INTRAMUSCULAR | Status: DC | PRN
  Administered 2017-06-21: 150 ug via INTRAVENOUS
  Administered 2017-06-21 (×4): 100 ug via INTRAVENOUS
  Administered 2017-06-21: 150 ug via INTRAVENOUS

## 2017-06-21 MED ORDER — PROPOFOL 10 MG/ML IV BOLUS
INTRAVENOUS | Status: AC
  Filled 2017-06-21: qty 40

## 2017-06-21 MED ORDER — TRAMADOL HCL 50 MG PO TABS: 50.0000 mg | ORAL_TABLET | Freq: Four times a day (QID) | ORAL | 0 refills | Status: AC | PRN

## 2017-06-21 MED ORDER — FENTANYL CITRATE (PF) 100 MCG/2ML IJ SOLN
INTRAMUSCULAR | Status: AC
  Filled 2017-06-21: qty 2

## 2017-06-21 MED ORDER — BUPIVACAINE-EPINEPHRINE 0.5% -1:200000 IJ SOLN
INTRAMUSCULAR | Status: DC | PRN
  Administered 2017-06-21: 10 mL

## 2017-06-21 MED ORDER — TRAMADOL HCL 50 MG PO TABS
ORAL_TABLET | ORAL | Status: AC
  Administered 2017-06-21: 50 mg via ORAL
  Filled 2017-06-21: qty 1

## 2017-06-21 MED ORDER — LIDOCAINE HCL (CARDIAC) 20 MG/ML IV SOLN
INTRAVENOUS | Status: DC | PRN
  Administered 2017-06-21: 100 mg via INTRAVENOUS

## 2017-06-21 MED ORDER — PHENYLEPHRINE HCL 10 MG/ML IJ SOLN
INTRAMUSCULAR | Status: AC
  Filled 2017-06-21: qty 1

## 2017-06-21 MED ORDER — SUCCINYLCHOLINE CHLORIDE 20 MG/ML IJ SOLN
INTRAMUSCULAR | Status: AC
  Filled 2017-06-21: qty 1

## 2017-06-21 MED ORDER — FENTANYL CITRATE (PF) 100 MCG/2ML IJ SOLN
INTRAMUSCULAR | Status: DC | PRN
  Administered 2017-06-21: 100 ug via INTRAVENOUS

## 2017-06-21 MED ORDER — DEXAMETHASONE SODIUM PHOSPHATE 10 MG/ML IJ SOLN
INTRAMUSCULAR | Status: DC | PRN
  Administered 2017-06-21: 5 mg via INTRAVENOUS

## 2017-06-21 MED ORDER — MIDAZOLAM HCL 2 MG/2ML IJ SOLN
INTRAMUSCULAR | Status: DC | PRN
  Administered 2017-06-21: 2 mg via INTRAVENOUS

## 2017-06-21 MED ORDER — ROCURONIUM BROMIDE 50 MG/5ML IV SOLN
INTRAVENOUS | Status: AC
  Filled 2017-06-21: qty 1

## 2017-06-21 MED ORDER — ACETAMINOPHEN 10 MG/ML IV SOLN
INTRAVENOUS | Status: AC
  Filled 2017-06-21: qty 100

## 2017-06-21 MED ORDER — KETAMINE HCL 50 MG/ML IJ SOLN
INTRAMUSCULAR | Status: AC
  Filled 2017-06-21: qty 10

## 2017-06-21 MED ORDER — CEFAZOLIN SODIUM-DEXTROSE 2-4 GM/100ML-% IV SOLN
INTRAVENOUS | Status: AC
  Filled 2017-06-21: qty 100

## 2017-06-21 MED ORDER — DEXAMETHASONE SODIUM PHOSPHATE 10 MG/ML IJ SOLN
INTRAMUSCULAR | Status: AC
  Filled 2017-06-21: qty 1

## 2017-06-21 MED ORDER — LACTATED RINGERS IV SOLN
INTRAVENOUS | Status: DC
  Administered 2017-06-21: 07:00:00 via INTRAVENOUS

## 2017-06-21 MED ORDER — PROPOFOL 10 MG/ML IV BOLUS
INTRAVENOUS | Status: DC | PRN
  Administered 2017-06-21: 150 mg via INTRAVENOUS

## 2017-06-21 SURGICAL SUPPLY — 35 items
APPLIER CLIP LOGIC TI 5 (MISCELLANEOUS) ×3 IMPLANT
BLADE SURG SZ11 CARB STEEL (BLADE) ×3 IMPLANT
CANISTER SUCT 1200ML W/VALVE (MISCELLANEOUS) ×3 IMPLANT
CHLORAPREP W/TINT 26ML (MISCELLANEOUS) ×3 IMPLANT
DERMABOND ADVANCED (GAUZE/BANDAGES/DRESSINGS) ×2
DERMABOND ADVANCED .7 DNX12 (GAUZE/BANDAGES/DRESSINGS) ×1 IMPLANT
DRAPE SHEET LG 3/4 BI-LAMINATE (DRAPES) ×3 IMPLANT
ELECT REM PT RETURN 9FT ADLT (ELECTROSURGICAL) ×3
ELECTRODE REM PT RTRN 9FT ADLT (ELECTROSURGICAL) ×1 IMPLANT
GLOVE BIO SURGEON STRL SZ 6.5 (GLOVE) ×2 IMPLANT
GLOVE BIO SURGEONS STRL SZ 6.5 (GLOVE) ×1
GOWN STRL REUS W/ TWL LRG LVL3 (GOWN DISPOSABLE) ×4 IMPLANT
GOWN STRL REUS W/TWL LRG LVL3 (GOWN DISPOSABLE) ×8
GRASPER SUT TROCAR 14GX15 (MISCELLANEOUS) IMPLANT
HEMOSTAT SURGICEL 2X3 (HEMOSTASIS) IMPLANT
IRRIGATION STRYKERFLOW (MISCELLANEOUS) ×1 IMPLANT
IRRIGATOR STRYKERFLOW (MISCELLANEOUS) ×3
IV NS 1000ML (IV SOLUTION) ×2
IV NS 1000ML BAXH (IV SOLUTION) ×1 IMPLANT
KIT TURNOVER KIT A (KITS) ×3 IMPLANT
LABEL OR SOLS (LABEL) IMPLANT
NEEDLE HYPO 25X1 1.5 SAFETY (NEEDLE) ×3 IMPLANT
NEEDLE INSUFFLATION 14GA 120MM (NEEDLE) ×3 IMPLANT
NS IRRIG 500ML POUR BTL (IV SOLUTION) ×3 IMPLANT
PACK LAP CHOLECYSTECTOMY (MISCELLANEOUS) ×3 IMPLANT
PENCIL ELECTRO HAND CTR (MISCELLANEOUS) IMPLANT
POUCH SPECIMEN RETRIEVAL 10MM (ENDOMECHANICALS) ×3 IMPLANT
SCISSORS METZENBAUM CVD 33 (INSTRUMENTS) IMPLANT
SLEEVE ENDOPATH XCEL 5M (ENDOMECHANICALS) ×6 IMPLANT
SUT MNCRL AB 4-0 PS2 18 (SUTURE) ×3 IMPLANT
SUT VIC AB 0 CT1 36 (SUTURE) IMPLANT
SUT VICRYL 0 AB UR-6 (SUTURE) ×3 IMPLANT
TROCAR XCEL NON-BLD 11X100MML (ENDOMECHANICALS) ×3 IMPLANT
TROCAR XCEL NON-BLD 5MMX100MML (ENDOMECHANICALS) ×3 IMPLANT
TUBING INSUFFLATION (TUBING) ×3 IMPLANT

## 2017-06-21 NOTE — Discharge Instructions (Signed)
AMBULATORY SURGERY  DISCHARGE INSTRUCTIONS   1) The drugs that you were given will stay in your system until tomorrow so for the next 24 hours you should not:  A) Drive an automobile B) Make any legal decisions C) Drink any alcoholic beverage   2) You may resume regular meals tomorrow.  Today it is better to start with liquids and gradually work up to solid foods.  You may eat anything you prefer, but it is better to start with liquids, then soup and crackers, and gradually work up to solid foods.   3) Please notify your doctor immediately if you have any unusual bleeding, trouble breathing, redness and pain at the surgery site, drainage, fever, or pain not relieved by medication.    4) Additional Instructions:        Please contact your physician with any problems or Same Day Surgery at [phone], Monday through Friday 6 am to 4 pm, or [place] [facility] number at [phone]. Diet: Resume home heart healthy regular diet.   Activity: No heavy lifting >20 pounds (children, pets, laundry, garbage) or strenuous activity until follow-up, but light activity and walking are encouraged. Do not drive or drink alcohol if taking narcotic pain medications.  Wound care: May shower with soapy water and pat dry (do not rub incisions), but no baths or submerging incision underwater until follow-up. (no swimming)   Medications: Resume all home medications. For mild to moderate pain: acetaminophen (Tylenol) or ibuprofen (if no kidney disease). Combining Tylenol with alcohol can substantially increase your risk of causing liver disease. Narcotic pain medications, if prescribed, can be used for severe pain, though may cause nausea, constipation, and drowsiness. If you do not need the narcotic pain medication, you do not need to fill the prescription.  Call office ([phone]) at any time if any questions, worsening pain, fevers/chills, bleeding, drainage from incision site, or  other concerns.  

## 2017-06-21 NOTE — Interval H&P Note (Signed)
History and Physical Interval Note:  06/21/2017 6:53 AM  Ashlee Everett  has presented today for surgery, with the diagnosis of BILIARY DYSKINESIA  The various methods of treatment have been discussed with the patient and family. After consideration of risks, benefits and other options for treatment, the patient has consented to  Procedure(s): LAPAROSCOPIC CHOLECYSTECTOMY (N/A) as a surgical intervention .  The patient's history has been reviewed, patient examined, no change in status, stable for surgery.  I have reviewed the patient's chart and labs.  Questions were answered to the patient's satisfaction.     Herbert Pun

## 2017-06-21 NOTE — Op Note (Signed)
Preoperative diagnosis: Symptomatic biliary dyskinesia.  Postoperative diagnosis: Symptomatic biliary dyskinesia.  Procedure: Laparoscopic Cholecystectomy.   Anesthesia: GETA   Surgeon: Dr. Windell Moment  Wound Classification: Clean Contaminated  Indications: Patient is a 47 y.o. female developed right upper quadrant and epigastric pain pain associated with nausea and vomiting episodes without fever or leukocytosis and on workup was found to have no gallstone on ultrasound but a HIDA scan with 15% gallbladder ejection fraction. Laparoscopic cholecystectomy was elected.  Findings: Critical view of safety achieved Cystic duct and artery identified, ligated and divided Adequate hemostasis  Description of procedure: The patient was placed on the operating table in the supine position. General anesthesia was induced. A time-out was completed verifying correct patient, procedure, site, positioning, and implant(s) and/or special equipment prior to beginning this procedure. An orogastric tube was placed. The abdomen was prepped and draped in the usual sterile fashion.  An incision was made in a natural skin line below the umbilicus.  The fascia was elevated and the Veress needle inserted. Proper position was confirmed by aspiration and saline meniscus test.  The abdomen was insufflated with carbon dioxide to a pressure of 15 mmHg. The patient tolerated insufflation well. A 11-mm trocar was then inserted.  The laparoscope was inserted and the abdomen inspected. No injuries from initial trocar placement were noted. Additional trocars were then inserted in the following locations: a 5-mm trocar in the right epigastrium and two 5-mm trocars along the right costal margin. The abdomen was inspected and no abnormalities were found. The table was placed in the reverse Trendelenburg position with the right side up.  Filmy adhesions between the gallbladder and omentum, duodenum and transverse colon were lysed  sharply. The dome of the gallbladder was grasped with an atraumatic grasper passed through the lateral port and retracted over the dome of the liver. The infundibulum was also grasped with an atraumatic grasper through the midclavicular port and retracted toward the right lower quadrant. This maneuver exposed Calot's triangle. The peritoneum overlying the gallbladder infundibulum was then incised and the cystic duct and cystic artery identified and circumferentially dissected. A time out was performed to review the important structure and confirming the critical view of safety. The cystic duct and cystic artery were then doubly clipped and divided close to the gallbladder.  The gallbladder was then dissected from its peritoneal attachments by electrocautery. Hemostasis was checked and the gallbladder and contained stones were removed using an endoscopic retrieval bag placed through the umbilical port. The gallbladder was passed off the table as a specimen. The gallbladder fossa was copiously irrigated with saline and hemostasis was obtained. There was no evidence of bleeding from the gallbladder fossa or cystic artery or leakage of the bile from the cystic duct stump. Secondary trocars were removed under direct vision. No bleeding was noted. The laparoscope was withdrawn and the umbilical trocar removed. The abdomen was allowed to collapse. The fascia of the 108mm trocar sites was closed with figure-of-eight 0 vicryl sutures. The skin was closed with subcuticular sutures of 4-0 monocryl and topical skin adhesive. The orogastric tube was removed.  The patient tolerated the procedure well and was taken to the postanesthesia care unit in stable condition.   Specimen: Gallbladder  Complications: None  EBL: 74mL

## 2017-06-21 NOTE — Anesthesia Preprocedure Evaluation (Addendum)
Anesthesia Evaluation  Patient identified by MRN, date of birth, ID band Patient awake    Reviewed: Allergy & Precautions, NPO status , Patient's Chart, lab work & pertinent test results  History of Anesthesia Complications Negative for: history of anesthetic complications  Airway Mallampati: II       Dental   Pulmonary sleep apnea , neg COPD,           Cardiovascular hypertension, Pt. on medications and Pt. on home beta blockers (-) Past MI and (-) CHF + dysrhythmias Supra Ventricular Tachycardia (-) Valvular Problems/Murmurs     Neuro/Psych neg Seizures    GI/Hepatic Neg liver ROS, hiatal hernia, GERD  Medicated and Controlled,  Endo/Other  neg diabetesMorbid obesity  Renal/GU negative Renal ROS     Musculoskeletal   Abdominal   Peds  Hematology   Anesthesia Other Findings   Reproductive/Obstetrics                            Anesthesia Physical Anesthesia Plan  ASA: II  Anesthesia Plan: General   Post-op Pain Management:    Induction:   PONV Risk Score and Plan: 3 and Dexamethasone, Ondansetron and Midazolam  Airway Management Planned: Oral ETT  Additional Equipment:   Intra-op Plan:   Post-operative Plan:   Informed Consent: I have reviewed the patients History and Physical, chart, labs and discussed the procedure including the risks, benefits and alternatives for the proposed anesthesia with the patient or authorized representative who has indicated his/her understanding and acceptance.     Plan Discussed with:   Anesthesia Plan Comments:         Anesthesia Quick Evaluation

## 2017-06-21 NOTE — Anesthesia Postprocedure Evaluation (Signed)
Anesthesia Post Note  Patient: Ashlee Everett  Procedure(s) Performed: LAPAROSCOPIC CHOLECYSTECTOMY (N/A )  Patient location during evaluation: PACU Anesthesia Type: General Level of consciousness: awake and alert Pain management: pain level controlled Vital Signs Assessment: post-procedure vital signs reviewed and stable Respiratory status: spontaneous breathing and respiratory function stable Cardiovascular status: stable Anesthetic complications: no     Last Vitals:  Vitals Value Taken Time  BP 130/75 06/21/2017  9:47 AM  Temp 36.7 C 06/21/2017  9:47 AM  Pulse 70 06/21/2017  9:50 AM  Resp 20 06/21/2017  9:50 AM  SpO2 97 % 06/21/2017  9:50 AM  Vitals shown include unvalidated device data.  Last Pain:  Vitals:   06/21/17 0947  PainSc: 4                  KEPHART,WILLIAM K

## 2017-06-21 NOTE — Anesthesia Post-op Follow-up Note (Signed)
Anesthesia QCDR form completed.        

## 2017-06-21 NOTE — Transfer of Care (Signed)
Immediate Anesthesia Transfer of Care Note  Patient: Ashlee Everett  Procedure(s) Performed: LAPAROSCOPIC CHOLECYSTECTOMY (N/A )  Patient Location: PACU  Anesthesia Type:General  Level of Consciousness: awake and alert   Airway & Oxygen Therapy: Patient Spontanous Breathing and Patient connected to face mask oxygen  Post-op Assessment: Report given to RN and Post -op Vital signs reviewed and stable  Post vital signs: Reviewed and stable  Last Vitals:  Vitals Value Taken Time  BP 122/59 06/21/2017  9:08 AM  Temp 36.2 C 06/21/2017  9:08 AM  Pulse 82 06/21/2017  9:10 AM  Resp 17 06/21/2017  9:09 AM  SpO2 99 % 06/21/2017  9:10 AM  Vitals shown include unvalidated device data.  Last Pain:  Vitals:   06/21/17 0617  PainSc: 0-No pain         Complications: No apparent anesthesia complications

## 2017-06-21 NOTE — Anesthesia Procedure Notes (Signed)
Procedure Name: Intubation Date/Time: 06/21/2017 7:42 AM Performed by: Gunnar Fusi, MD Pre-anesthesia Checklist: Patient identified, Emergency Drugs available, Suction available, Timeout performed and Patient being monitored Patient Re-evaluated:Patient Re-evaluated prior to induction Oxygen Delivery Method: Circle system utilized Preoxygenation: Pre-oxygenation with 100% oxygen Induction Type: IV induction Ventilation: Mask ventilation without difficulty Laryngoscope Size: Mac and 3 Grade View: Grade I Tube type: Oral Tube size: 7.0 mm Number of attempts: 1 Airway Equipment and Method: Patient positioned with wedge pillow Placement Confirmation: ETT inserted through vocal cords under direct vision,  positive ETCO2 and breath sounds checked- equal and bilateral Secured at: 22 cm Tube secured with: Tape Dental Injury: Teeth and Oropharynx as per pre-operative assessment

## 2017-06-22 LAB — SURGICAL PATHOLOGY

## 2017-06-22 NOTE — Addendum Note (Signed)
Addendum  created 06/22/17 1350 by Jonna Clark, CRNA   Cosign clinical note, Sign clinical note

## 2017-06-22 NOTE — Anesthesia Post-op Follow-up Note (Signed)
Anesthesia QCDR form completed.        

## 2017-06-25 NOTE — Addendum Note (Signed)
Addendum  created 06/25/17 1243 by Jonna Clark, CRNA   Cosign clinical note

## 2019-03-09 ENCOUNTER — Emergency Department: Payer: PRIVATE HEALTH INSURANCE

## 2019-03-09 ENCOUNTER — Other Ambulatory Visit: Payer: Self-pay

## 2019-03-09 ENCOUNTER — Encounter: Payer: Self-pay | Admitting: Intensive Care

## 2019-03-09 ENCOUNTER — Emergency Department
Admission: EM | Admit: 2019-03-09 | Discharge: 2019-03-09 | Disposition: A | Discharge status: Home / Self Care | Payer: PRIVATE HEALTH INSURANCE | Attending: Student in an Organized Health Care Education/Training Program | Admitting: Student in an Organized Health Care Education/Training Program

## 2019-03-09 DIAGNOSIS — Z20828 Contact with and (suspected) exposure to other viral communicable diseases: Secondary | ICD-10-CM | POA: Insufficient documentation

## 2019-03-09 DIAGNOSIS — R509 Fever, unspecified: Secondary | ICD-10-CM | POA: Diagnosis not present

## 2019-03-09 DIAGNOSIS — Z79899 Other long term (current) drug therapy: Secondary | ICD-10-CM | POA: Diagnosis not present

## 2019-03-09 DIAGNOSIS — R079 Chest pain, unspecified: Secondary | ICD-10-CM | POA: Diagnosis present

## 2019-03-09 DIAGNOSIS — R42 Dizziness and giddiness: Secondary | ICD-10-CM | POA: Diagnosis not present

## 2019-03-09 DIAGNOSIS — I10 Essential (primary) hypertension: Secondary | ICD-10-CM | POA: Insufficient documentation

## 2019-03-09 DIAGNOSIS — R29810 Facial weakness: Secondary | ICD-10-CM | POA: Insufficient documentation

## 2019-03-09 LAB — BASIC METABOLIC PANEL
Anion gap: 6 (ref 5–15)
BUN: 11 mg/dL (ref 6–20)
CO2: 28 mmol/L (ref 22–32)
Calcium: 8.9 mg/dL (ref 8.9–10.3)
Chloride: 106 mmol/L (ref 98–111)
Creatinine, Ser: 0.54 mg/dL (ref 0.44–1.00)
GFR calc Af Amer: >60 mL/min (ref >60)
GFR calc non Af Amer: >60 mL/min (ref >60)
Glucose, Bld: 148 mg/dL — ABNORMAL HIGH (ref 70–99)
Potassium: 3.5 mmol/L (ref 3.5–5.1)
Sodium: 140 mmol/L (ref 135–145)

## 2019-03-09 LAB — CBC
HCT: 42.4 % (ref 36.0–46.0)
Hemoglobin: 13.9 g/dL (ref 12.0–15.0)
MCH: 29.6 pg (ref 26.0–34.0)
MCHC: 32.8 g/dL (ref 30.0–36.0)
MCV: 90.4 fL (ref 80.0–100.0)
Platelets: 330 10*3/uL (ref 150–400)
RBC: 4.69 MIL/uL (ref 3.87–5.11)
RDW: 13.2 % (ref 11.5–15.5)
WBC: 8.5 10*3/uL (ref 4.0–10.5)
nRBC: 0 % (ref 0.0–0.2)

## 2019-03-09 LAB — TROPONIN I (HIGH SENSITIVITY)
Troponin I (High Sensitivity): 2 ng/L (ref <18)
Troponin I (High Sensitivity): 3 ng/L (ref <18)

## 2019-03-09 IMAGING — CT CT HEAD W/O CM
2 series · 15 of 37 positions shown, 18 images · non-contrast
Comparison: None.

CLINICAL DATA: Evaluate for intracranial hemorrhage. Hypertension.
Tightness in chest.

EXAM:
CT HEAD WITHOUT CONTRAST
TECHNIQUE: Contiguous axial images were obtained from the base of the skull
through the vertex without intravenous contrast.

[Series 3: head wo · axial · 0.42mm/px · z∈[-94,+46]mm · 12 of 34 slices shown, 15 images]
[im 3/34  brain]
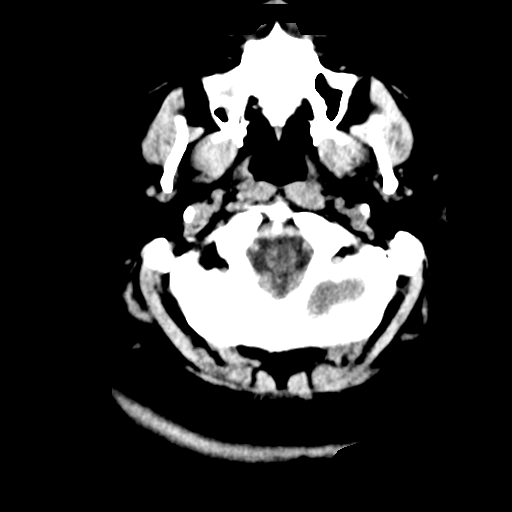
[im 3/34  bone]
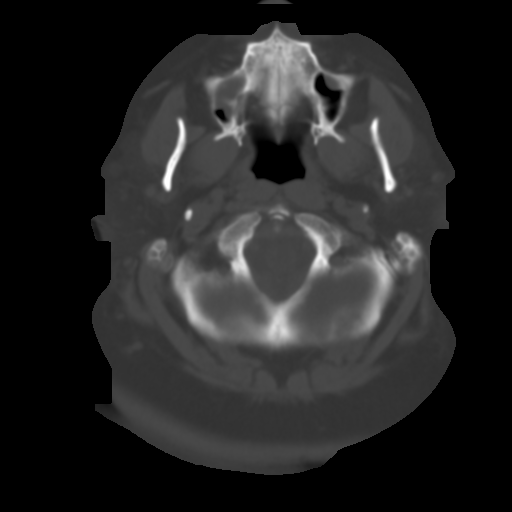
[im 5/34  brain]
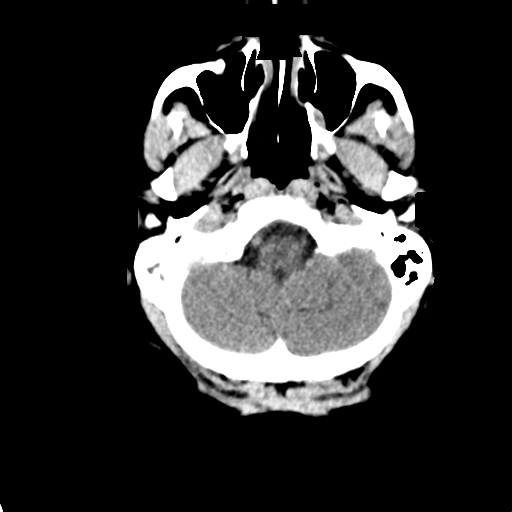
[im 7/34  brain]
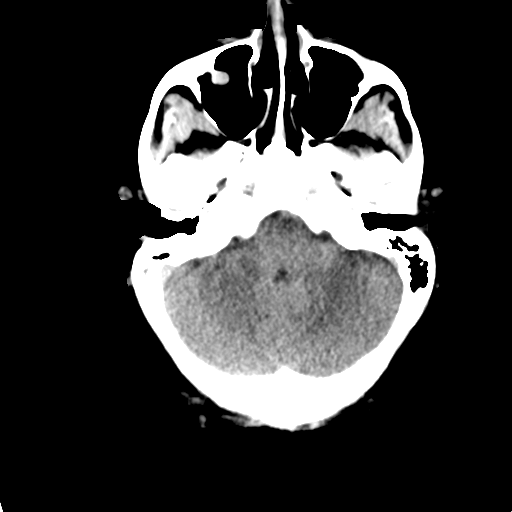
[im 11/34  brain]
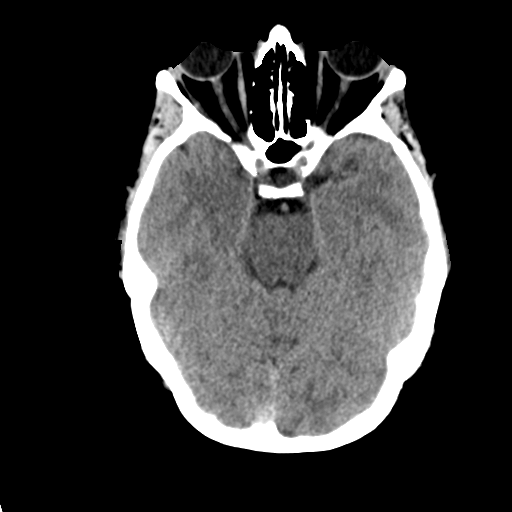
[im 13/34  brain]
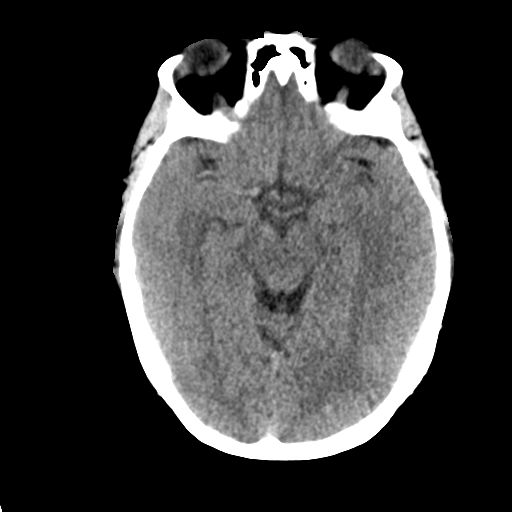
[im 13/34  bone]
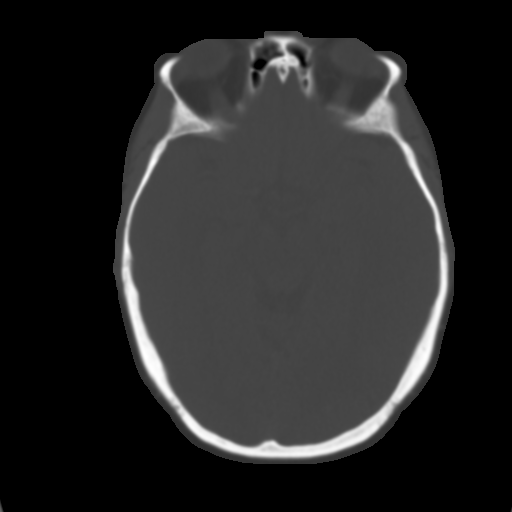
[im 15/34  brain]
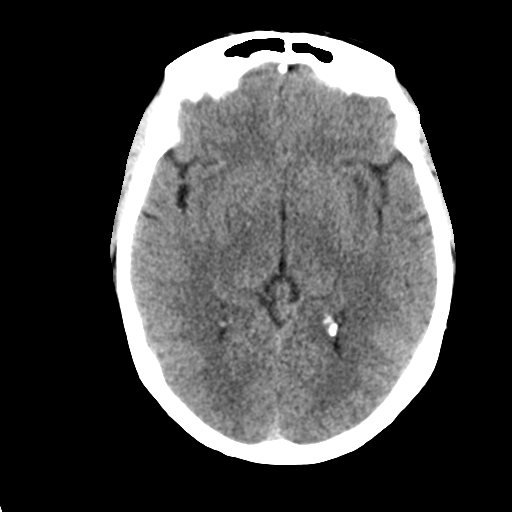
[im 19/34  brain]
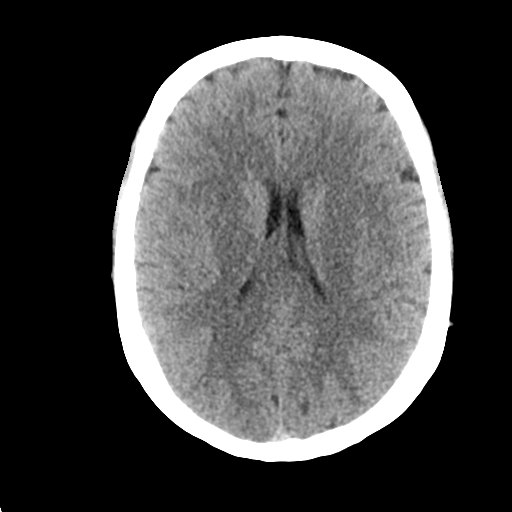
[im 21/34  brain]
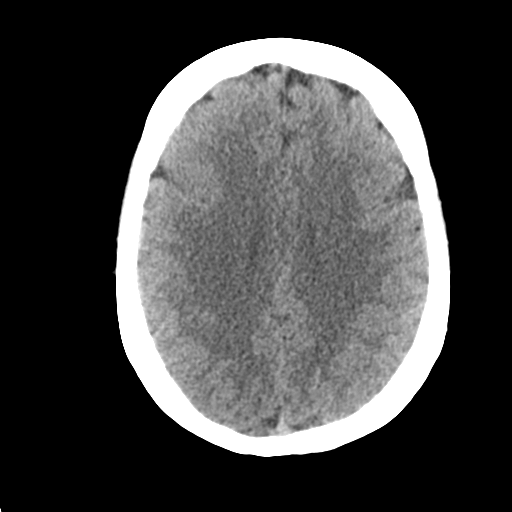
[im 23/34  brain]
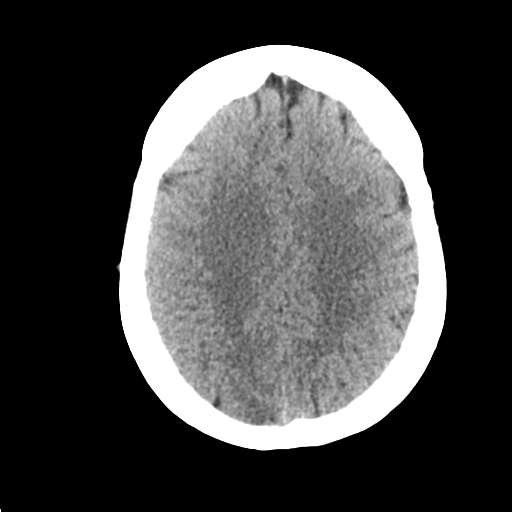
[im 23/34  bone]
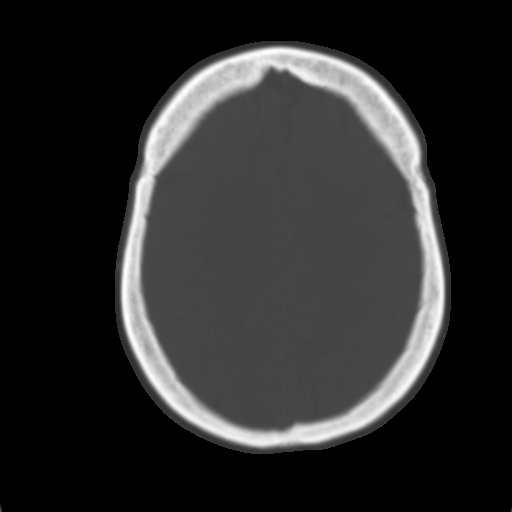
[im 27/34  brain]
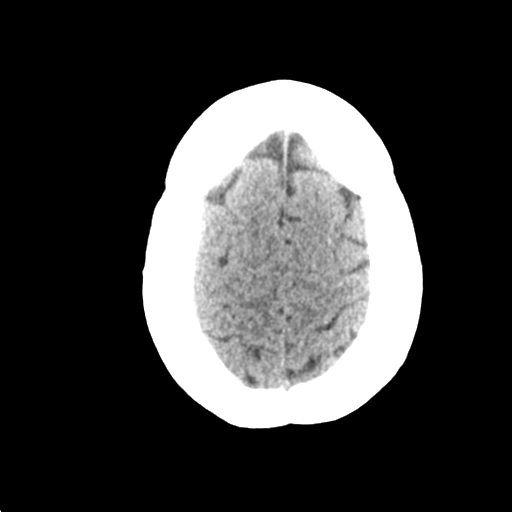
[im 29/34  brain]
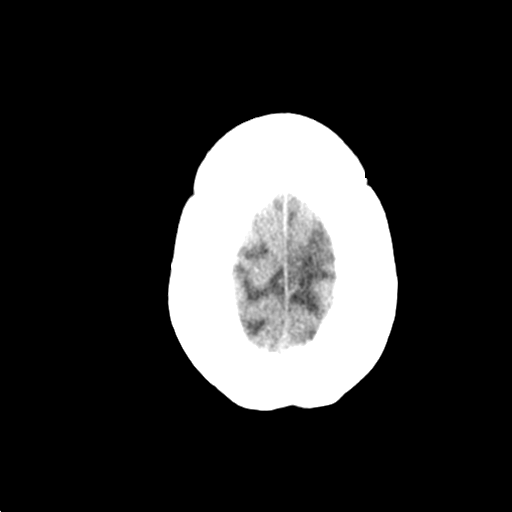
[im 31/34  brain]
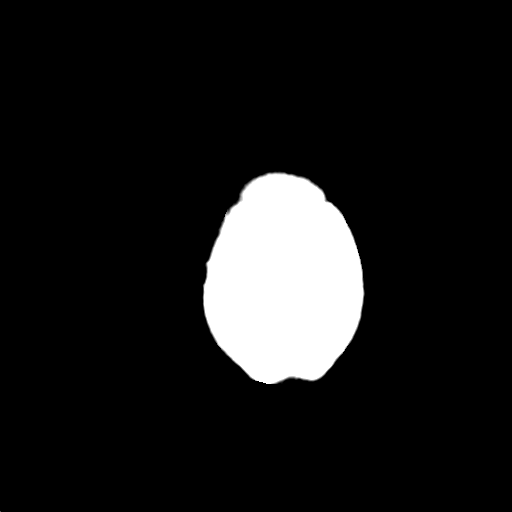

[Series 5: sagittal soft tissue · sagittal · 0.32mm/px · 3 of 52 slices shown]
[im 18/52  brain]
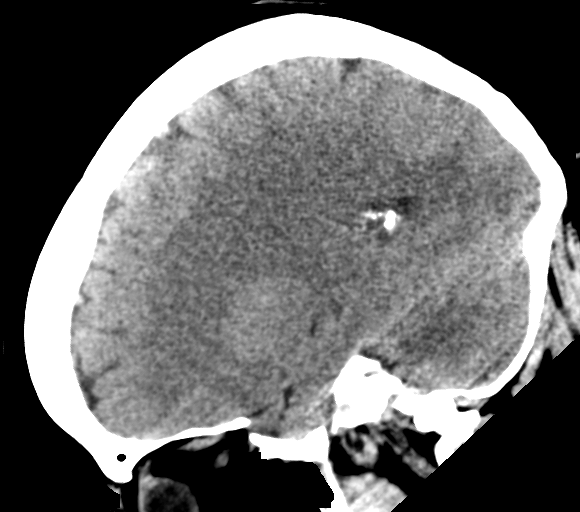
[im 26/52  brain]
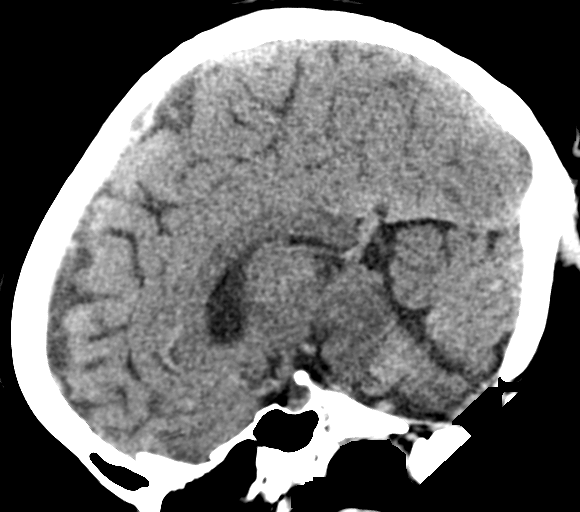
[im 35/52  brain]
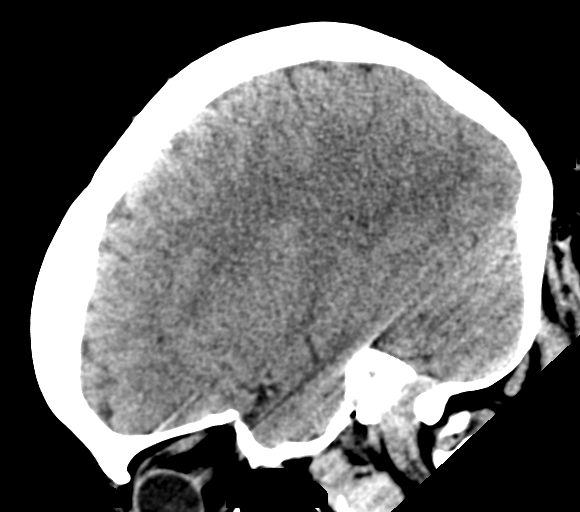

[15 of 37 positions shown; findings below may reference images not displayed]

FINDINGS: Brain: No evidence of acute infarction, hemorrhage, hydrocephalus,
extra-axial collection or mass lesion/mass effect.

Vascular: No hyperdense vessel or unexpected calcification.

Skull: Normal. Negative for fracture or focal lesion.

Sinuses/Orbits: There is partial opacification of the mastoid air
cells. Retention cysts versus polyps noted within bilateral
maxillary sinuses

Other: None
IMPRESSION: 1. No acute intracranial abnormalities. Normal brain.
2. Bilateral mastoid air cell effusions.

## 2019-03-09 IMAGING — CR DG CHEST 2V
1 series · 2 of 2 positions shown · non-contrast
Comparison: Chest radiograph dated [DATE] and chest CT dated
[DATE]

CLINICAL DATA: Chest pain

EXAM:
CHEST - 2 VIEW

[Series 1: dg chest 2 view · 0.14mm/px · 2 of 2 slices shown]
[im 1/2]
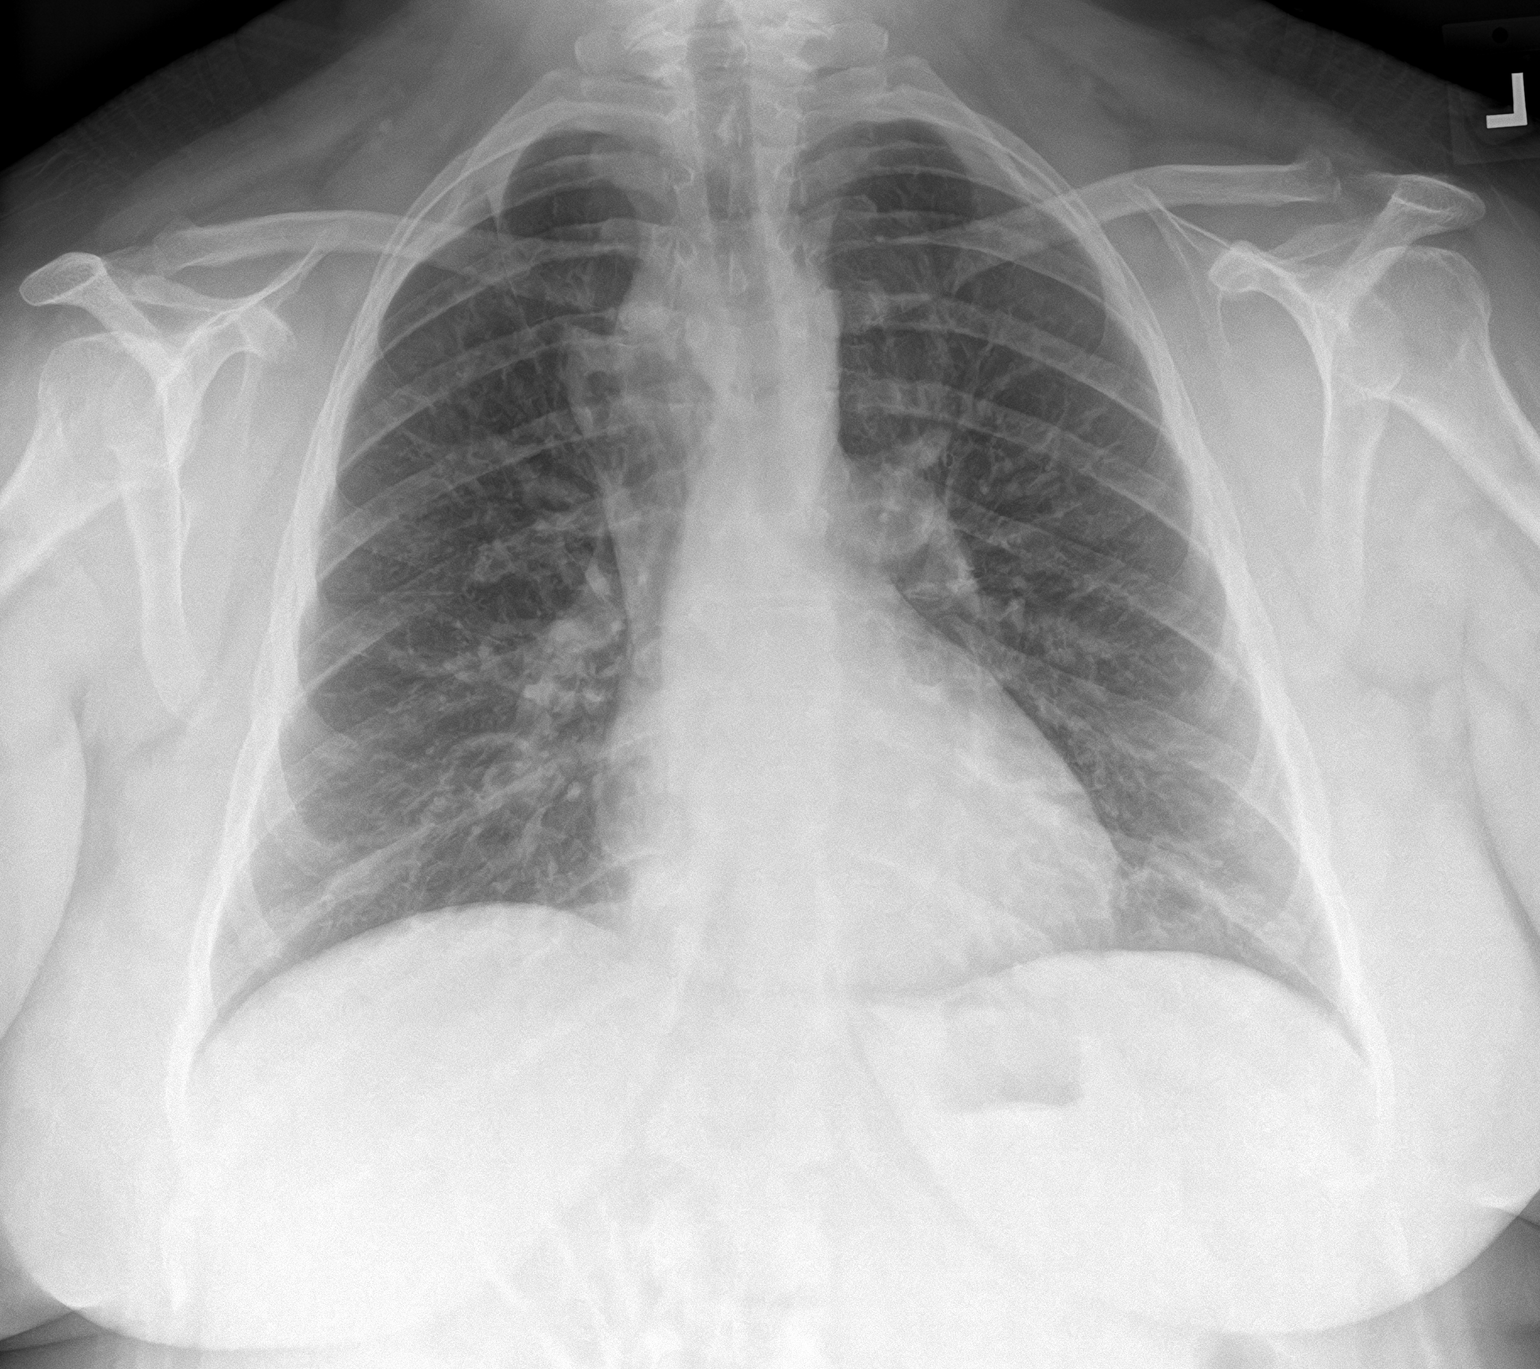
[im 2/2]
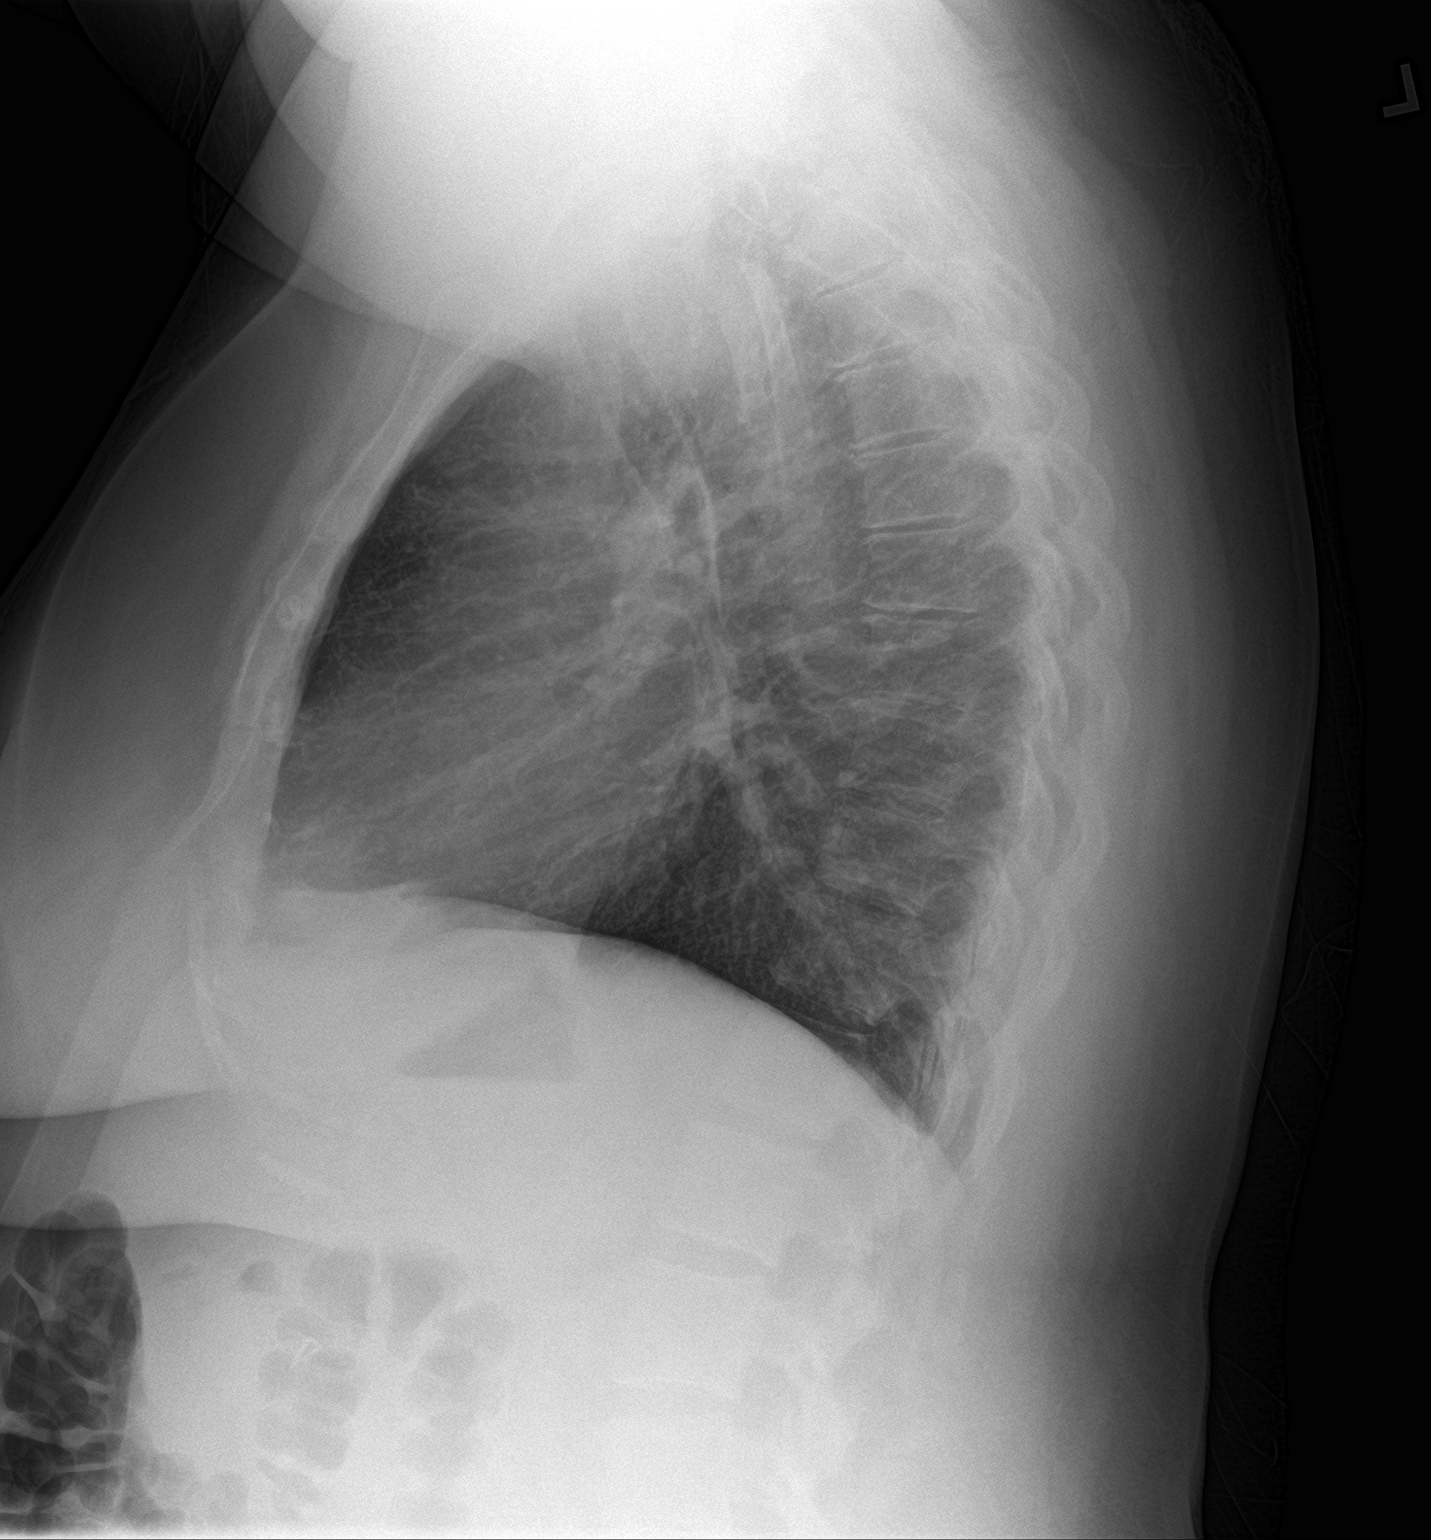

[2 of 2 positions shown; findings below may reference images not displayed]

FINDINGS: The heart size and mediastinal contours are within normal limits.
Both lungs are clear. An azygos lobe is noted. The visualized
skeletal structures are unremarkable.
IMPRESSION: No active cardiopulmonary disease.

## 2019-03-09 MED ORDER — FLUTICASONE PROPIONATE 50 MCG/ACT NA SUSP: 1.0000 | Freq: Every day | NASAL | 2 refills | Status: DC

## 2019-03-09 MED ORDER — FAMOTIDINE 20 MG PO TABS
40.0000 mg | ORAL_TABLET | Freq: Once | ORAL | Status: AC
  Administered 2019-03-09: 17:00:00 40 mg via ORAL
  Filled 2019-03-09: qty 2

## 2019-03-09 NOTE — Discharge Instructions (Signed)
Return with worsening chest pain, chest tightness or shortness of breath.

## 2019-03-09 NOTE — ED Triage Notes (Signed)
Patient c/o HTN at home last night with tingling in her jaw and today started having tightness in her chest

## 2019-03-09 NOTE — ED Notes (Signed)
Pt transported to CT ?

## 2019-03-09 NOTE — ED Provider Notes (Signed)
[facility] Emergency Department Provider Note  ____________________________________________  Time seen: Approximately 3:38 PM  I have reviewed the triage vital signs and the nursing notes.   HISTORY  Chief Complaint Hypertension and Chest Pain    HPI Ashlee Everett is a 48 y.o. female with a history of chest pain, hypertension, SVT and GERD presents to the emergency department with midsternal chest discomfort that patient noticed after eating.  Patient has had a prior cholecystectomy.  Chest pain is currently 2 out of 10 in intensity and does not change with exertion or position. She denies nausea, vomiting or diarrhea.  No associated rhinorrhea, nasal congestion or nonproductive cough.  Associated symptoms include tingling along the right jaw and vertigo that occurs with patient changes in position. Patient is also concerned as she perceives a slight facial droop on the right. Patient states that she currently takes metoprolol for hypertension and has not had any medication changes.  Patient denies pleuritic chest pain with no prior history of DVT or PE.  She denies daily smoking.  No recent travel or prolonged immobilization.        Past Medical History:  Diagnosis Date  . Anginal pain ([place]) 2017  . Cancer ([place]) 2016   BASAL CELL  . Diverticulitis   . GERD (gastroesophageal reflux disease)   . Headache   . History of hiatal hernia    SMALL  . Hypertension   . IBS (irritable bowel syndrome)   . Paroxysmal SVT (supraventricular tachycardia) ([place])   . Sleep apnea    USES CPAP    Patient Active Problem List   Diagnosis Date Noted  . Chest pain 02/16/2015  . Elevated troponin 02/16/2015  . Hypertension 02/16/2015  . Paroxysmal SVT (supraventricular tachycardia) ([place]) 02/16/2015    Past Surgical History:  Procedure Laterality Date  . ABDOMINAL HYSTERECTOMY    . APPENDECTOMY    . CHOLECYSTECTOMY N/A 06/21/2017   Procedure: LAPAROSCOPIC  CHOLECYSTECTOMY;  Surgeon: Herbert Pun, MD;  Location: [facility] ORS;  Service: General;  Laterality: N/A;  . ESOPHAGOGASTRODUODENOSCOPY (EGD) WITH PROPOFOL N/A 06/11/2017   Procedure: ESOPHAGOGASTRODUODENOSCOPY (EGD) WITH PROPOFOL;  Surgeon: Lollie Sails, MD;  Location: [facility] ENDOSCOPY;  Service: Endoscopy;  Laterality: N/A;  . LAPAROSCOPIC LYSIS OF ADHESIONS    . SALPINGOOPHORECTOMY Left   . SIGMOID RESECTION / RECTOPEXY    . TUBAL LIGATION      Prior to Admission medications   Medication Sig Start Date End Date Taking? Authorizing Provider  acetaminophen (TYLENOL) 500 MG tablet Take 500 mg by mouth every 6 (six) hours as needed (for pain.).    [provider]  fluticasone (FLONASE) 50 MCG/ACT nasal spray Place 1 spray into both nostrils daily. 03/09/19 03/08/20  Lannie Fields, PA-C  ibuprofen (ADVIL,MOTRIN) 200 MG tablet Take 400-600 mg by mouth every 8 (eight) hours as needed for mild pain (for pain.).     [provider]  metoprolol tartrate (LOPRESSOR) 25 MG tablet Take 25 mg by mouth 2 (two) times daily.    [provider]  pantoprazole (PROTONIX) 40 MG tablet Take 40 mg by mouth daily before breakfast.    [provider]    Allergies Fioricet [butalbital-apap-caffeine] and Other  Family History  Problem Relation Age of Onset  . Hypertension Mother   . Diabetes Mother   . Lung cancer Father   . Heart attack Father     Social History Social History   Tobacco Use  . Smoking status: Never Smoker  .  Smokeless tobacco: Never Used  Substance Use Topics  . Alcohol use: No    Alcohol/week: 0.0 standard drinks  . Drug use: No     Review of Systems  Constitutional: Patient has vertigo and tingling at the right jaw.  Eyes: No visual changes. No discharge ENT: No upper respiratory complaints. Cardiovascular: Patient has chest pain.  Respiratory: no cough. No SOB. Gastrointestinal: No abdominal pain.  No nausea, no vomiting.  No  diarrhea.  No constipation. Genitourinary: Negative for dysuria. No hematuria Musculoskeletal: Negative for musculoskeletal pain. Skin: Negative for rash, abrasions, lacerations, ecchymosis. Neurological: Negative for headaches, focal weakness or numbness.  ____________________________________________   PHYSICAL EXAM:  VITAL SIGNS: ED Triage Vitals  Enc Vitals Group     BP 03/09/19 1352 (!) 156/89     Pulse Rate 03/09/19 1352 75     Resp 03/09/19 1352 16     Temp 03/09/19 1352 100.1 F (37.8 C)     Temp Source 03/09/19 1352 Oral     SpO2 03/09/19 1352 99 %     Weight 03/09/19 1353 250 lb (113.4 kg)     Height 03/09/19 1353 5\' 1"  (1.549 m)     Head Circumference --      Peak Flow --      Pain Score 03/09/19 1353 8     Pain Loc --      Pain Edu? --      Excl. in [place]? --      Constitutional: Alert and oriented. Well appearing and in no acute distress. Eyes: Conjunctivae are normal. PERRL. EOMI. Head: Atraumatic. ENT:      Nose: No congestion/rhinnorhea.      Mouth/Throat: Mucous membranes are moist.  Neck: No stridor.  No cervical spine tenderness to palpation. Cardiovascular: Normal rate, regular rhythm. Normal S1 and S2.  Good peripheral circulation. Respiratory: Normal respiratory effort without tachypnea or retractions. Lungs CTAB. Good air entry to the bases with no decreased or absent breath sounds. Gastrointestinal: Bowel sounds 4 quadrants. Soft and nontender to palpation. No guarding or rigidity. No palpable masses. No distention. No CVA tenderness. Musculoskeletal: Full range of motion to all extremities. No gross deformities appreciated. Neurologic:  Normal speech and language. No gross focal neurologic deficits are appreciated.  Skin:  Skin is warm, dry and intact. No rash noted. Psychiatric: Mood and affect are normal. Speech and behavior are normal. Patient exhibits appropriate insight and judgement.   ____________________________________________    LABS (all labs ordered are listed, but only abnormal results are displayed)  Labs Reviewed  BASIC METABOLIC PANEL - Abnormal; Notable for the following components:      Result Value   Glucose, Bld 148 (*)    All other components within normal limits  SARS CORONAVIRUS 2 (TAT 6-24 HRS)  CBC  TROPONIN I (HIGH SENSITIVITY)  TROPONIN I (HIGH SENSITIVITY)   ____________________________________________  EKG   ____________________________________________  RADIOLOGY I personally viewed and evaluated these images as part of my medical decision making, as well as reviewing the written report by the radiologist.    Dg Chest 2 View  Result Date: 03/09/2019 CLINICAL DATA:  Chest pain EXAM: CHEST - 2 VIEW COMPARISON:  Chest radiograph dated 02/17/2015 and chest CT dated 11/29/2012 FINDINGS: The heart size and mediastinal contours are within normal limits. Both lungs are clear. An azygos lobe is noted. The visualized skeletal structures are unremarkable. IMPRESSION: No active cardiopulmonary disease. Electronically Signed   By: Zerita Boers M.D.   On: 03/09/2019 16:11  Ct Head Wo Contrast  Result Date: 03/09/2019 CLINICAL DATA:  Evaluate for intracranial hemorrhage. Hypertension. Tightness in chest. EXAM: CT HEAD WITHOUT CONTRAST TECHNIQUE: Contiguous axial images were obtained from the base of the skull through the vertex without intravenous contrast. COMPARISON:  None. FINDINGS: Brain: No evidence of acute infarction, hemorrhage, hydrocephalus, extra-axial collection or mass lesion/mass effect. Vascular: No hyperdense vessel or unexpected calcification. Skull: Normal. Negative for fracture or focal lesion. Sinuses/Orbits: There is partial opacification of the mastoid air cells. Retention cysts versus polyps noted within bilateral maxillary sinuses Other: None IMPRESSION: 1. No acute intracranial abnormalities. Normal brain. 2. Bilateral mastoid air cell effusions. Electronically Signed   By:  Kerby Moors M.D.   On: 03/09/2019 16:43    ____________________________________________    PROCEDURES  Procedure(s) performed:    Procedures    Medications  famotidine (PEPCID) tablet 40 mg (40 mg Oral Given 03/09/19 1648)     ____________________________________________   INITIAL IMPRESSION / ASSESSMENT AND PLAN / ED COURSE  Pertinent labs & imaging results that were available during my care of the patient were reviewed by me and considered in my medical decision making (see chart for details).  Review of the [place] CSRS was performed in accordance of the [place] prior to dispensing any controlled drugs.         Assessment and Plan: Hypertension Chest Pain Concern for facial droop 48 year old female presents to the emergency department with concern for hypertension, vertigo, tingling along the right jaw and new facial droop perceived on the right.  Patient was hypertensive with low-grade fever at triage.  She was satting at 99% on room air.  On physical exam, patient was sitting comfortably with no increased work of breathing.  Her vertigo was reproduced with change of position.  There was no perceived facial droop on physical exam patient's neuro exam was without acute deficits.  Differential diagnosis included intracranial bleed, benign positional vertigo, COVID-19, hypertension...  CBC and BMP were reassuring.  Initial troponin was within reference range.  EKG revealed normal sinus rhythm without ischemic changes or other apparent arrhythmia.  We will obtain repeat troponin, chest x-ray and CT head and will reassess.  Repeat troponin within reference range.  Chest x-ray reveals no consolidations, opacities or infiltrates that would suggest community-acquired pneumonia.  CT head reveals no evidence of intracranial bleed.  COVID-19 testing is pending at this time.  Patient reported that her chest pain had improved significantly after famotidine was  administered.  Advised patient to stay quarantined in her home until COVID-19 results return as she had low-grade fever at triage and has experienced symptoms consistent with COVID-19.  She voiced understanding and states that she could return to the emergency department with worsening chest pain, chest tightness or shortness of breath. ____________________________________________  FINAL CLINICAL IMPRESSION(S) / ED DIAGNOSES  Final diagnoses:  Chest pain, unspecified type  Vertigo  Fever, unspecified fever cause      NEW MEDICATIONS STARTED DURING THIS VISIT:  ED Discharge Orders         Ordered    fluticasone (FLONASE) 50 MCG/ACT nasal spray  Daily     03/09/19 1758              This chart was dictated using voice recognition software/Dragon. Despite best efforts to proofread, errors can occur which can change the meaning. Any change was purely unintentional.    Lannie Fields, PA-C 03/09/19 1804    Merlyn Lot, MD 03/09/19 2012

## 2019-03-10 LAB — SARS CORONAVIRUS 2 (TAT 6-24 HRS): SARS Coronavirus 2: NEGATIVE

## 2019-10-15 ENCOUNTER — Encounter: Payer: Self-pay | Admitting: Emergency Medicine

## 2019-10-15 ENCOUNTER — Emergency Department
Admission: EM | Admit: 2019-10-15 | Discharge: 2019-10-15 | Disposition: A | Discharge status: Home / Self Care | Payer: No Typology Code available for payment source | Attending: Emergency Medicine | Admitting: Emergency Medicine

## 2019-10-15 ENCOUNTER — Other Ambulatory Visit: Payer: Self-pay

## 2019-10-15 ENCOUNTER — Emergency Department: Payer: No Typology Code available for payment source

## 2019-10-15 DIAGNOSIS — R079 Chest pain, unspecified: Secondary | ICD-10-CM | POA: Diagnosis not present

## 2019-10-15 DIAGNOSIS — I10 Essential (primary) hypertension: Secondary | ICD-10-CM | POA: Insufficient documentation

## 2019-10-15 DIAGNOSIS — Z85828 Personal history of other malignant neoplasm of skin: Secondary | ICD-10-CM | POA: Diagnosis not present

## 2019-10-15 LAB — BASIC METABOLIC PANEL
Anion gap: 8 (ref 5–15)
BUN: 12 mg/dL (ref 6–20)
CO2: 27 mmol/L (ref 22–32)
Calcium: 8.5 mg/dL — ABNORMAL LOW (ref 8.9–10.3)
Chloride: 105 mmol/L (ref 98–111)
Creatinine, Ser: 0.68 mg/dL (ref 0.44–1.00)
GFR calc Af Amer: >60 mL/min (ref >60)
GFR calc non Af Amer: >60 mL/min (ref >60)
Glucose, Bld: 102 mg/dL — ABNORMAL HIGH (ref 70–99)
Potassium: 3.5 mmol/L (ref 3.5–5.1)
Sodium: 140 mmol/L (ref 135–145)

## 2019-10-15 LAB — CBC
HCT: 39.9 % (ref 36.0–46.0)
Hemoglobin: 13.7 g/dL (ref 12.0–15.0)
MCH: 30.3 pg (ref 26.0–34.0)
MCHC: 34.3 g/dL (ref 30.0–36.0)
MCV: 88.3 fL (ref 80.0–100.0)
Platelets: 351 10*3/uL (ref 150–400)
RBC: 4.52 MIL/uL (ref 3.87–5.11)
RDW: 13.3 % (ref 11.5–15.5)
WBC: 10 10*3/uL (ref 4.0–10.5)
nRBC: 0 % (ref 0.0–0.2)

## 2019-10-15 LAB — TROPONIN I (HIGH SENSITIVITY)
Troponin I (High Sensitivity): <2 ng/L (ref <18)
Troponin I (High Sensitivity): <2 ng/L (ref <18)

## 2019-10-15 IMAGING — CR DG CHEST 2V
2 series · 2 of 2 positions shown · non-contrast
Comparison: [DATE]

CLINICAL DATA: Chest pain

EXAM:
CHEST - 2 VIEW

[chest pa]
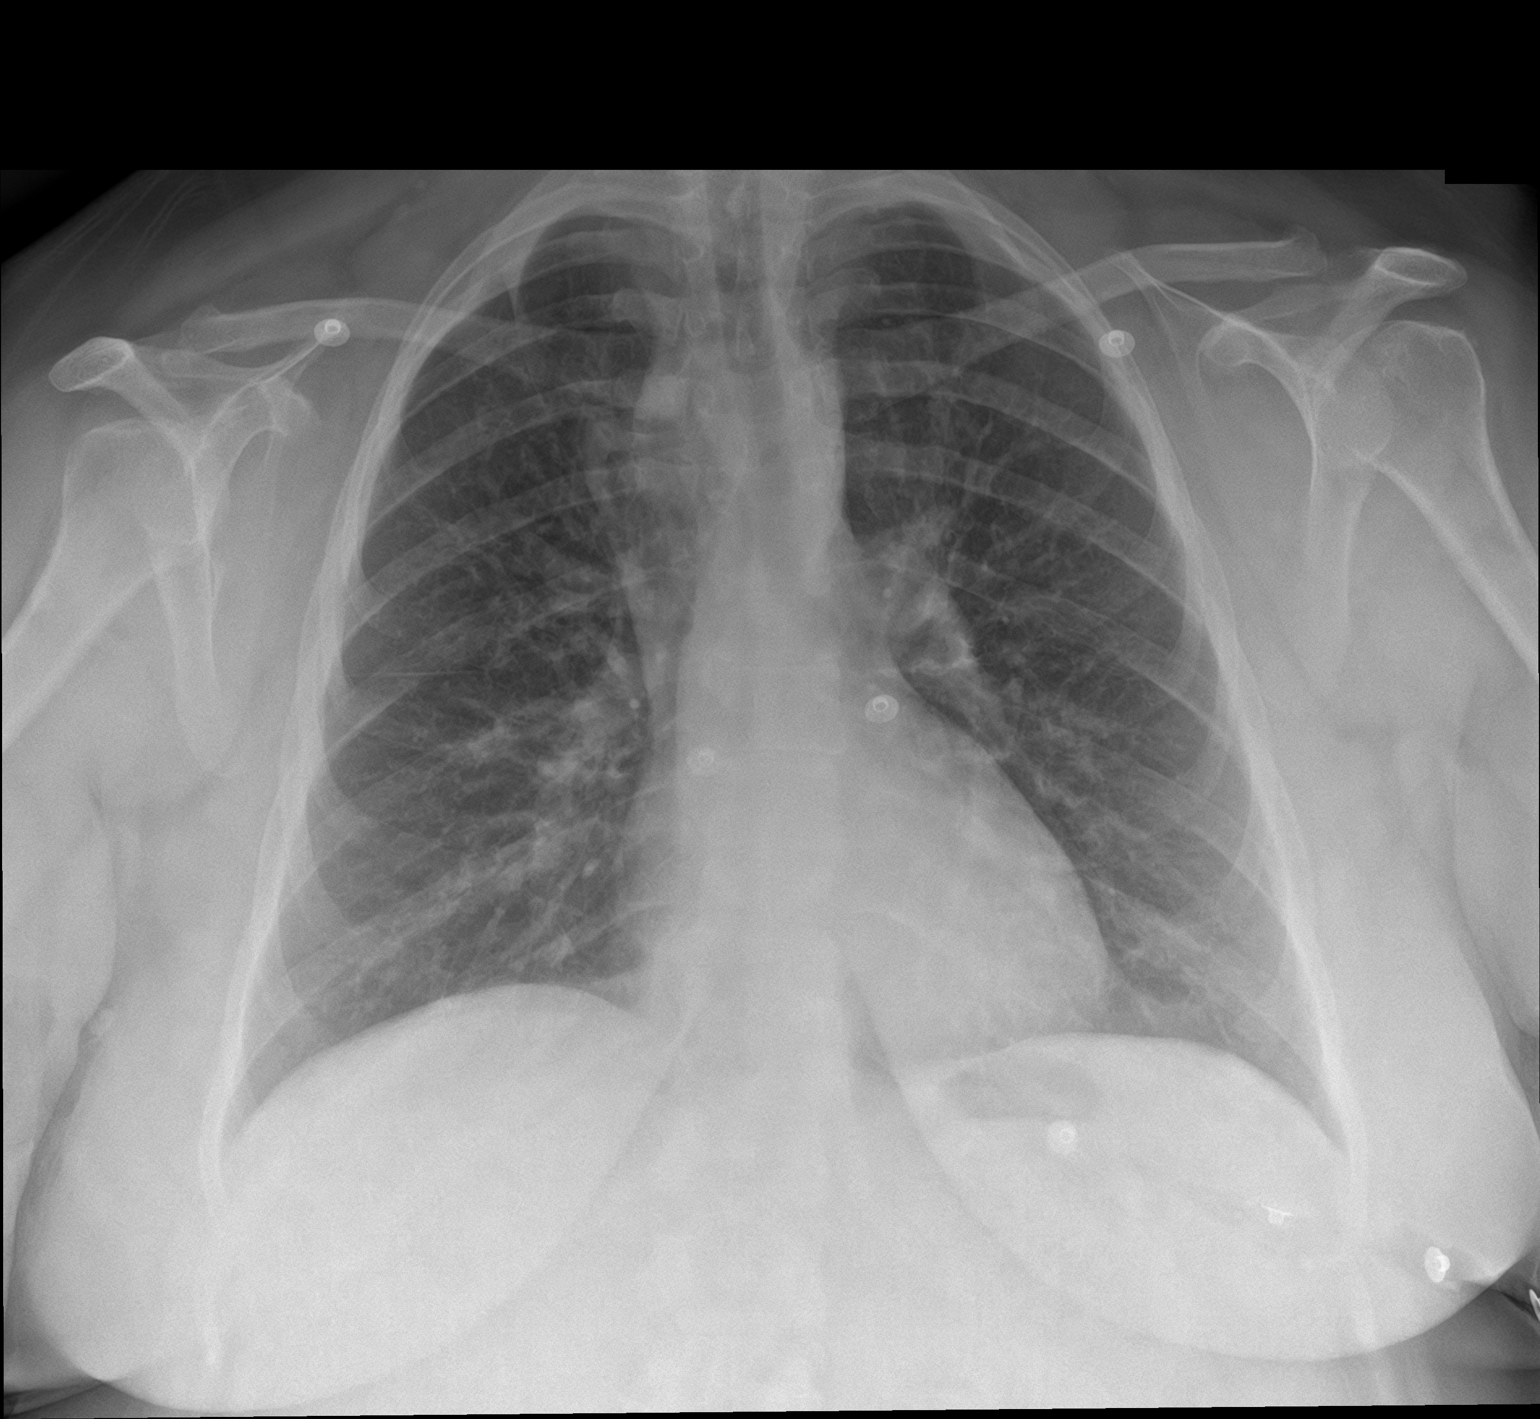

[chest lat]
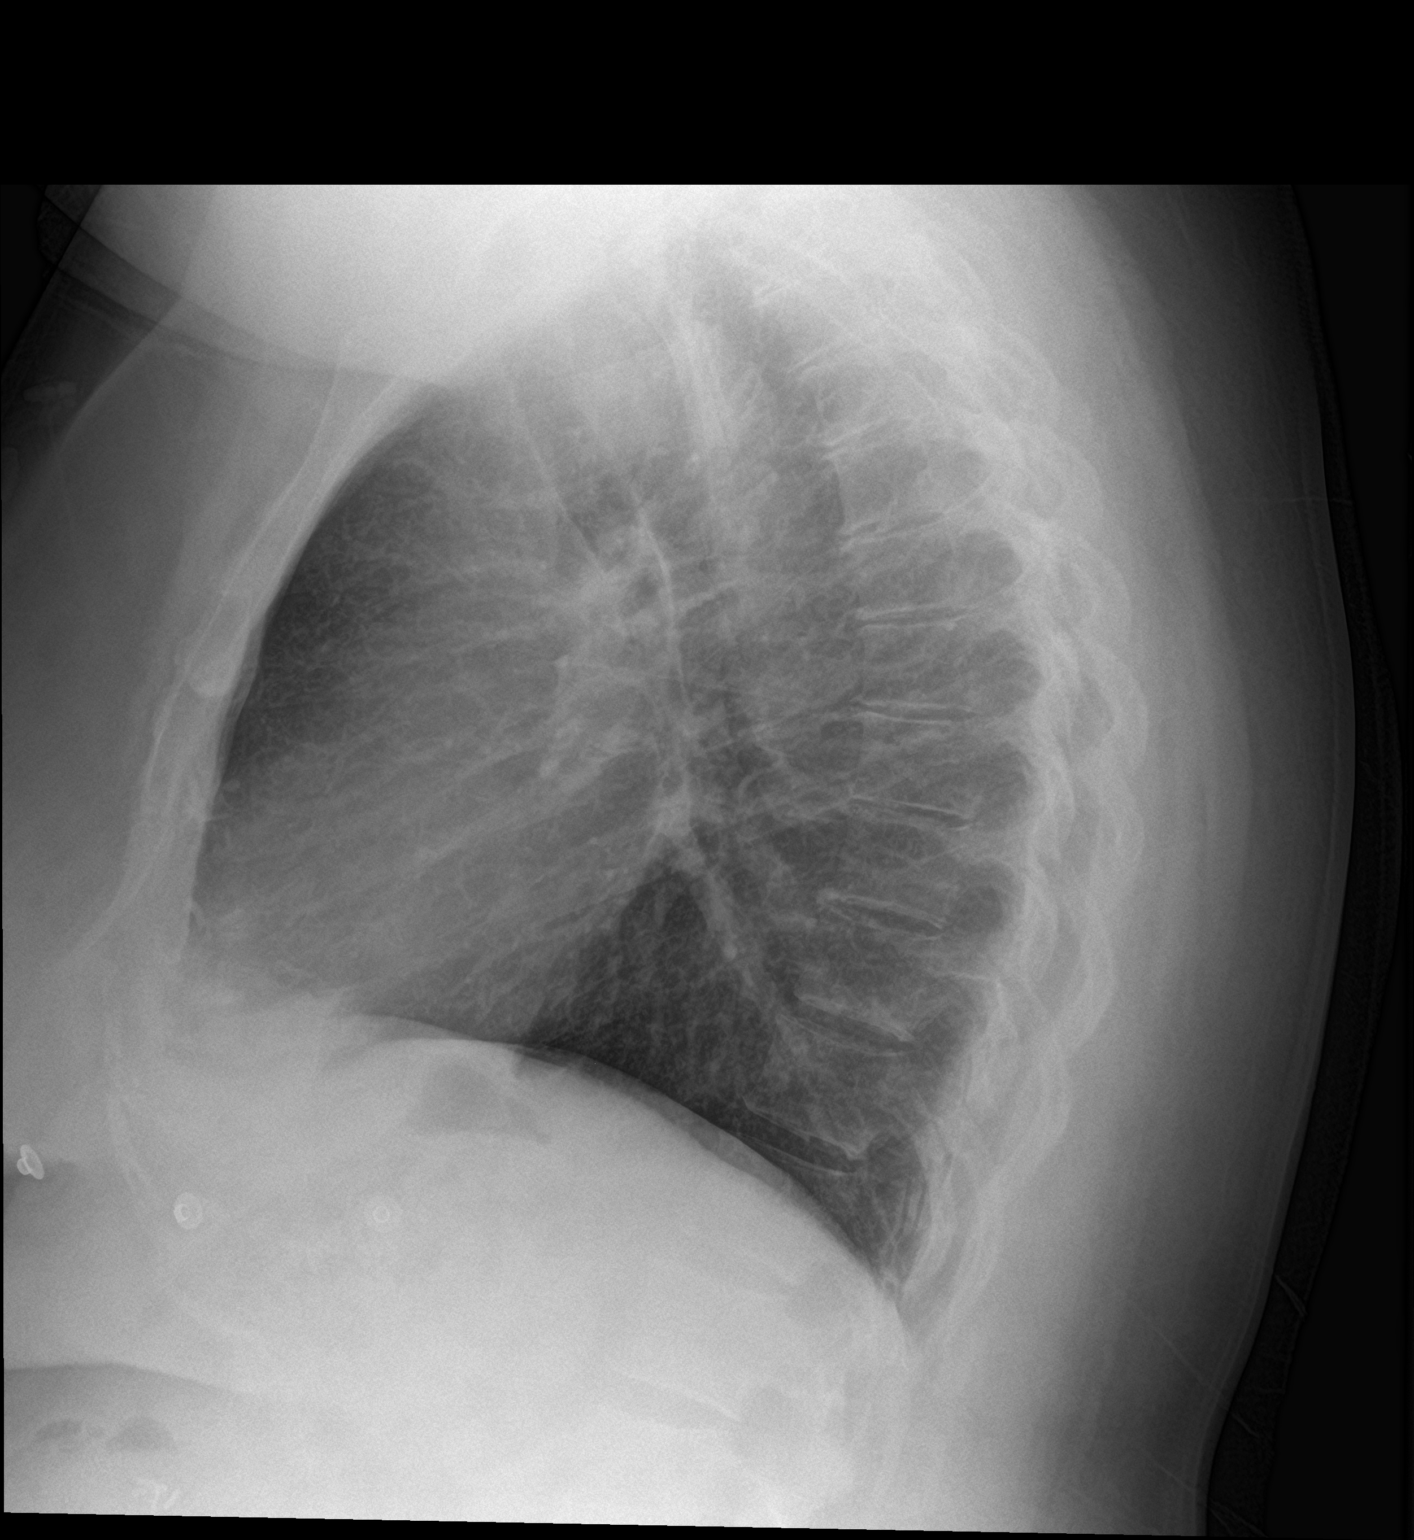

[2 of 2 positions shown; findings below may reference images not displayed]

FINDINGS: The heart size and mediastinal contours are within normal limits.
Both lungs are clear. The visualized skeletal structures are
unremarkable.
IMPRESSION: No active cardiopulmonary disease.

## 2019-10-15 MED ORDER — NITROGLYCERIN 0.4 MG SL SUBL
0.4000 mg | SUBLINGUAL_TABLET | SUBLINGUAL | Status: AC
  Administered 2019-10-15: 17:00:00 0.4 mg via SUBLINGUAL
  Filled 2019-10-15: qty 1

## 2019-10-15 NOTE — Discharge Instructions (Signed)

## 2019-10-15 NOTE — ED Provider Notes (Signed)
[facility] Emergency Department Provider Note   ____________________________________________   First MD Initiated Contact with Patient 10/15/19 1616     (approximate)  I have reviewed the triage vital signs and the nursing notes.   HISTORY  Chief Complaint Chest Pain    HPI A 49 year old patient with a history of hypertension and obesity presents for evaluation of chest pain. Initial onset of pain was less than one hour ago. The patient's chest pain is described as heaviness/pressure/tightness, is not worse with exertion and is relieved by nitroglycerin. The patient's chest pain is middle- or left-sided, is not well-localized, is not sharp and does not radiate to the arms/jaw/neck. The patient does not complain of nausea and denies diaphoresis. The patient has no history of stroke, has no history of peripheral artery disease, has not smoked in the past 90 days, denies any history of treated diabetes, has no relevant family history of coronary artery disease (first degree relative at less than age 26) and has no history of hypercholesterolemia.    Began having a discomfort across her chest while at work.  She was given aspirin by EMS as well as nitroglycerin with mild relief.  Currently ports just a very mild sense of discomfort across her chest that is a tightness.  No long trips or travel.  No history of blood clots.  Does not take any estrogen or hormones.  No unilateral leg swelling.  No sharp chest pains.  No shortness of breath.  Follows with [facility] cardiology  Past Medical History:  Diagnosis Date  . Anginal pain ([place]) 2017  . Cancer ([place]) 2016   BASAL CELL  . Diverticulitis   . GERD (gastroesophageal reflux disease)   . Headache   . History of hiatal hernia    SMALL  . Hypertension   . IBS (irritable bowel syndrome)   . Paroxysmal SVT (supraventricular tachycardia) ([place])   . Sleep apnea    USES CPAP    Patient Active Problem List    Diagnosis Date Noted  . Chest pain 02/16/2015  . Elevated troponin 02/16/2015  . Hypertension 02/16/2015  . Paroxysmal SVT (supraventricular tachycardia) ([place]) 02/16/2015    Past Surgical History:  Procedure Laterality Date  . ABDOMINAL HYSTERECTOMY    . APPENDECTOMY    . CHOLECYSTECTOMY N/A 06/21/2017   Procedure: LAPAROSCOPIC CHOLECYSTECTOMY;  Surgeon: Herbert Pun, MD;  Location: [facility] ORS;  Service: General;  Laterality: N/A;  . ESOPHAGOGASTRODUODENOSCOPY (EGD) WITH PROPOFOL N/A 06/11/2017   Procedure: ESOPHAGOGASTRODUODENOSCOPY (EGD) WITH PROPOFOL;  Surgeon: Lollie Sails, MD;  Location: [facility] ENDOSCOPY;  Service: Endoscopy;  Laterality: N/A;  . LAPAROSCOPIC LYSIS OF ADHESIONS    . SALPINGOOPHORECTOMY Left   . SIGMOID RESECTION / RECTOPEXY    . TUBAL LIGATION      Prior to Admission medications   Medication Sig Start Date End Date Taking? Authorizing Provider  acetaminophen (TYLENOL) 500 MG tablet Take 500 mg by mouth every 6 (six) hours as needed (for pain.).    [provider]  fluticasone (FLONASE) 50 MCG/ACT nasal spray Place 1 spray into both nostrils daily. 03/09/19 03/08/20  Lannie Fields, PA-C  ibuprofen (ADVIL,MOTRIN) 200 MG tablet Take 400-600 mg by mouth every 8 (eight) hours as needed for mild pain (for pain.).     [provider]  metoprolol tartrate (LOPRESSOR) 25 MG tablet Take 25 mg by mouth 2 (two) times daily.    [provider]  pantoprazole (PROTONIX) 40 MG tablet Take 40 mg by  mouth daily before breakfast.    [provider]    Allergies Fioricet [butalbital-apap-caffeine] and Other  Family History  Problem Relation Age of Onset  . Hypertension Mother   . Diabetes Mother   . Lung cancer Father   . Heart attack Father     Social History Social History   Tobacco Use  . Smoking status: Never Smoker  . Smokeless tobacco: Never Used  Vaping Use  . Vaping Use: Never used  Substance Use Topics  .  Alcohol use: No    Alcohol/week: 0.0 standard drinks  . Drug use: No    Review of Systems Constitutional: No fever/chills or recent illness Eyes: No visual changes. ENT: No sore throat. Cardiovascular: See HPI.  No radiation of pain to the neck back arm.  No movement of the pain.  No ripping or tearing. Respiratory: Denies shortness of breath. Gastrointestinal: No abdominal pain.   Genitourinary: Negative for dysuria. Musculoskeletal: Negative for back pain. Skin: Negative for rash. Neurological: Negative for headaches, areas of focal weakness or numbness.    ____________________________________________   PHYSICAL EXAM:  VITAL SIGNS: ED Triage Vitals  Enc Vitals Group     BP 10/15/19 1155 (!) 159/84     Pulse Rate 10/15/19 1155 85     Resp 10/15/19 1155 16     Temp 10/15/19 1155 98.9 F (37.2 C)     Temp Source 10/15/19 1155 Oral     SpO2 10/15/19 1155 98 %     Weight 10/15/19 1150 249 lb 1.9 oz (113 kg)     Height 10/15/19 1150 5\' 1"  (1.549 m)     Head Circumference --      Peak Flow --      Pain Score --      Pain Loc --      Pain Edu? --      Excl. in [place]? --     Constitutional: Alert and oriented. Well appearing and in no acute distress. Eyes: Conjunctivae are normal. Head: Atraumatic. Nose: No congestion/rhinnorhea. Mouth/Throat: Mucous membranes are moist. Neck: No stridor.  Cardiovascular: Normal rate, regular rhythm. Grossly normal heart sounds.  Good peripheral circulation. Respiratory: Normal respiratory effort.  No retractions. Lungs CTAB. Gastrointestinal: Soft and nontender. No distention.  Negative Murphy. Musculoskeletal: No lower extremity tenderness nor edema. Neurologic:  Normal speech and language. No gross focal neurologic deficits are appreciated.  Skin:  Skin is warm, dry and intact. No rash noted. Psychiatric: Mood and affect are normal. Speech and behavior are normal.  ____________________________________________   LABS (all labs  ordered are listed, but only abnormal results are displayed)  Labs Reviewed  BASIC METABOLIC PANEL - Abnormal; Notable for the following components:      Result Value   Glucose, Bld 102 (*)    Calcium 8.5 (*)    All other components within normal limits  CBC  TROPONIN I (HIGH SENSITIVITY)  TROPONIN I (HIGH SENSITIVITY)   ____________________________________________  EKG  ED ECG REPORT I, Delman Kitten, the attending physician, personally viewed and interpreted this ECG.  Date: 10/15/2019 EKG Time: 1145 Rate: 85 Rhythm: normal sinus rhythm QRS Axis: normal Intervals: normal ST/T Wave abnormalities: normal Narrative Interpretation: no evidence of acute ischemia, possible old septal infarct  ____________________________________________  RADIOLOGY  DG Chest 2 View  Result Date: 10/15/2019 CLINICAL DATA:  Chest pain EXAM: CHEST - 2 VIEW COMPARISON:  03/09/2019 FINDINGS: The heart size and mediastinal contours are within normal limits. Both lungs are clear. The visualized  skeletal structures are unremarkable. IMPRESSION: No active cardiopulmonary disease. Electronically Signed   By: Rolm Baptise M.D.   On: 10/15/2019 12:35    Imaging reviewed negative for acute ____________________________________________   PROCEDURES  Procedure(s) performed: None  Procedures  Critical Care performed: No  ____________________________________________   INITIAL IMPRESSION / ASSESSMENT AND PLAN / ED COURSE  Pertinent labs & imaging results that were available during my care of the patient were reviewed by me and considered in my medical decision making (see chart for details).   Differential diagnosis includes, but is not limited to, ACS, aortic dissection, pulmonary embolism, cardiac tamponade, pneumothorax, pneumonia, pericarditis, myocarditis, GI-related causes including esophagitis/gastritis, and musculoskeletal chest wall pain.    No signs or symptoms suggest dissection or pulmonary  embolism.  Has a history of prior abnormal stress test, but recent cardiology note she had a CT coronary that was reassuring.  ----------------------------------------- 5:38 PM on 10/15/2019 -----------------------------------------   Patient reports very minimal discomfort in the chest now after 1 nitroglycerin here.  Have paged cardiology, discussed case with Dr. Clayborn Bigness.  He advises that he would recommend discharge with close cardiac follow-up consider changing patient to diltiazem from her metoprolol.  I discussed with the patient, she would preference to discuss with her cardiologist tomorrow any potential medication changes I would like to continue on her Lopressor at this time which after discussing potential risks or benefits of medication change I think is quite reasonable shared medical decision making.  She is feeling improved.  Her heart score indicates low risk for major cardiac event.  She has a cardiologist and she can follow-up closely with [facility] cardiology.  Work-up in the ER quite reassuring.  HEAR Score: 3      Return precautions and treatment recommendations and follow-up discussed with the patient who is agreeable with the plan.   ____________________________________________   FINAL CLINICAL IMPRESSION(S) / ED DIAGNOSES  Final diagnoses:  Chest pain with low risk for cardiac etiology        Note:  This document was prepared using Dragon voice recognition software and may include unintentional dictation errors       Delman Kitten, MD 10/15/19 [place]

## 2019-10-15 NOTE — ED Triage Notes (Signed)
Pt in via EMS from work. EMS reports pt leaned forward and started with substernal CP. Pt with hx of angina. Pt had 324mg  of asa and 1 hit of nitro. 228/140, now 156/97, 10/10 non radiating CP

## 2019-10-15 NOTE — ED Notes (Signed)
CP reassessment. Pt st "feeling better" st central chest pain 6/10 at this time.  St no longer feeling dizzy.

## 2019-10-15 NOTE — ED Triage Notes (Signed)
Pt called from [facility] to treatment room, no response 

## 2019-10-15 NOTE — ED Notes (Signed)
Pt able to ambulate to toilet independently.  Pt denies dizziness

## 2020-09-18 ENCOUNTER — Other Ambulatory Visit: Payer: Self-pay

## 2020-09-18 ENCOUNTER — Emergency Department
Admission: EM | Admit: 2020-09-18 | Discharge: 2020-09-18 | Disposition: A | Discharge status: Home / Self Care | Payer: BLUE CROSS/BLUE SHIELD | Attending: Emergency Medicine | Admitting: Emergency Medicine

## 2020-09-18 ENCOUNTER — Encounter: Payer: Self-pay | Admitting: Emergency Medicine

## 2020-09-18 ENCOUNTER — Emergency Department: Payer: BLUE CROSS/BLUE SHIELD

## 2020-09-18 DIAGNOSIS — Z85828 Personal history of other malignant neoplasm of skin: Secondary | ICD-10-CM | POA: Insufficient documentation

## 2020-09-18 DIAGNOSIS — M542 Cervicalgia: Secondary | ICD-10-CM | POA: Diagnosis not present

## 2020-09-18 DIAGNOSIS — Z79899 Other long term (current) drug therapy: Secondary | ICD-10-CM | POA: Diagnosis not present

## 2020-09-18 DIAGNOSIS — R0789 Other chest pain: Secondary | ICD-10-CM | POA: Insufficient documentation

## 2020-09-18 DIAGNOSIS — I10 Essential (primary) hypertension: Secondary | ICD-10-CM | POA: Diagnosis not present

## 2020-09-18 DIAGNOSIS — R079 Chest pain, unspecified: Secondary | ICD-10-CM

## 2020-09-18 LAB — BASIC METABOLIC PANEL
Anion gap: 7 (ref 5–15)
BUN: 13 mg/dL (ref 6–20)
CO2: 27 mmol/L (ref 22–32)
Calcium: 8.9 mg/dL (ref 8.9–10.3)
Chloride: 105 mmol/L (ref 98–111)
Creatinine, Ser: 0.67 mg/dL (ref 0.44–1.00)
GFR, Estimated: >60 mL/min (ref >60)
Glucose, Bld: 154 mg/dL — ABNORMAL HIGH (ref 70–99)
Potassium: 3.4 mmol/L — ABNORMAL LOW (ref 3.5–5.1)
Sodium: 139 mmol/L (ref 135–145)

## 2020-09-18 LAB — CBC
HCT: 41.9 % (ref 36.0–46.0)
Hemoglobin: 13.9 g/dL (ref 12.0–15.0)
MCH: 29.5 pg (ref 26.0–34.0)
MCHC: 33.2 g/dL (ref 30.0–36.0)
MCV: 89 fL (ref 80.0–100.0)
Platelets: 331 10*3/uL (ref 150–400)
RBC: 4.71 MIL/uL (ref 3.87–5.11)
RDW: 13 % (ref 11.5–15.5)
WBC: 7.7 10*3/uL (ref 4.0–10.5)
nRBC: 0 % (ref 0.0–0.2)

## 2020-09-18 LAB — TROPONIN I (HIGH SENSITIVITY)
Troponin I (High Sensitivity): <2 ng/L (ref <18)
Troponin I (High Sensitivity): 2 ng/L (ref <18)

## 2020-09-18 IMAGING — CR DG CHEST 2V
2 series · 2 of 2 positions shown · non-contrast
Comparison: Prior chest radiographs [DATE] and earlier. Chest
CT [DATE].

CLINICAL DATA: Chest pain. Additional provided: Left-sided chest
pain which radiates into left arm 1 hour, pain described as sharp
and constant. Patient reports history of "reversed aorta."

EXAM:
CHEST - 2 VIEW

[chest pa]
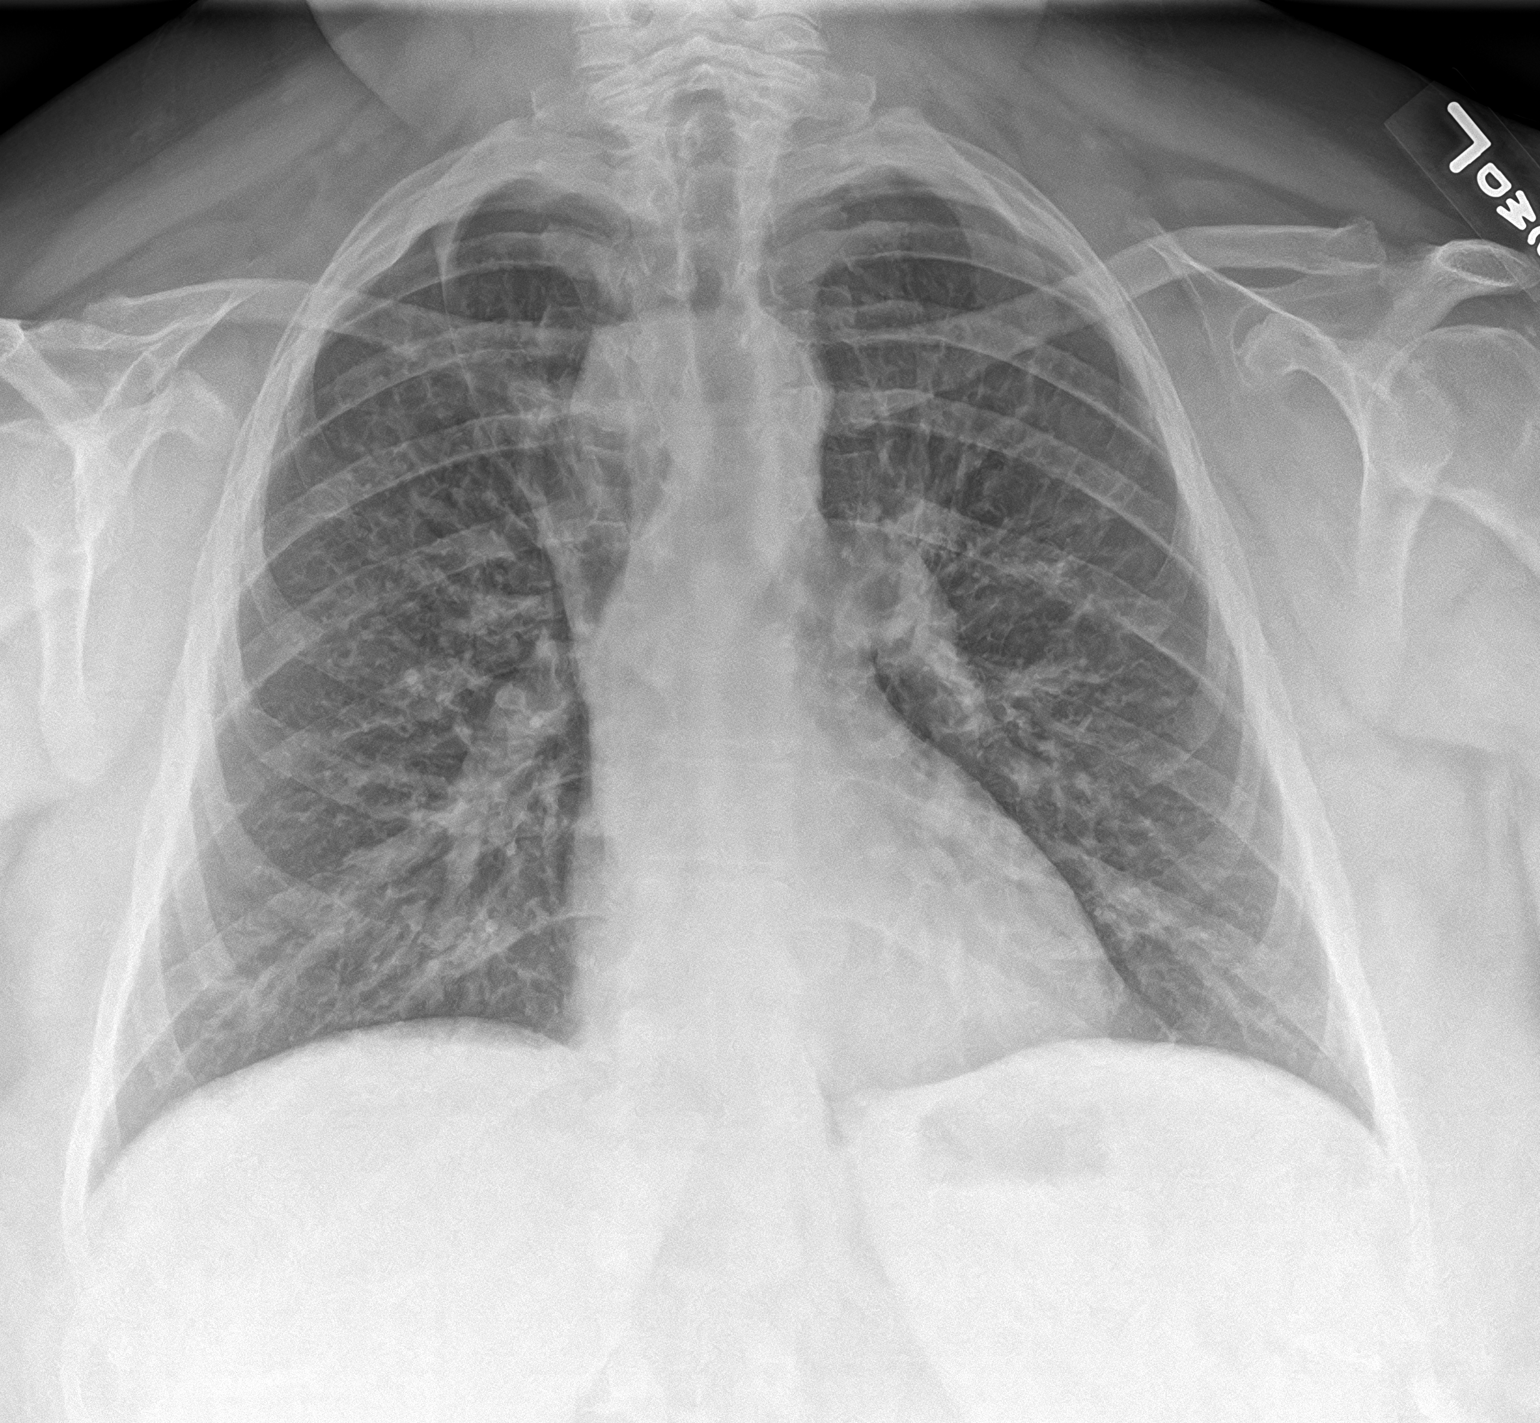

[chest lat]
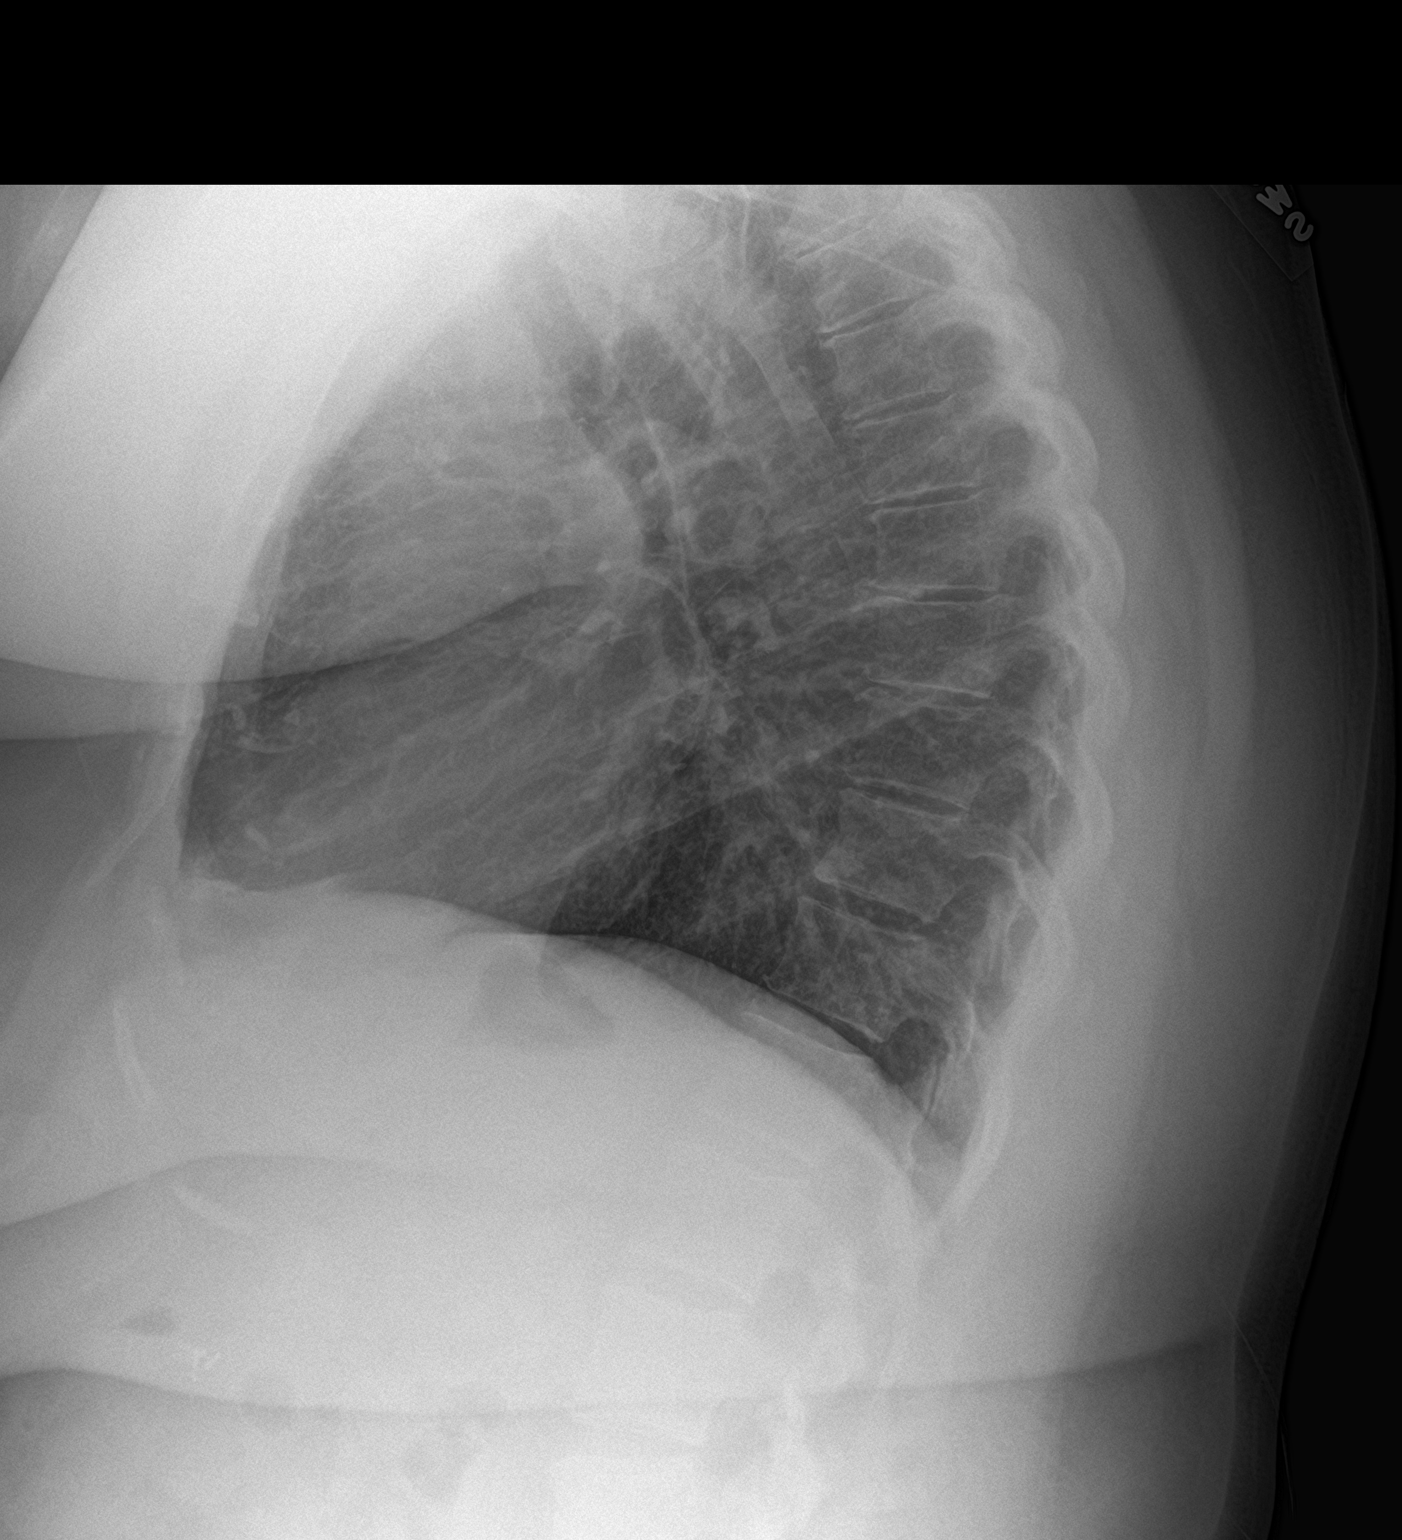

[2 of 2 positions shown; findings below may reference images not displayed]

FINDINGS: Right-sided aortic arch. Heart size within normal limits. No
appreciable airspace consolidation or pulmonary edema. No evidence
of pleural effusion or pneumothorax. Incidentally noted azygos
fissure. No acute bony abnormality identified. Surgical clips within
the upper abdomen.
IMPRESSION: No evidence of acute cardiopulmonary abnormality.

Redemonstrated right-sided aortic arch.

## 2020-09-18 MED ORDER — ALUM & MAG HYDROXIDE-SIMETH 200-200-20 MG/5ML PO SUSP
30.0000 mL | Freq: Once | ORAL | Status: AC
  Administered 2020-09-18: 30 mL via ORAL
  Filled 2020-09-18: qty 30

## 2020-09-18 MED ORDER — ORPHENADRINE CITRATE 30 MG/ML IJ SOLN
60.0000 mg | Freq: Once | INTRAMUSCULAR | Status: AC
  Administered 2020-09-18: 60 mg via INTRAMUSCULAR
  Filled 2020-09-18 (×2): qty 2

## 2020-09-18 MED ORDER — KETOROLAC TROMETHAMINE 30 MG/ML IJ SOLN
30.0000 mg | Freq: Once | INTRAMUSCULAR | Status: AC
  Administered 2020-09-18: 30 mg via INTRAMUSCULAR
  Filled 2020-09-18: qty 1

## 2020-09-18 MED ORDER — LIDOCAINE VISCOUS HCL 2 % MT SOLN
15.0000 mL | Freq: Once | OROMUCOSAL | Status: AC
  Administered 2020-09-18: 15 mL via ORAL
  Filled 2020-09-18: qty 15

## 2020-09-18 NOTE — ED Provider Notes (Signed)
[facility] Emergency Department Provider Note  ____________________________________________  Time seen: Approximately 3:17 PM  I have reviewed the triage vital signs and the nursing notes.   HISTORY  Chief Complaint Chest Pain    HPI Ashlee Everett is a 50 y.o. female who presents the emergency department complaining of chest pain with pain radiating down the left arm.  Symptoms began this morning.  She does have a history of stable angina but no history of STEMI or NSTEMI.  Patient states that she was getting up this morning, she was getting up she is somewhat slipped and jarred her neck.  She did have a little bit of neck pain initially and then started to feel chest tightness and pain radiating down the left arm.  Symptoms are still present in regards to arm pain but other symptoms have improved.  There is no shortness of breath currently.  Patient denies any palpitations.  No nausea, vomiting.  No recent URI symptoms.  Patient has a history of angina, diverticulitis, GERD, hypertension and sleep apnea.  She does also have a history of paroxysmal SVT.       Past Medical History:  Diagnosis Date   Anginal pain ([place]) 2017   Cancer ([place]) 2016   BASAL CELL   Diverticulitis    GERD (gastroesophageal reflux disease)    Headache    History of hiatal hernia    SMALL   Hypertension    IBS (irritable bowel syndrome)    Paroxysmal SVT (supraventricular tachycardia) (HCC)    Sleep apnea    USES CPAP    Patient Active Problem List   Diagnosis Date Noted   Chest pain 02/16/2015   Elevated troponin 02/16/2015   Hypertension 02/16/2015   Paroxysmal SVT (supraventricular tachycardia) ([place]) 02/16/2015    Past Surgical History:  Procedure Laterality Date   ABDOMINAL HYSTERECTOMY     APPENDECTOMY     CHOLECYSTECTOMY N/A 06/21/2017   Procedure: LAPAROSCOPIC CHOLECYSTECTOMY;  Surgeon: Herbert Pun, MD;  Location: [facility] ORS;  Service: General;   Laterality: N/A;   ESOPHAGOGASTRODUODENOSCOPY (EGD) WITH PROPOFOL N/A 06/11/2017   Procedure: ESOPHAGOGASTRODUODENOSCOPY (EGD) WITH PROPOFOL;  Surgeon: Lollie Sails, MD;  Location: [facility] ENDOSCOPY;  Service: Endoscopy;  Laterality: N/A;   LAPAROSCOPIC LYSIS OF ADHESIONS     SALPINGOOPHORECTOMY Left    SIGMOID RESECTION / RECTOPEXY     TUBAL LIGATION      Prior to Admission medications   Medication Sig Start Date End Date Taking? Authorizing Provider  acetaminophen (TYLENOL) 500 MG tablet Take 500 mg by mouth every 6 (six) hours as needed (for pain.).    [provider]  fluticasone (FLONASE) 50 MCG/ACT nasal spray Place 1 spray into both nostrils daily. 03/09/19 03/08/20  Lannie Fields, PA-C  ibuprofen (ADVIL,MOTRIN) 200 MG tablet Take 400-600 mg by mouth every 8 (eight) hours as needed for mild pain (for pain.).     [provider]  metoprolol tartrate (LOPRESSOR) 25 MG tablet Take 25 mg by mouth 2 (two) times daily.    [provider]  pantoprazole (PROTONIX) 40 MG tablet Take 40 mg by mouth daily before breakfast.    [provider]    Allergies Fioricet [butalbital-apap-caffeine] and Other  Family History  Problem Relation Age of Onset   Hypertension Mother    Diabetes Mother    Lung cancer Father    Heart attack Father     Social History Social History   Tobacco Use   Smoking status:  Never   Smokeless tobacco: Never  Vaping Use   Vaping Use: Never used  Substance Use Topics   Alcohol use: No    Alcohol/week: 0.0 standard drinks   Drug use: No     Review of Systems  Constitutional: No fever/chills Eyes: No visual changes. No discharge ENT: No upper respiratory complaints. Cardiovascular: Positive for chest tightness with pain running down the left arm Respiratory: no cough. No SOB. Gastrointestinal: No abdominal pain.  No nausea, no vomiting.  No diarrhea.  No constipation. Genitourinary: Negative for dysuria. No  hematuria Musculoskeletal: Negative for musculoskeletal pain. Skin: Negative for rash, abrasions, lacerations, ecchymosis. Neurological: Negative for headaches, focal weakness or numbness.  10 System ROS otherwise negative.  ____________________________________________   PHYSICAL EXAM:  VITAL SIGNS: ED Triage Vitals  Enc Vitals Group     BP 09/18/20 1118 (!) 152/94     Pulse Rate 09/18/20 1118 88     Resp --      Temp 09/18/20 1118 99.2 F (37.3 C)     Temp Source 09/18/20 1118 Oral     SpO2 09/18/20 1118 94 %     Weight 09/18/20 1121 249 lb 1.9 oz (113 kg)     Height 09/18/20 1121 5\' 1"  (1.549 m)     Head Circumference --      Peak Flow --      Pain Score 09/18/20 1121 8     Pain Loc --      Pain Edu? --      Excl. in [place]? --      Constitutional: Alert and oriented. Well appearing and in no acute distress. Eyes: Conjunctivae are normal. PERRL. EOMI. Head: Atraumatic. ENT:      Ears:       Nose: No congestion/rhinnorhea.      Mouth/Throat: Mucous membranes are moist.  Neck: No stridor.  No cervical spine tenderness to palpation.  Cardiovascular: Normal rate, regular rhythm. Normal S1 and S2.  Good peripheral circulation. Respiratory: Normal respiratory effort without tachypnea or retractions. Lungs CTAB. Good air entry to the bases with no decreased or absent breath sounds. Gastrointestinal: Bowel sounds 4 quadrants. Soft and nontender to palpation. No guarding or rigidity. No palpable masses. No distention. No CVA tenderness. Musculoskeletal: Full range of motion to all extremities. No gross deformities appreciated. Neurologic:  Normal speech and language. No gross focal neurologic deficits are appreciated.  Skin:  Skin is warm, dry and intact. No rash noted. Psychiatric: Mood and affect are normal. Speech and behavior are normal. Patient exhibits appropriate insight and judgement.   ____________________________________________   LABS (all labs ordered are  listed, but only abnormal results are displayed)  Labs Reviewed  BASIC METABOLIC PANEL - Abnormal; Notable for the following components:      Result Value   Potassium 3.4 (*)    Glucose, Bld 154 (*)    All other components within normal limits  CBC  TROPONIN I (HIGH SENSITIVITY)  TROPONIN I (HIGH SENSITIVITY)   ____________________________________________  EKG   ____________________________________________  RADIOLOGY I personally viewed and evaluated these images as part of my medical decision making, as well as reviewing the written report by the radiologist.  ED Provider Interpretation: Right-sided aortic arch previously demonstrated on imaging.  No other acute cardiopulmonary finding.  DG Chest 2 View  Result Date: 09/18/2020 CLINICAL DATA:  Chest pain. Additional provided: Left-sided chest pain which radiates into left arm 1 hour, pain described as sharp and constant. Patient reports history of "reversed  aorta." EXAM: CHEST - 2 VIEW COMPARISON:  Prior chest radiographs 10/15/2019 and earlier. Chest CT 11/29/2012. FINDINGS: Right-sided aortic arch. Heart size within normal limits. No appreciable airspace consolidation or pulmonary edema. No evidence of pleural effusion or pneumothorax. Incidentally noted azygos fissure. No acute bony abnormality identified. Surgical clips within the upper abdomen. IMPRESSION: No evidence of acute cardiopulmonary abnormality. Redemonstrated right-sided aortic arch. Electronically Signed   By: Kellie Simmering DO   On: 09/18/2020 11:55    ____________________________________________    PROCEDURES  Procedure(s) performed:    Procedures    Medications  alum & mag hydroxide-simeth (MAALOX/MYLANTA) 200-200-20 MG/5ML suspension 30 mL (has no administration in time range)    And  lidocaine (XYLOCAINE) 2 % viscous mouth solution 15 mL (has no administration in time range)  ketorolac (TORADOL) 30 MG/ML injection 30 mg (has no administration in time  range)  orphenadrine (NORFLEX) injection 60 mg (has no administration in time range)     ____________________________________________   INITIAL IMPRESSION / ASSESSMENT AND PLAN / ED COURSE  Pertinent labs & imaging results that were available during my care of the patient were reviewed by me and considered in my medical decision making (see chart for details).  Review of the [place] CSRS was performed in accordance of the [place] prior to dispensing any controlled drugs.           Patient's diagnosis is consistent with nonspecific chest pain.  Patient presented to the emergency department complaining of pain to the chest and arm.  She describes this as a tightness sensation around her chest and numbness and tingling running down her arm.  Patient states that this started after trying to get up and somewhat slipping this morning.  She has had a little bit of neck pain that is since resolved.  No loss of range of motion to the cervical spine, left upper extremity.  Overall exam is reassuring without acute finding.  Work-up is reassuring with chest x-ray being reassuring, labs and EKG are also reassuring at this time.  Follow-up with cardiology.  Return precautions discussed with the patient.  Differential included ACS/STEMI, pneumonia, bronchitis, GERD, musculoskeletal pain.  After reassuring work-up patient is given symptom control medicines to cover both musculoskeletal and GI sources of symptoms to see if this will provide symptomatic improvement for the patient.  Again follow-up with cardiology if symptoms persist.  Patient is given ED precautions to return to the ED for any worsening or new symptoms.     ____________________________________________  FINAL CLINICAL IMPRESSION(S) / ED DIAGNOSES  Final diagnoses:  Nonspecific chest pain      NEW MEDICATIONS STARTED DURING THIS VISIT:  ED Discharge Orders     None           This chart was dictated using voice recognition  software/Dragon. Despite best efforts to proofread, errors can occur which can change the meaning. Any change was purely unintentional.    Darletta Moll, PA-C 09/18/20 1545    Arta Silence, MD 09/18/20 [phone]

## 2020-09-18 NOTE — ED Triage Notes (Signed)
First Rn Note: Pt to ED via POV with c/o L arm tingling x 1 hr, chest tightness x 20 mins and HA x 20 minutes. Per [facility] staff pt reports 8/10 chest pain describes as tightness, [facility] staff reports patient diaphoretic at time of arrival to [facility]. Pt presents A&O x4, NAD at time of arrival to ED.     151/103 102HR

## 2020-09-18 NOTE — ED Notes (Signed)
Signature pad not working- pt verbalized understanding of d/c instructions

## 2021-01-09 ENCOUNTER — Emergency Department
Admission: EM | Admit: 2021-01-09 | Discharge: 2021-01-09 | Disposition: A | Discharge status: Home / Self Care | Payer: BLUE CROSS/BLUE SHIELD | Attending: Emergency Medicine | Admitting: Emergency Medicine

## 2021-01-09 ENCOUNTER — Emergency Department: Payer: BLUE CROSS/BLUE SHIELD

## 2021-01-09 ENCOUNTER — Other Ambulatory Visit: Payer: Self-pay

## 2021-01-09 DIAGNOSIS — Z85828 Personal history of other malignant neoplasm of skin: Secondary | ICD-10-CM | POA: Insufficient documentation

## 2021-01-09 DIAGNOSIS — R2243 Localized swelling, mass and lump, lower limb, bilateral: Secondary | ICD-10-CM | POA: Insufficient documentation

## 2021-01-09 DIAGNOSIS — I10 Essential (primary) hypertension: Secondary | ICD-10-CM | POA: Diagnosis not present

## 2021-01-09 DIAGNOSIS — R5383 Other fatigue: Secondary | ICD-10-CM | POA: Diagnosis not present

## 2021-01-09 DIAGNOSIS — R531 Weakness: Secondary | ICD-10-CM | POA: Diagnosis not present

## 2021-01-09 DIAGNOSIS — Z79899 Other long term (current) drug therapy: Secondary | ICD-10-CM | POA: Diagnosis not present

## 2021-01-09 DIAGNOSIS — J069 Acute upper respiratory infection, unspecified: Secondary | ICD-10-CM | POA: Diagnosis not present

## 2021-01-09 DIAGNOSIS — Z20822 Contact with and (suspected) exposure to covid-19: Secondary | ICD-10-CM | POA: Diagnosis not present

## 2021-01-09 DIAGNOSIS — R059 Cough, unspecified: Secondary | ICD-10-CM | POA: Diagnosis present

## 2021-01-09 DIAGNOSIS — J209 Acute bronchitis, unspecified: Secondary | ICD-10-CM | POA: Diagnosis not present

## 2021-01-09 LAB — CBC WITH DIFFERENTIAL/PLATELET
Abs Immature Granulocytes: 0.08 10*3/uL — ABNORMAL HIGH (ref 0.00–0.07)
Basophils Absolute: 0.1 10*3/uL (ref 0.0–0.1)
Basophils Relative: 1 %
Eosinophils Absolute: 0.1 10*3/uL (ref 0.0–0.5)
Eosinophils Relative: 1 %
HCT: 42.7 % (ref 36.0–46.0)
Hemoglobin: 14.3 g/dL (ref 12.0–15.0)
Immature Granulocytes: 1 %
Lymphocytes Relative: 23 %
Lymphs Abs: 2.3 10*3/uL (ref 0.7–4.0)
MCH: 29.4 pg (ref 26.0–34.0)
MCHC: 33.5 g/dL (ref 30.0–36.0)
MCV: 87.7 fL (ref 80.0–100.0)
Monocytes Absolute: 0.6 10*3/uL (ref 0.1–1.0)
Monocytes Relative: 6 %
Neutro Abs: 6.9 10*3/uL (ref 1.7–7.7)
Neutrophils Relative %: 68 %
Platelets: 332 10*3/uL (ref 150–400)
RBC: 4.87 MIL/uL (ref 3.87–5.11)
RDW: 13.2 % (ref 11.5–15.5)
WBC: 10 10*3/uL (ref 4.0–10.5)
nRBC: 0 % (ref 0.0–0.2)

## 2021-01-09 LAB — COMPREHENSIVE METABOLIC PANEL
ALT: 16 U/L (ref 0–44)
AST: 14 U/L — ABNORMAL LOW (ref 15–41)
Albumin: 3.8 g/dL (ref 3.5–5.0)
Alkaline Phosphatase: 31 U/L — ABNORMAL LOW (ref 38–126)
Anion gap: 5 (ref 5–15)
BUN: 14 mg/dL (ref 6–20)
CO2: 27 mmol/L (ref 22–32)
Calcium: 8.5 mg/dL — ABNORMAL LOW (ref 8.9–10.3)
Chloride: 108 mmol/L (ref 98–111)
Creatinine, Ser: 0.59 mg/dL (ref 0.44–1.00)
GFR, Estimated: >60 mL/min (ref >60)
Glucose, Bld: 119 mg/dL — ABNORMAL HIGH (ref 70–99)
Potassium: 3.8 mmol/L (ref 3.5–5.1)
Sodium: 140 mmol/L (ref 135–145)
Total Bilirubin: 0.6 mg/dL (ref 0.3–1.2)
Total Protein: 7 g/dL (ref 6.5–8.1)

## 2021-01-09 LAB — BRAIN NATRIURETIC PEPTIDE: B Natriuretic Peptide: 65 pg/mL (ref 0.0–100.0)

## 2021-01-09 LAB — RESP PANEL BY RT-PCR (FLU A&B, COVID) ARPGX2
Influenza A by PCR: NEGATIVE
Influenza B by PCR: NEGATIVE
SARS Coronavirus 2 by RT PCR: NEGATIVE

## 2021-01-09 LAB — TROPONIN I (HIGH SENSITIVITY): Troponin I (High Sensitivity): 3 ng/L (ref <18)

## 2021-01-09 IMAGING — CR DG CHEST 2V
1 series · 2 of 2 positions shown · non-contrast
Comparison: [DATE] [DATE], [DATE], [DATE] [DATE], [DATE]

CLINICAL DATA: Cough and difficulty breathing

EXAM:
CHEST - 2 VIEW

[Series 1: dg chest 2 view · 0.14mm/px · 2 of 2 slices shown]
[im 1/2]
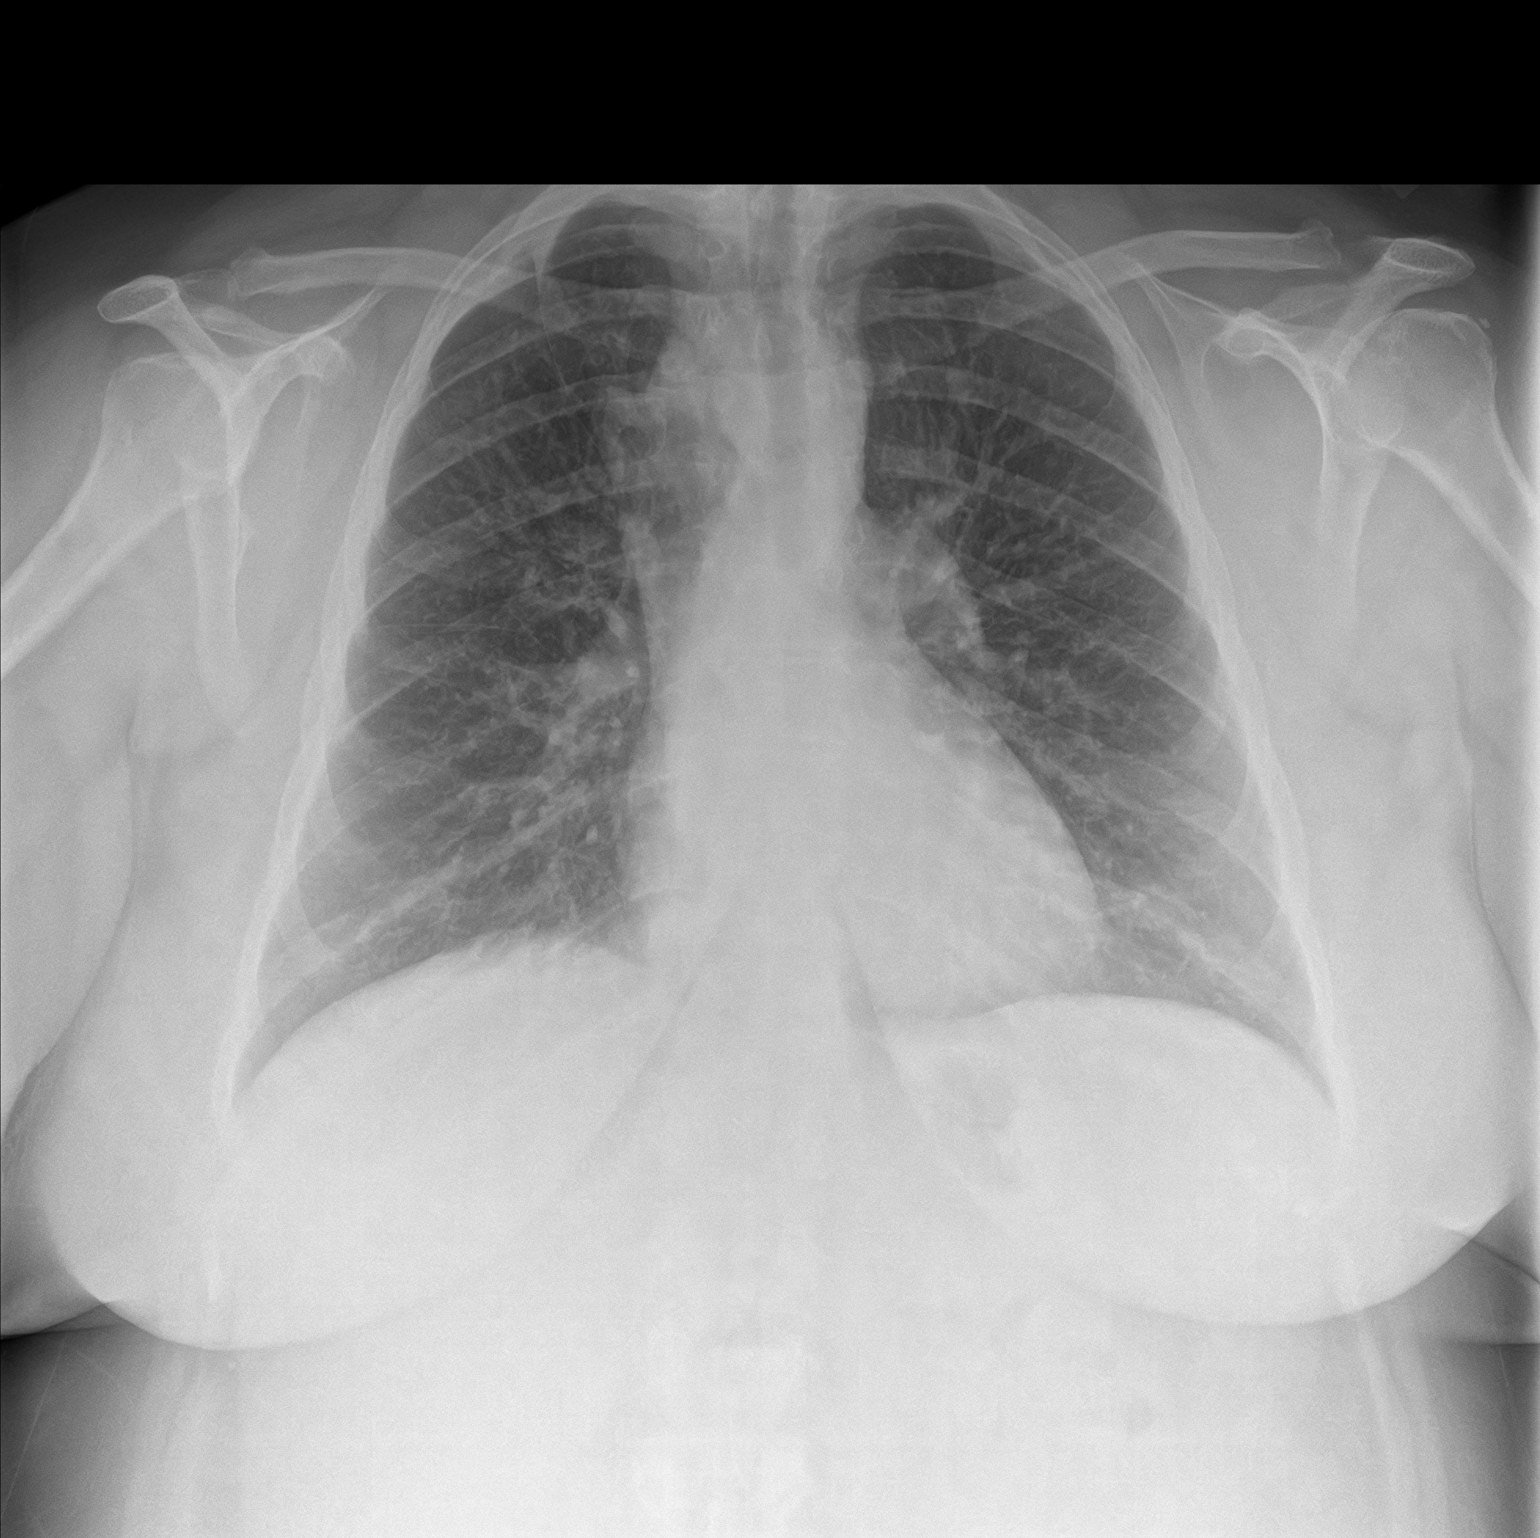
[im 2/2]
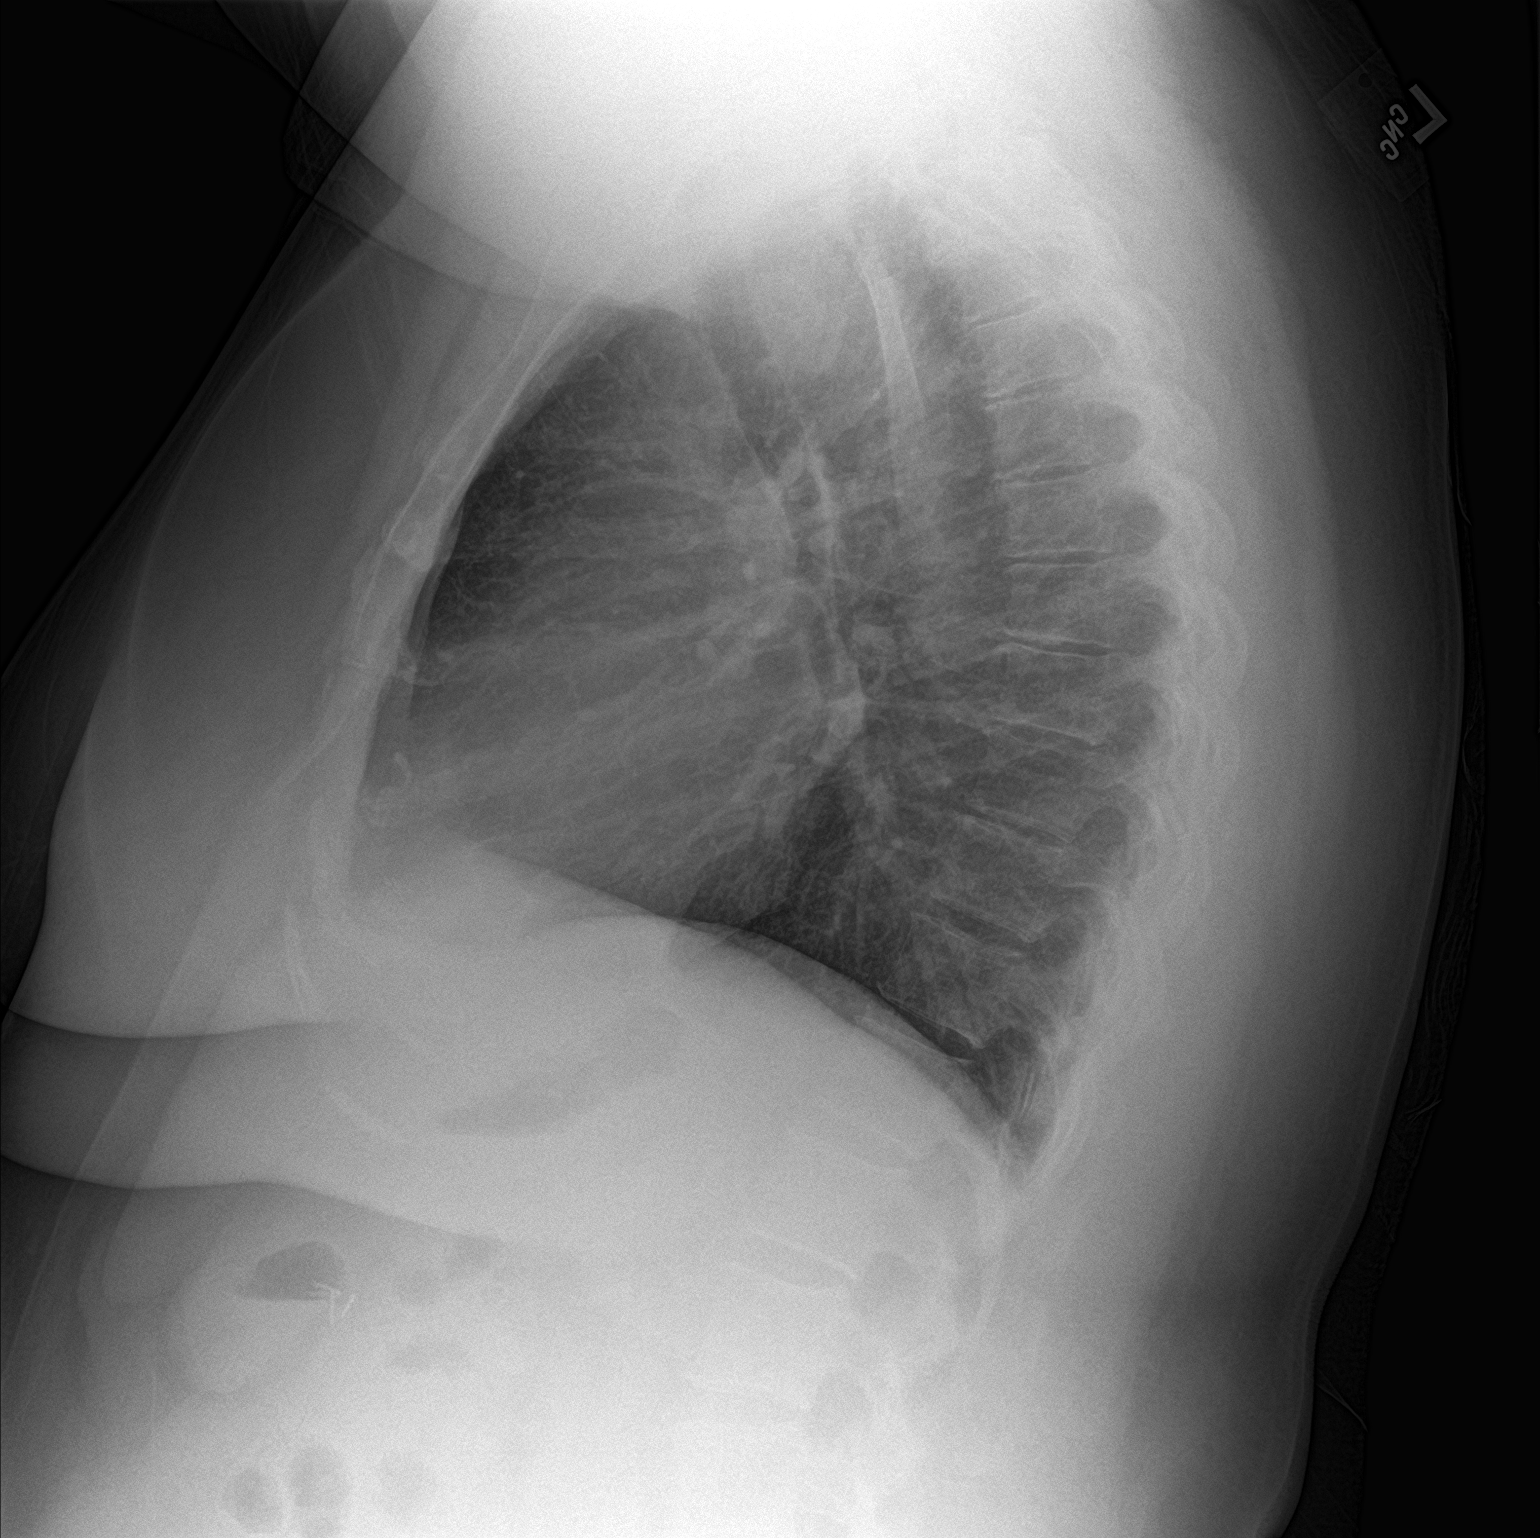

[2 of 2 positions shown; findings below may reference images not displayed]

FINDINGS: The heart size and mediastinal contours are stable. Right suprahilar
paratracheal opacities are stable since [6B]. Both lungs are clear.
The visualized skeletal structures are unremarkable.
IMPRESSION: No active cardiopulmonary disease.

## 2021-01-09 MED ORDER — AZITHROMYCIN 250 MG PO TABS: ORAL_TABLET | ORAL | 0 refills | Status: DC

## 2021-01-09 MED ORDER — ALBUTEROL SULFATE HFA 108 (90 BASE) MCG/ACT IN AERS: 2.0000 | INHALATION_SPRAY | Freq: Four times a day (QID) | RESPIRATORY_TRACT | 2 refills | Status: AC | PRN

## 2021-01-09 NOTE — Discharge Instructions (Signed)
Follow up with your regular doctor if not improving in 3 days Return to the ER if worsening

## 2021-01-09 NOTE — ED Notes (Addendum)
See triage note  presents with "not feeling well"  states cough and congestion for about 1 week   was seen by PCP had negative COVID and flu    states conts' with congestion and now having some wheezing

## 2021-01-09 NOTE — ED Provider Notes (Signed)
[facility] Emergency Department Provider Note  ____________________________________________   Event Date/Time   First MD Initiated Contact with Patient 01/09/21 [phone]     (approximate)  I have reviewed the triage vital signs and the nursing notes.   HISTORY  Chief Complaint URI    HPI Ashlee Everett is a 50 y.o. female complains of cough and congestion, fatigue, weakness.  Symptoms of been ongoing for 1 week.  Saw her regular doctor and had a negative COVID/flu/RSV test.  Patient then was put on cefdinir, prednisone, Tessalon Perles, and Tussionex.  Patient states she still feels like she is wheezing in the front of her throat.  She denies chest pain.  Has had some swelling in lower extremities.  No history of CHF  Past Medical History:  Diagnosis Date   Anginal pain ([place]) 2017   Cancer ([place]) 2016   BASAL CELL   Diverticulitis    GERD (gastroesophageal reflux disease)    Headache    History of hiatal hernia    SMALL   Hypertension    IBS (irritable bowel syndrome)    Paroxysmal SVT (supraventricular tachycardia) ([place])    Sleep apnea    USES CPAP    Patient Active Problem List   Diagnosis Date Noted   Chest pain 02/16/2015   Elevated troponin 02/16/2015   Hypertension 02/16/2015   Paroxysmal SVT (supraventricular tachycardia) ([place]) 02/16/2015    Past Surgical History:  Procedure Laterality Date   ABDOMINAL HYSTERECTOMY     APPENDECTOMY     CHOLECYSTECTOMY N/A 06/21/2017   Procedure: LAPAROSCOPIC CHOLECYSTECTOMY;  Surgeon: Herbert Pun, MD;  Location: [facility] ORS;  Service: General;  Laterality: N/A;   ESOPHAGOGASTRODUODENOSCOPY (EGD) WITH PROPOFOL N/A 06/11/2017   Procedure: ESOPHAGOGASTRODUODENOSCOPY (EGD) WITH PROPOFOL;  Surgeon: Lollie Sails, MD;  Location: [facility] ENDOSCOPY;  Service: Endoscopy;  Laterality: N/A;   LAPAROSCOPIC LYSIS OF ADHESIONS     SALPINGOOPHORECTOMY Left    SIGMOID RESECTION / RECTOPEXY     TUBAL  LIGATION      Prior to Admission medications   Medication Sig Start Date End Date Taking? Authorizing Provider  albuterol (VENTOLIN HFA) 108 (90 Base) MCG/ACT inhaler Inhale 2 puffs into the lungs every 6 (six) hours as needed for wheezing or shortness of breath. 01/09/21  Yes Doranne Schmutz, Linden Dolin, PA-C  azithromycin (ZITHROMAX Z-PAK) 250 MG tablet 2 pills today then 1 pill a day for 4 days 01/09/21  Yes Jalayne Ganesh, Linden Dolin, PA-C  acetaminophen (TYLENOL) 500 MG tablet Take 500 mg by mouth every 6 (six) hours as needed (for pain.).    [provider]  fluticasone (FLONASE) 50 MCG/ACT nasal spray Place 1 spray into both nostrils daily. 03/09/19 03/08/20  Lannie Fields, PA-C  ibuprofen (ADVIL,MOTRIN) 200 MG tablet Take 400-600 mg by mouth every 8 (eight) hours as needed for mild pain (for pain.).     [provider]  metoprolol tartrate (LOPRESSOR) 25 MG tablet Take 25 mg by mouth 2 (two) times daily.    [provider]  pantoprazole (PROTONIX) 40 MG tablet Take 40 mg by mouth daily before breakfast.    [provider]    Allergies Fioricet [butalbital-apap-caffeine] and Other  Family History  Problem Relation Age of Onset   Hypertension Mother    Diabetes Mother    Lung cancer Father    Heart attack Father     Social History Social History   Tobacco Use   Smoking status: Never   Smokeless tobacco:  Never  Vaping Use   Vaping Use: Never used  Substance Use Topics   Alcohol use: No    Alcohol/week: 0.0 standard drinks   Drug use: No    Review of Systems  Constitutional: No fever/chills Eyes: No visual changes. ENT: No sore throat. Respiratory: Positive cough Cardiovascular: Denies chest pain Gastrointestinal: Denies abdominal pain Genitourinary: Negative for dysuria. Musculoskeletal: Negative for back pain. Skin: Negative for rash. Psychiatric: no mood changes,     ____________________________________________   PHYSICAL EXAM:  VITAL  SIGNS: ED Triage Vitals  Enc Vitals Group     BP 01/09/21 0924 (!) 182/89     Pulse Rate 01/09/21 0924 75     Resp 01/09/21 0924 20     Temp 01/09/21 0924 98 F (36.7 C)     Temp Source 01/09/21 0924 Oral     SpO2 01/09/21 0924 95 %     Weight 01/09/21 0925 249 lb 1.9 oz (113 kg)     Height 01/09/21 0925 5\' 1"  (1.549 m)     Head Circumference --      Peak Flow --      Pain Score 01/09/21 0915 7     Pain Loc --      Pain Edu? --      Excl. in [place]? --     Constitutional: Alert and oriented. Well appearing and in no acute distress. Eyes: Conjunctivae are normal.  Head: Atraumatic. Nose: No congestion/rhinnorhea. Mouth/Throat: Mucous membranes are moist.   Neck:  supple no lymphadenopathy noted, jvd noted on the right Cardiovascular: Normal rate, regular rhythm. Heart sounds are normal Respiratory: Normal respiratory effort.  No retractions, lungs c t a  Abd: soft nontender bs normal all 4 quad GU: deferred Musculoskeletal: FROM all extremities, warm and well perfused Neurologic:  Normal speech and language.  Skin:  Skin is warm, dry and intact. No rash noted. Psychiatric: Mood and affect are normal. Speech and behavior are normal.  ____________________________________________   LABS (all labs ordered are listed, but only abnormal results are displayed)  Labs Reviewed  COMPREHENSIVE METABOLIC PANEL - Abnormal; Notable for the following components:      Result Value   Glucose, Bld 119 (*)    Calcium 8.5 (*)    AST 14 (*)    Alkaline Phosphatase 31 (*)    All other components within normal limits  CBC WITH DIFFERENTIAL/PLATELET - Abnormal; Notable for the following components:   Abs Immature Granulocytes 0.08 (*)    All other components within normal limits  RESP PANEL BY RT-PCR (FLU A&B, COVID) ARPGX2  BRAIN NATRIURETIC PEPTIDE  TROPONIN I (HIGH SENSITIVITY)  TROPONIN I (HIGH SENSITIVITY)    ____________________________________________   ____________________________________________  RADIOLOGY  Chest x-ray  ____________________________________________   PROCEDURES  Procedure(s) performed: No  Procedures    ____________________________________________   INITIAL IMPRESSION / ASSESSMENT AND PLAN / ED COURSE  Pertinent labs & imaging results that were available during my care of the patient were reviewed by me and considered in my medical decision making (see chart for details).   Patient is a 50 year old female presents with fatigue and URI symptoms.  See HPI.  Physical exam shows patient be stable at this time.  JVD was noted on exam.  No pitting edema noted in the distal extremities  DDx: Acute bronchitis, covid, CHF, CAP  Labs and imaging along with EKG ordered  Labs and imaging are reassuring.  CBC, metabolic panel, troponin, BNP are all normal  Chest x-ray reviewed  by me confirmed by radiology is negative for pneumonia  EKG shows normal sinus rhythm, see physician read  I did discuss all findings with patient.  She states she did not get the omnicef filled.  We will send in a prescription for zpack and albuterol inhaler.  She is not to get the rx for omnicef filled.  REturn to the ER if worsening, f/u with her regular doctor if not improving in 3 days. Discharged in stable condition      Ashlee Everett was evaluated in Emergency Department on 01/09/2021 for the symptoms described in the history of present illness. She was evaluated in the context of the global COVID-19 pandemic, which necessitated consideration that the patient might be at risk for infection with the SARS-CoV-2 virus that causes COVID-19. Institutional protocols and algorithms that pertain to the evaluation of patients at risk for COVID-19 are in a state of rapid change based on information released by regulatory bodies including the CDC and federal and state organizations. These  policies and algorithms were followed during the patient's care in the ED.    As part of my medical decision making, I reviewed the following data within the [facility] notes reviewed and incorporated, Labs reviewed , EKG interpreted NSR, Old chart reviewed, Radiograph reviewed , Notes from prior ED visits, and [place] Controlled Substance Database  ____________________________________________   FINAL CLINICAL IMPRESSION(S) / ED DIAGNOSES  Final diagnoses:  Acute bronchitis, unspecified organism  Other fatigue      NEW MEDICATIONS STARTED DURING THIS VISIT:  New Prescriptions   ALBUTEROL (VENTOLIN HFA) 108 (90 BASE) MCG/ACT INHALER    Inhale 2 puffs into the lungs every 6 (six) hours as needed for wheezing or shortness of breath.   AZITHROMYCIN (ZITHROMAX Z-PAK) 250 MG TABLET    2 pills today then 1 pill a day for 4 days     Note:  This document was prepared using Dragon voice recognition software and may include unintentional dictation errors.    Versie Starks, PA-C 01/09/21 1201    Arta Silence, MD 01/09/21 [phone]

## 2021-01-09 NOTE — ED Triage Notes (Signed)
Pt c/o cough with congestion since last Monday , states she saw here PCP on Wednesday and was Rx cough meds and prednisone with no relief. Pt is in NAD, tested negative for covid

## 2021-05-01 ENCOUNTER — Encounter: Payer: Self-pay | Admitting: Emergency Medicine

## 2021-05-01 ENCOUNTER — Emergency Department: Payer: 59

## 2021-05-01 ENCOUNTER — Emergency Department
Admission: EM | Admit: 2021-05-01 | Discharge: 2021-05-01 | Disposition: A | Discharge status: Home / Self Care | Payer: 59 | Attending: Emergency Medicine | Admitting: Emergency Medicine

## 2021-05-01 ENCOUNTER — Other Ambulatory Visit: Payer: Self-pay

## 2021-05-01 DIAGNOSIS — R42 Dizziness and giddiness: Secondary | ICD-10-CM | POA: Insufficient documentation

## 2021-05-01 DIAGNOSIS — I10 Essential (primary) hypertension: Secondary | ICD-10-CM | POA: Diagnosis not present

## 2021-05-01 DIAGNOSIS — M5021 Other cervical disc displacement,  high cervical region: Secondary | ICD-10-CM | POA: Diagnosis not present

## 2021-05-01 DIAGNOSIS — J341 Cyst and mucocele of nose and nasal sinus: Secondary | ICD-10-CM | POA: Diagnosis not present

## 2021-05-01 DIAGNOSIS — R509 Fever, unspecified: Secondary | ICD-10-CM | POA: Diagnosis not present

## 2021-05-01 DIAGNOSIS — R519 Headache, unspecified: Secondary | ICD-10-CM | POA: Diagnosis not present

## 2021-05-01 DIAGNOSIS — R202 Paresthesia of skin: Secondary | ICD-10-CM | POA: Diagnosis not present

## 2021-05-01 DIAGNOSIS — R0789 Other chest pain: Secondary | ICD-10-CM | POA: Diagnosis not present

## 2021-05-01 DIAGNOSIS — M47812 Spondylosis without myelopathy or radiculopathy, cervical region: Secondary | ICD-10-CM | POA: Diagnosis not present

## 2021-05-01 DIAGNOSIS — M50223 Other cervical disc displacement at C6-C7 level: Secondary | ICD-10-CM | POA: Diagnosis not present

## 2021-05-01 DIAGNOSIS — R079 Chest pain, unspecified: Secondary | ICD-10-CM | POA: Diagnosis not present

## 2021-05-01 LAB — BASIC METABOLIC PANEL
Anion gap: 6 (ref 5–15)
BUN: 18 mg/dL (ref 6–20)
CO2: 26 mmol/L (ref 22–32)
Calcium: 8.6 mg/dL — ABNORMAL LOW (ref 8.9–10.3)
Chloride: 106 mmol/L (ref 98–111)
Creatinine, Ser: 0.56 mg/dL (ref 0.44–1.00)
GFR, Estimated: >60 mL/min (ref >60)
Glucose, Bld: 141 mg/dL — ABNORMAL HIGH (ref 70–99)
Potassium: 3.7 mmol/L (ref 3.5–5.1)
Sodium: 138 mmol/L (ref 135–145)

## 2021-05-01 LAB — CBC
HCT: 41.9 % (ref 36.0–46.0)
Hemoglobin: 13.5 g/dL (ref 12.0–15.0)
MCH: 29.2 pg (ref 26.0–34.0)
MCHC: 32.2 g/dL (ref 30.0–36.0)
MCV: 90.5 fL (ref 80.0–100.0)
Platelets: 337 10*3/uL (ref 150–400)
RBC: 4.63 MIL/uL (ref 3.87–5.11)
RDW: 13.2 % (ref 11.5–15.5)
WBC: 7.6 10*3/uL (ref 4.0–10.5)
nRBC: 0 % (ref 0.0–0.2)

## 2021-05-01 LAB — TROPONIN I (HIGH SENSITIVITY)
Troponin I (High Sensitivity): <2 ng/L (ref <18)
Troponin I (High Sensitivity): 2 ng/L (ref <18)

## 2021-05-01 LAB — LIPASE, BLOOD: Lipase: 26 U/L (ref 11–51)

## 2021-05-01 LAB — HEPATIC FUNCTION PANEL
ALT: 11 U/L (ref 0–44)
AST: 14 U/L — ABNORMAL LOW (ref 15–41)
Albumin: 3.5 g/dL (ref 3.5–5.0)
Alkaline Phosphatase: 38 U/L (ref 38–126)
Bilirubin, Direct: 0.1 mg/dL (ref 0.0–0.2)
Indirect Bilirubin: 0.6 mg/dL (ref 0.3–0.9)
Total Bilirubin: 0.7 mg/dL (ref 0.3–1.2)
Total Protein: 6.8 g/dL (ref 6.5–8.1)

## 2021-05-01 IMAGING — CR DG CHEST 2V
1 series · 2 of 2 positions shown · non-contrast
Comparison: [DATE]

CLINICAL DATA: Chest pain

EXAM:
CHEST - 2 VIEW

[Series 1: dg chest 2 view · 0.14mm/px · 2 of 2 slices shown]
[im 1/2]
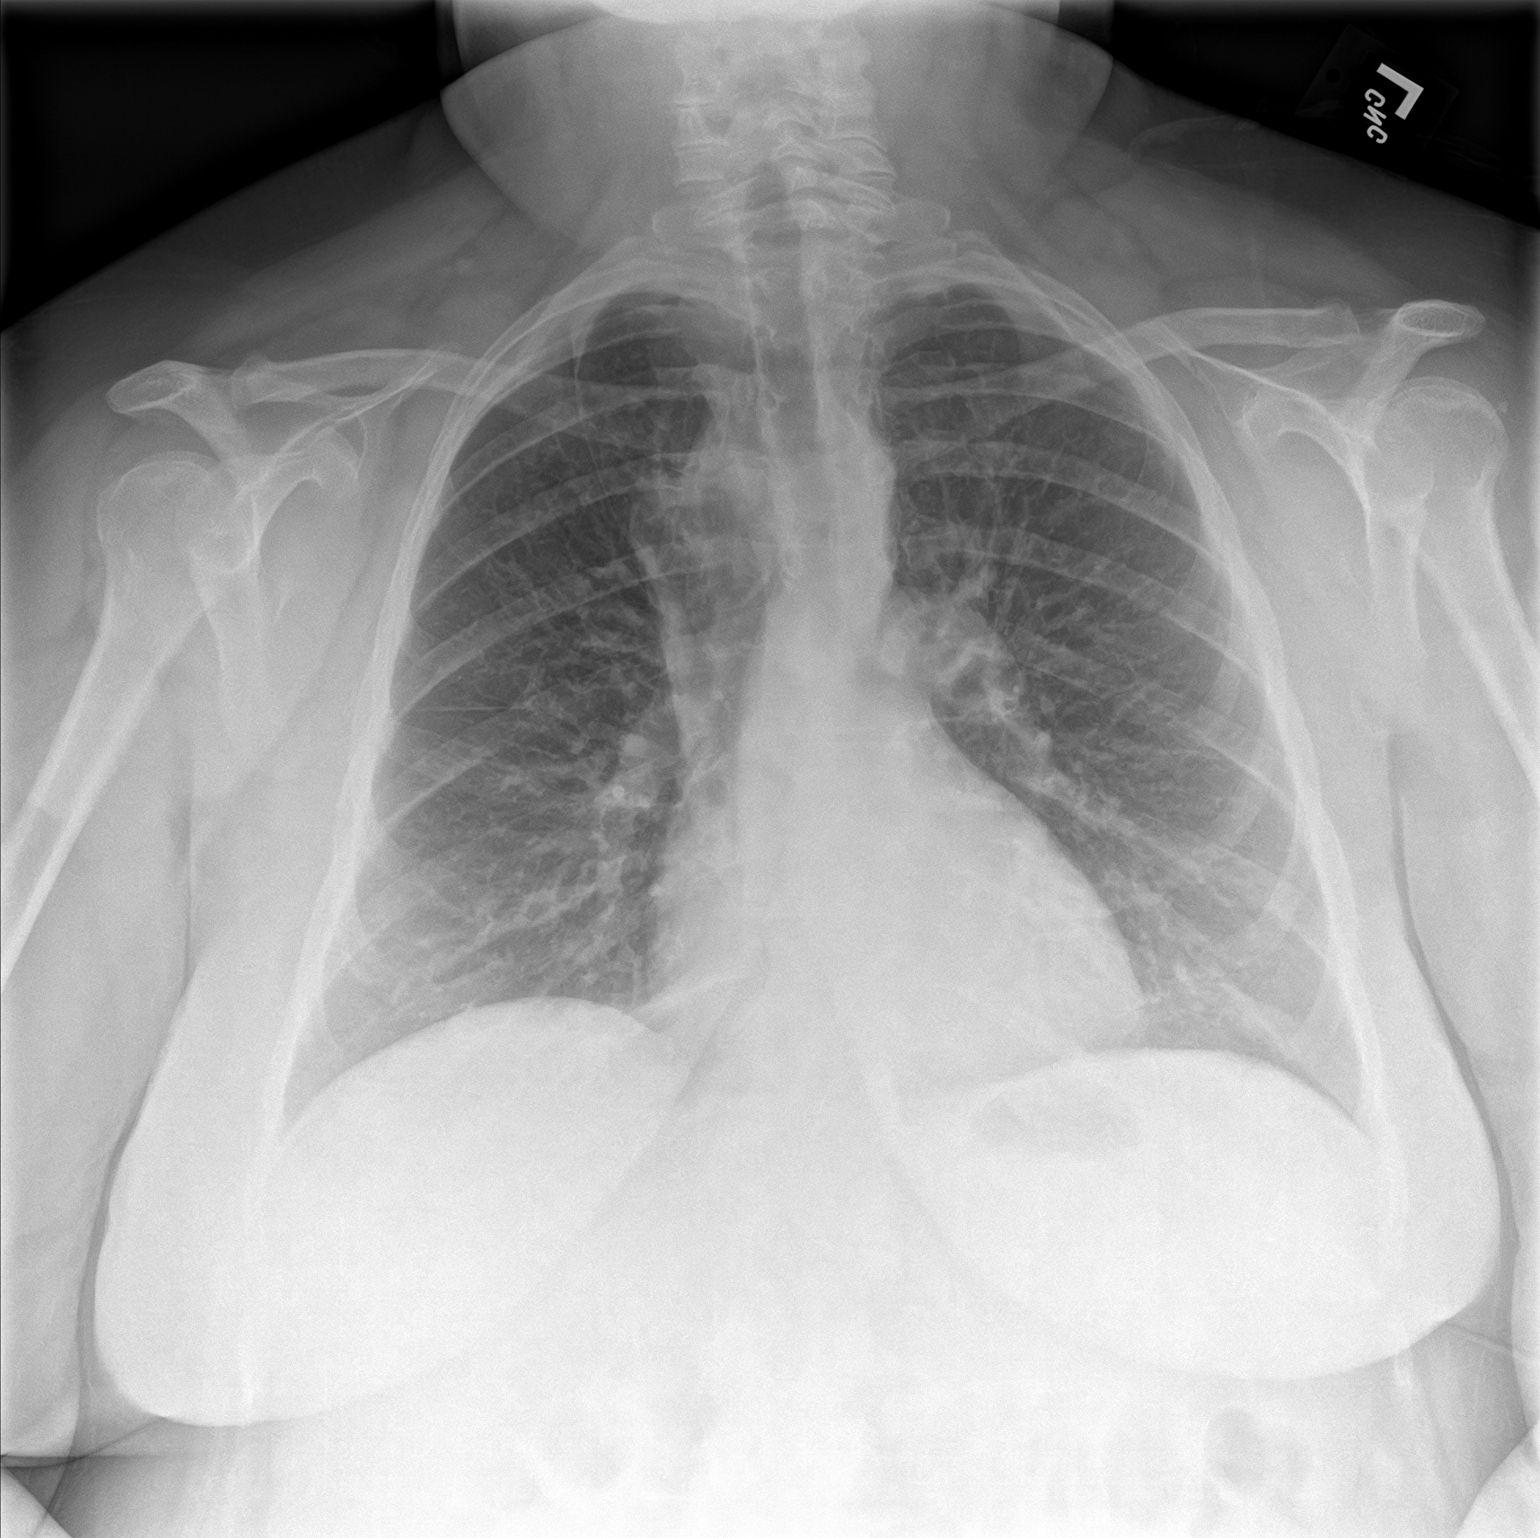
[im 2/2]
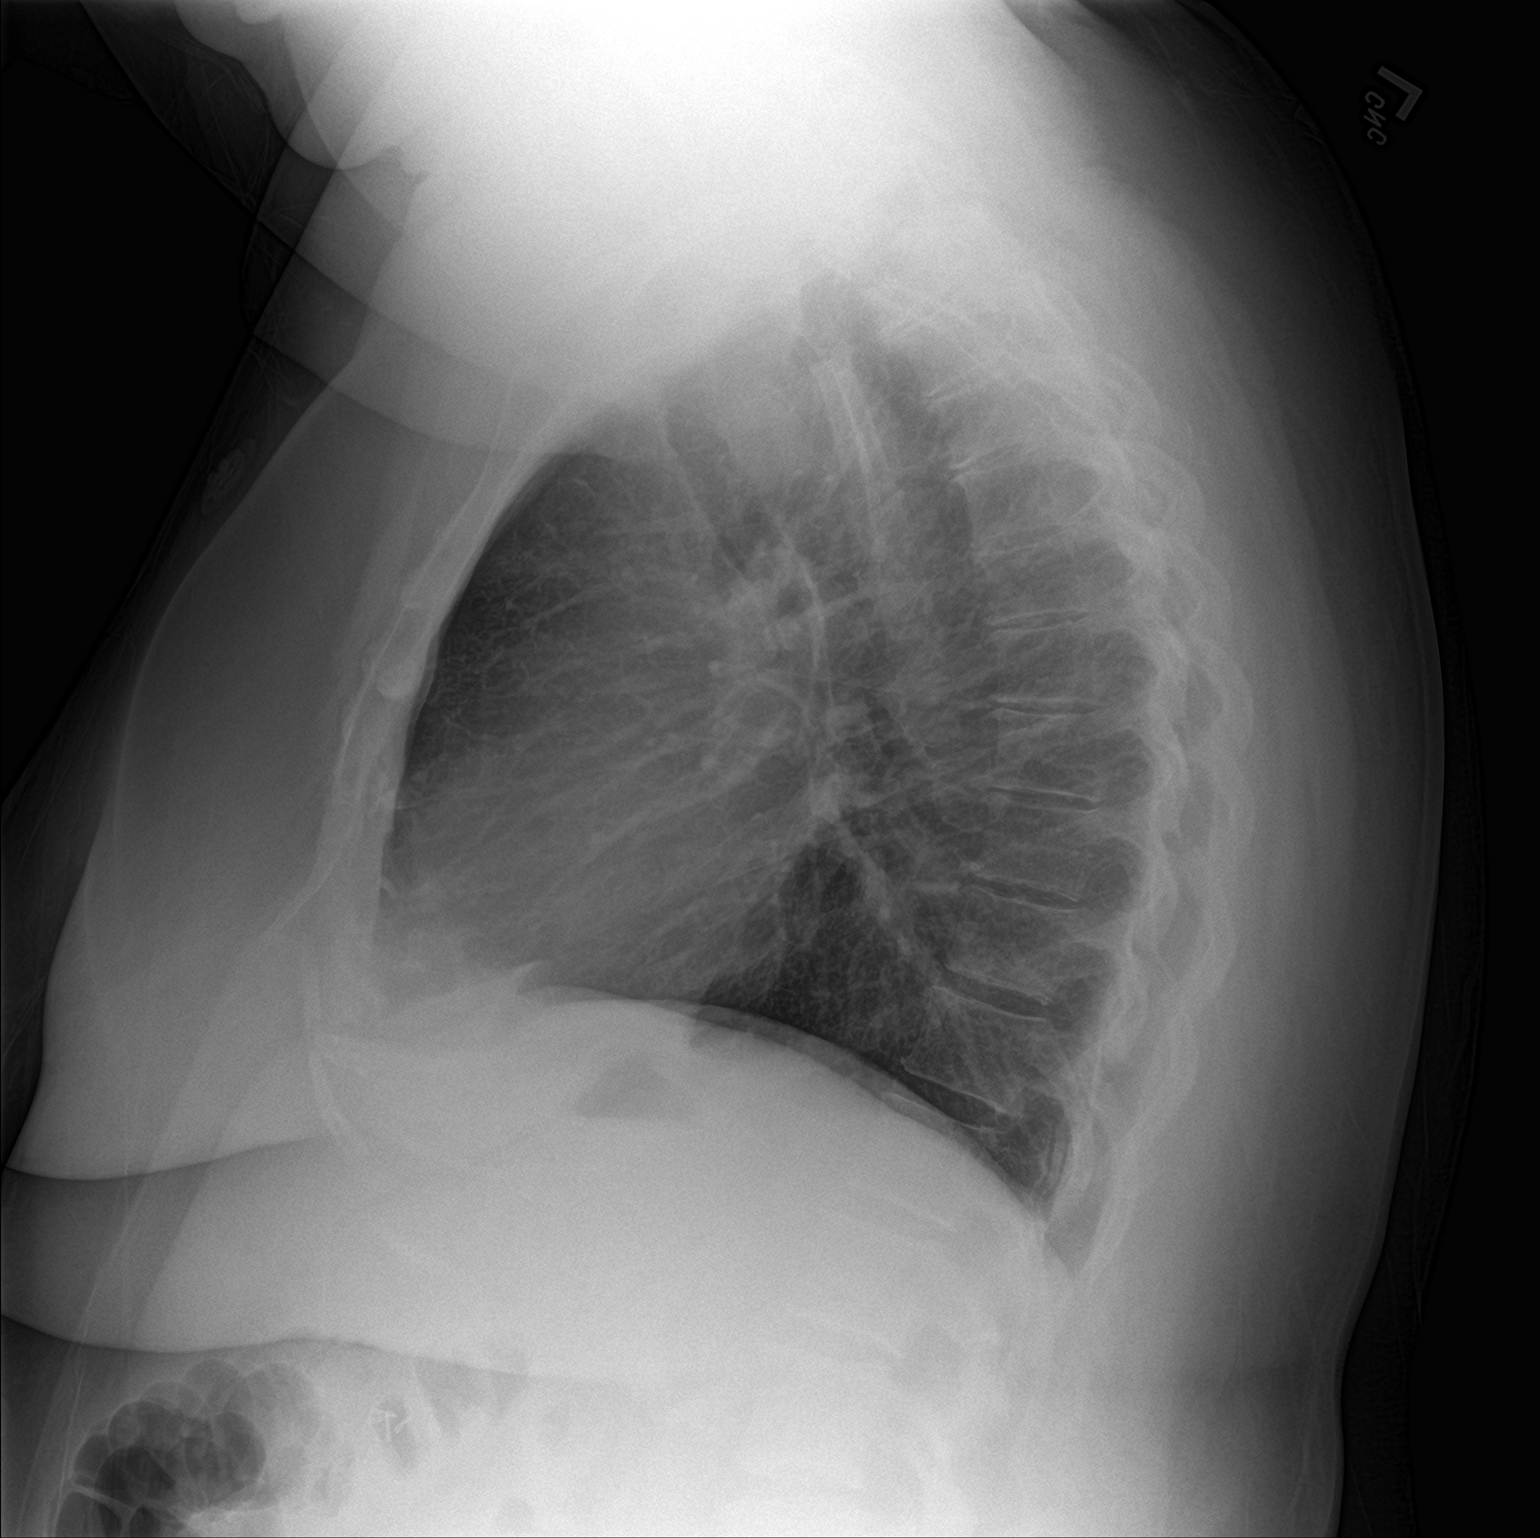

[2 of 2 positions shown; findings below may reference images not displayed]

FINDINGS: Widening of the upper right mediastinum which is from right aortic
arch by prior CT, with azygous fissure. There is no edema,
consolidation, effusion, or pneumothorax. Normal heart size. No
acute osseous finding.
IMPRESSION: No evidence of acute disease.

## 2021-05-01 IMAGING — MR MR HEAD W/O CM
13 series · 48 of 48 positions shown · non-contrast
Comparison: Head CT from earlier today.

CLINICAL DATA: Headache, new or worsening. Pain extends into the
right arm.

EXAM:
MRI HEAD WITHOUT CONTRAST
MRI CERVICAL SPINE WITHOUT CONTRAST
TECHNIQUE: Multiplanar, multiecho pulse sequences of the brain and surrounding
structures, and cervical spine, to include the craniocervical
junction and cervicothoracic junction, were obtained without
intravenous contrast.

[Series 5: ax dwi_tracew · axial · 3.0mm · 0.71mm/px · z∈[-65,+92]mm · 3 of 56 slices shown]
[im 1/56]
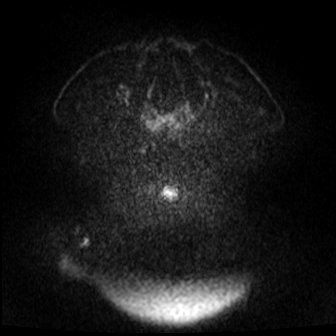
[im 28/56]
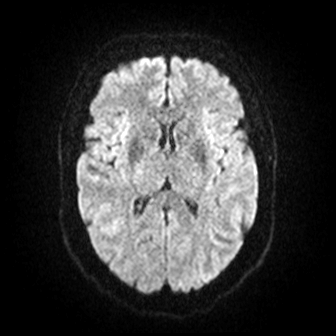
[im 56/56]
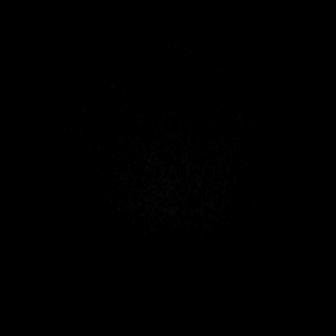

[Series 6: ax dwi_adc · axial · 3.0mm · 0.71mm/px · z∈[-65,+89]mm · 3 of 55 slices shown]
[im 1/55]
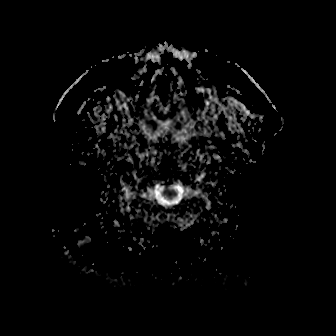
[im 28/55]
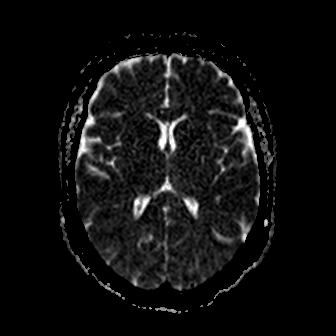
[im 55/55]
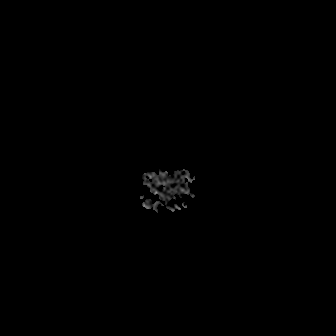

[Series 7: cor dwi_tracew · coronal · 5.0mm · 0.68mm/px · 2 of 40 slices shown]
[im 1/40]
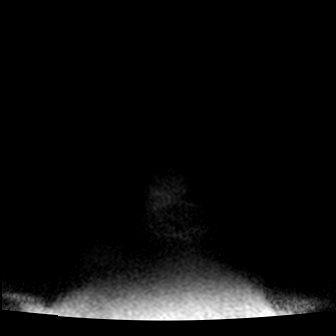
[im 40/40]
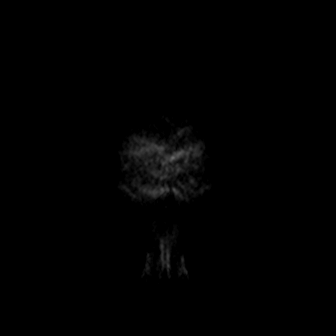

[Series 8: cor dwi_adc · coronal · 5.0mm · 0.68mm/px · 3 of 40 slices shown]
[im 1/40]
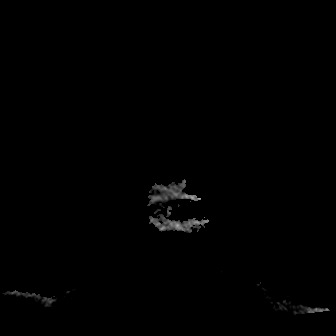
[im 20/40]
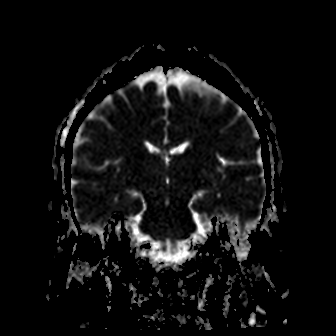
[im 40/40]
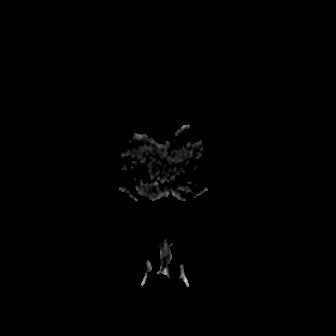

[Series 9: T1 · sagittal · 5.0mm · 0.47mm/px · 2 of 24 slices shown (1 of 2)]
[im 1/24]
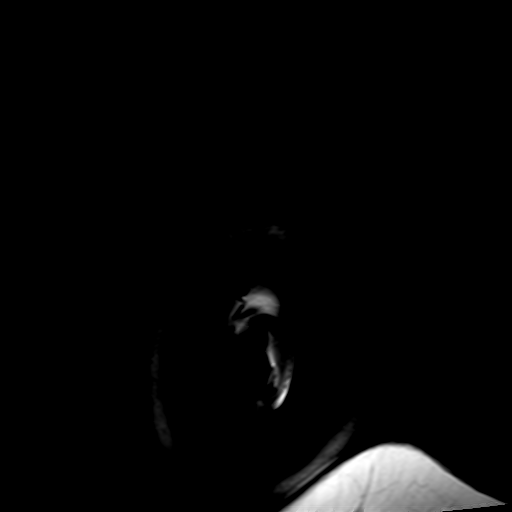
[im 24/24]
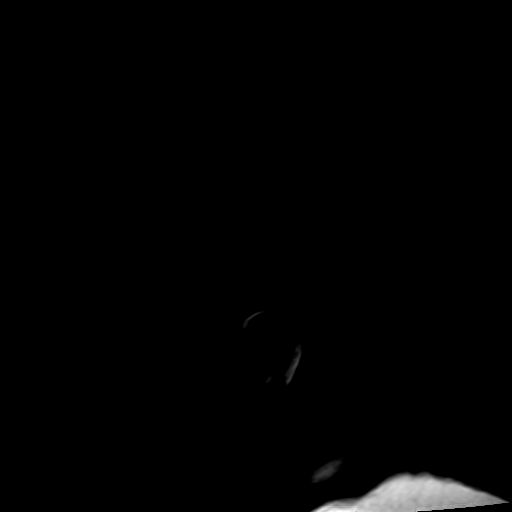

[Series 10: T2 · axial · 5.0mm · 0.86mm/px · z∈[-56,+80]mm · 2 of 25 slices shown (1 of 2)]
[im 1/25]
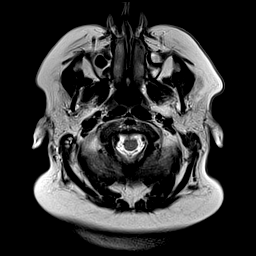
[im 25/25]
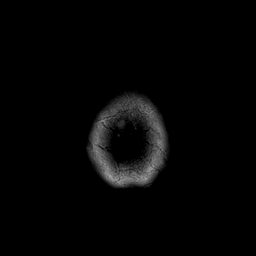

[Series 11: mag_images · axial · 3.0mm · 0.90mm/px · z∈[-58,+86]mm · 4 of 52 slices shown]
[im 1/52]
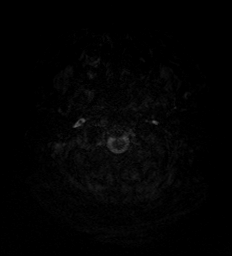
[im 18/52]
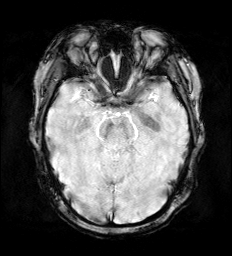
[im 35/52]
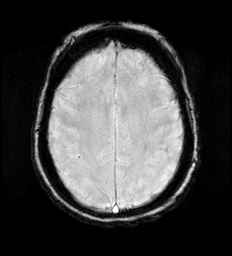
[im 52/52]
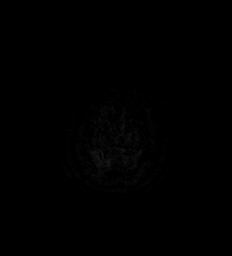

[Series 12: pha_images · axial · 3.0mm · 0.90mm/px · z∈[-58,+86]mm · 4 of 52 slices shown]
[im 1/52]
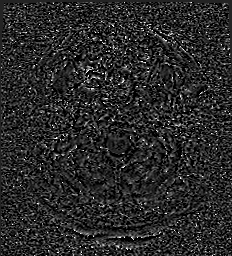
[im 18/52]
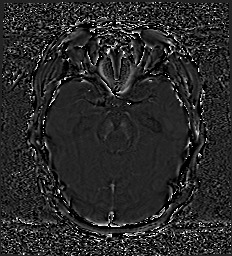
[im 35/52]
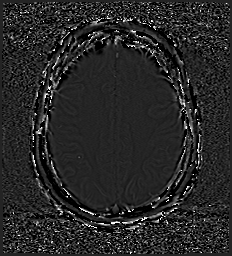
[im 52/52]
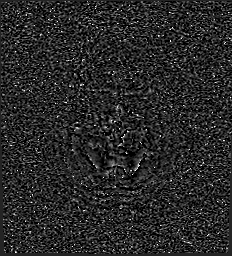

[Series 13: swi_images · axial · 3.0mm · 0.90mm/px · z∈[-58,+86]mm · 4 of 52 slices shown]
[im 1/52]
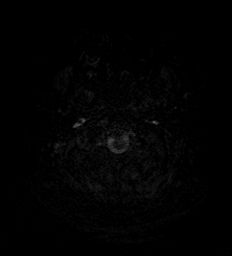
[im 18/52]
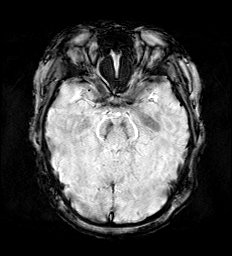
[im 35/52]
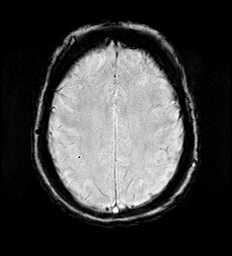
[im 52/52]
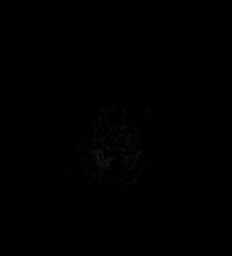

[Series 14: mip_images(sw) · axial · 24.0mm · 0.90mm/px · z∈[-48,+76]mm · 3 of 45 slices shown]
[im 1/45]
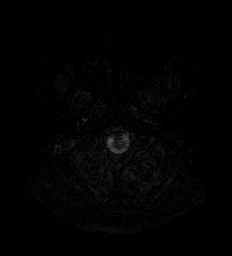
[im 23/45]
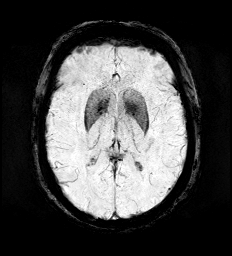
[im 45/45]
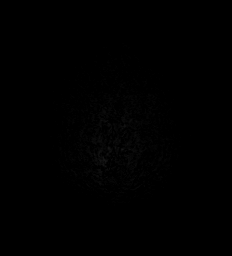

[Series 15: FLAIR · axial · 3.0mm · 0.69mm/px · z∈[-64,+89]mm · 4 of 55 slices shown]
[im 1/55]
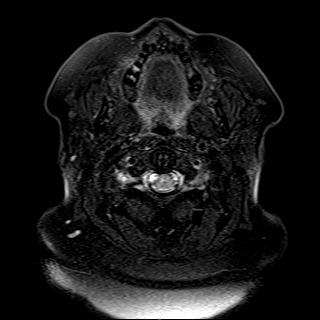
[im 19/55]
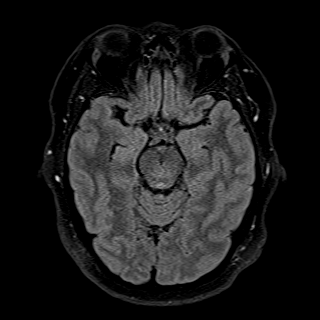
[im 37/55]
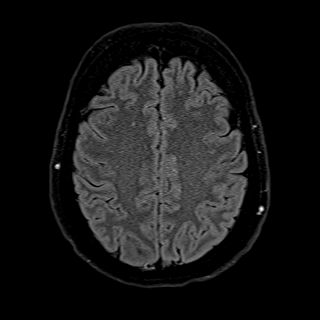
[im 55/55]
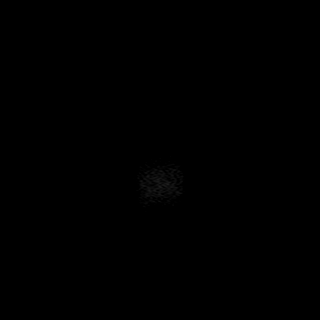

[Series 16: T1 · axial · 1.0mm · 0.98mm/px · z∈[-68,+97]mm · 12 of 176 slices shown (2 of 2)]
[im 1/176]
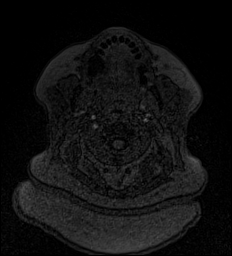
[im 16/176]
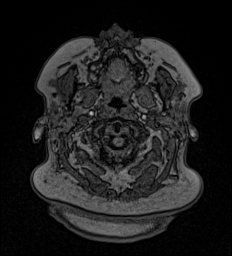
[im 32/176]
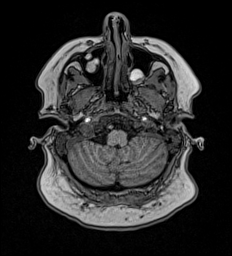
[im 48/176]
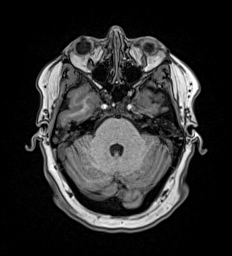
[im 64/176]
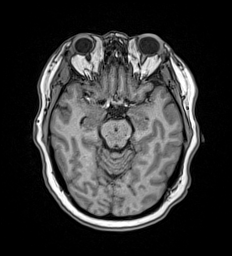
[im 80/176]
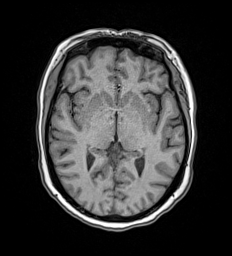
[im 96/176]
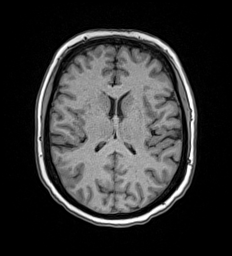
[im 112/176]
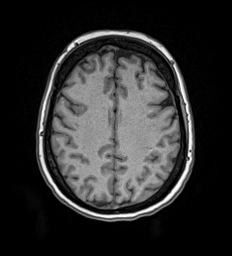
[im 128/176]
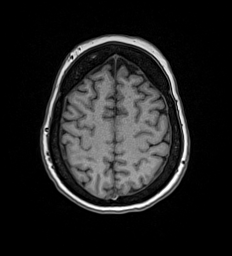
[im 144/176]
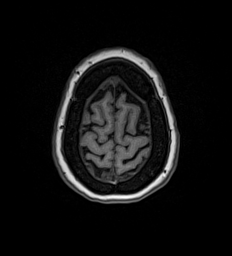
[im 160/176]
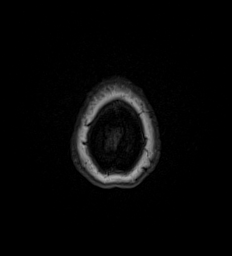
[im 176/176]
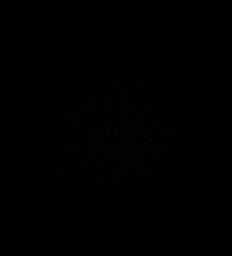

[Series 17: T2 · coronal · 5.0mm · 0.86mm/px · 2 of 30 slices shown (2 of 2)]
[im 1/30]
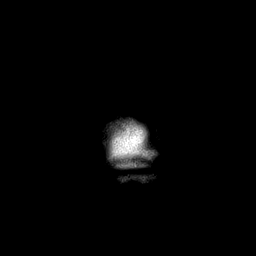
[im 30/30]
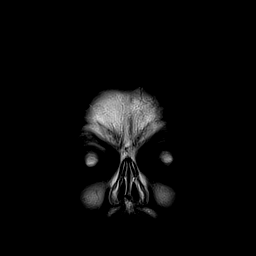

[48 of 48 positions shown; findings below may reference images not displayed]

FINDINGS: MRI HEAD FINDINGS

Brain: No acute infarction, hemorrhage, hydrocephalus, extra-axial
collection or mass lesion. Few remote white matter insults with
nonspecific pattern, history of hypertension and headache favoring
chronic small vessel ischemia. Brain volume is normal.

Vascular: Normal flow voids.

Skull and upper cervical spine: Normal marrow signal

Sinuses/Orbits: Proteinaceous retention cysts in the bilateral
maxillary sinus. Bilateral mastoid opacification which is chronic
based on [Z7] head CT.

MRI CERVICAL SPINE FINDINGS

Alignment: Physiologic.

Vertebrae: No fracture, evidence of discitis, or bone lesion.

Cord: Normal signal and morphology.

Posterior Fossa, vertebral arteries, paraspinal tissues: Negative.

Disc levels:

C2-3: Minor disc bulging and uncovertebral spurring

C3-4: Minor facet and uncovertebral spurring.

C4-5: Mild facet spurring

C5-6: Small central disc protrusion and mild facet spurring.

C6-7: Small central disc protrusion.

C7-T1:Unremarkable.
IMPRESSION: Brain MRI:

1. No acute finding or explanation for symptoms.
2. Chronic bilateral mastoid opacification, present since at least
the [Z7] head CT

Cervical MRI:

No acute finding or neural impingement. Early degenerative changes
are described above.

## 2021-05-01 IMAGING — CT CT HEAD W/O CM
4 series · 16 of 47 positions shown, 18 images · non-contrast
Comparison: [DATE]

CLINICAL DATA: New or worsening headache.



[Series 2: head wo · axial · 0.45mm/px · z∈[-156,-16]mm · 7 of 38 slices shown, 9 images]
[im 5/38  brain]
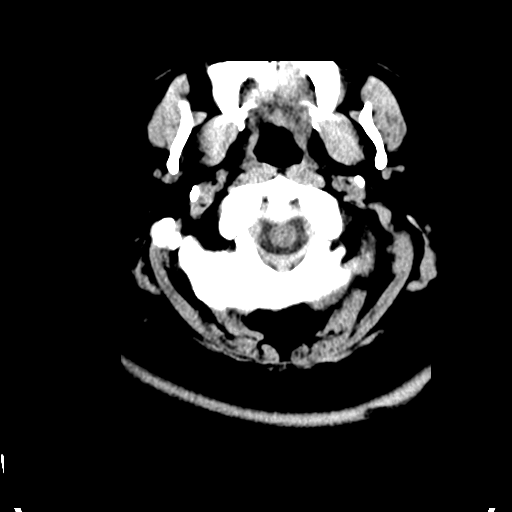
[im 5/38  bone]
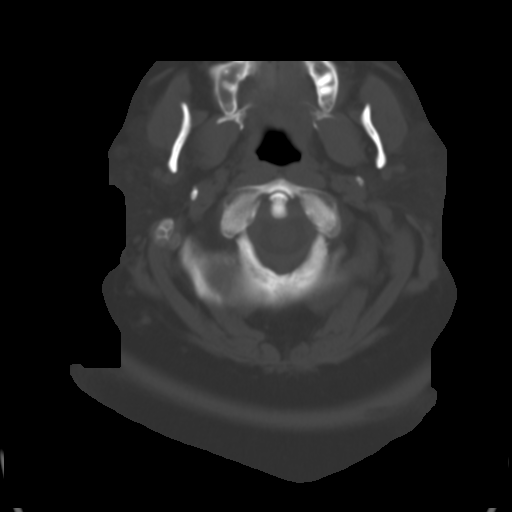
[im 10/38  brain]
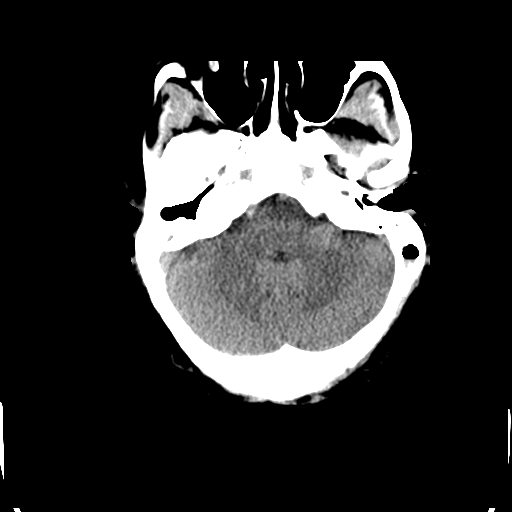
[im 14/38  brain]
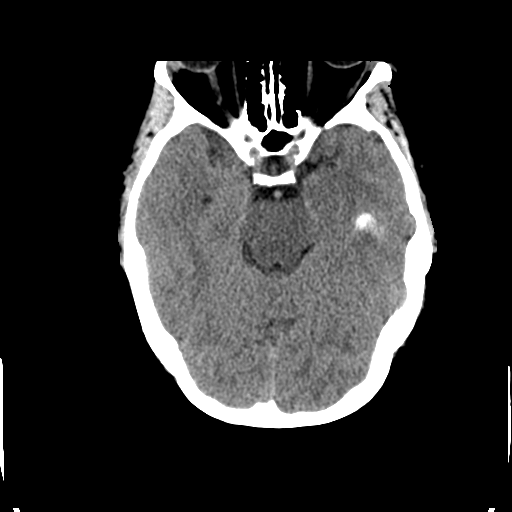
[im 19/38  brain]
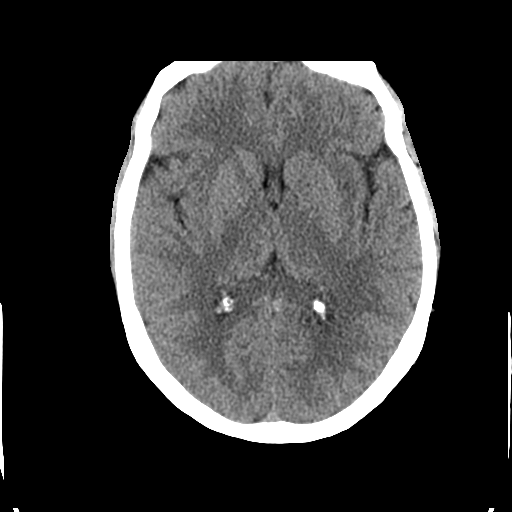
[im 24/38  brain]
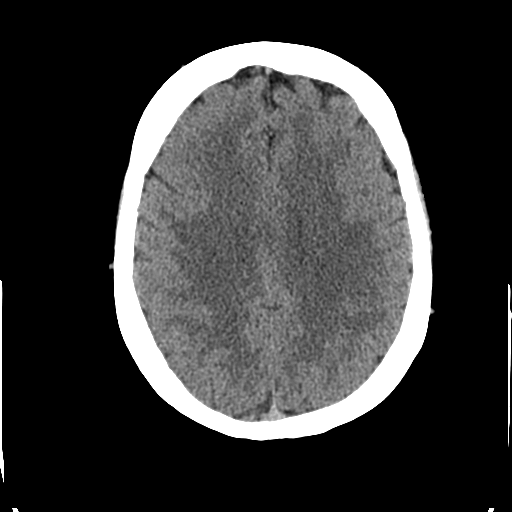
[im 24/38  bone]
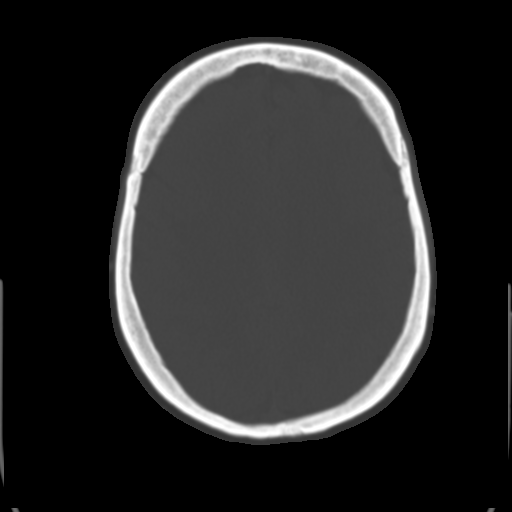
[im 28/38  brain]
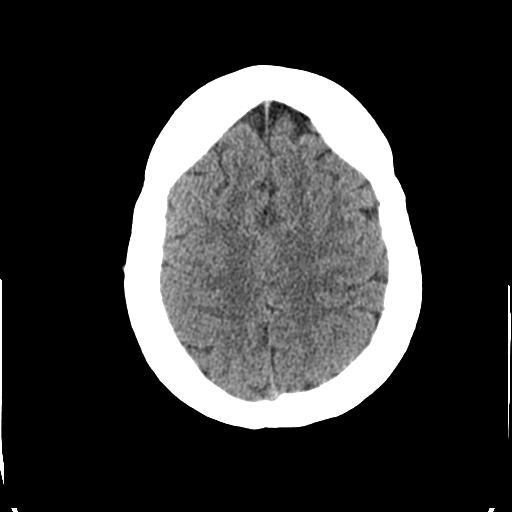
[im 33/38  brain]
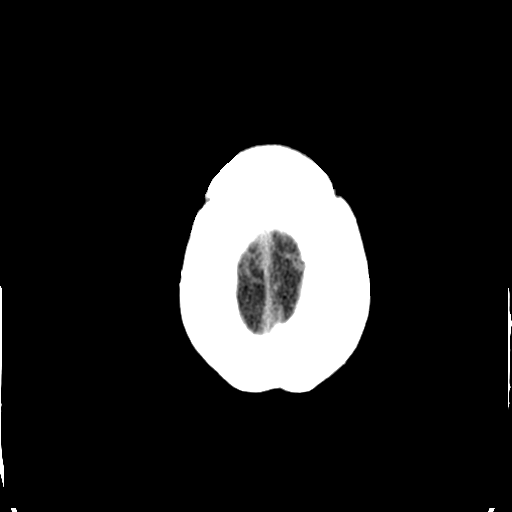

[Series 3: head bone · axial · 0.45mm/px · z∈[-158,-120]mm · 3 of 95 slices shown]
[im 10/95  bone]
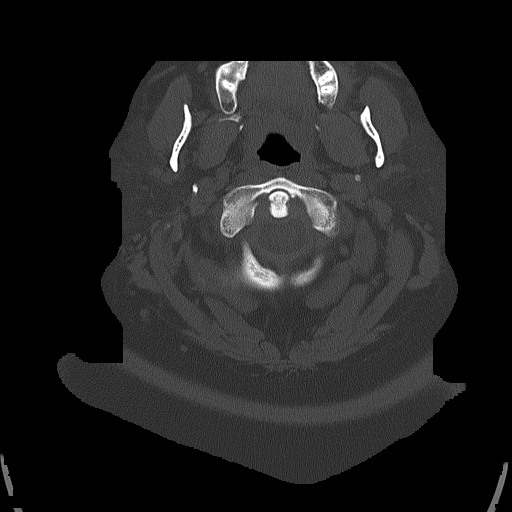
[im 19/95  bone]
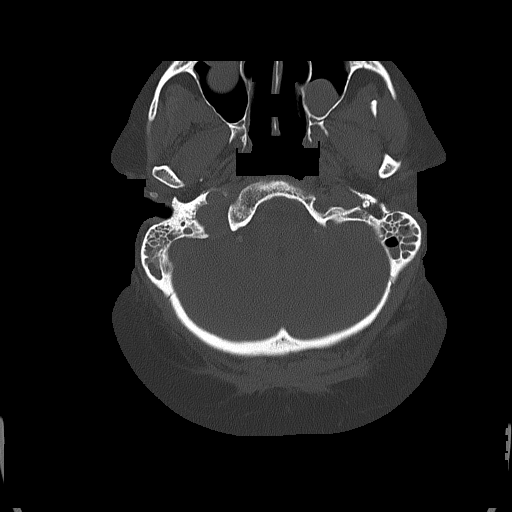
[im 29/95  bone]
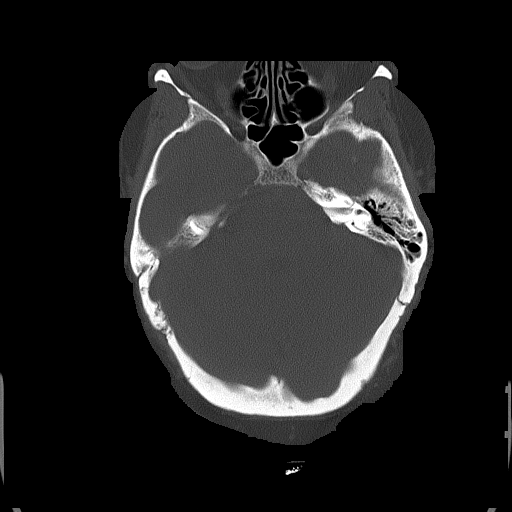

[Series 4: coronal soft tissue · coronal · 0.35mm/px · 3 of 84 slices shown]
[im 28/84  brain]
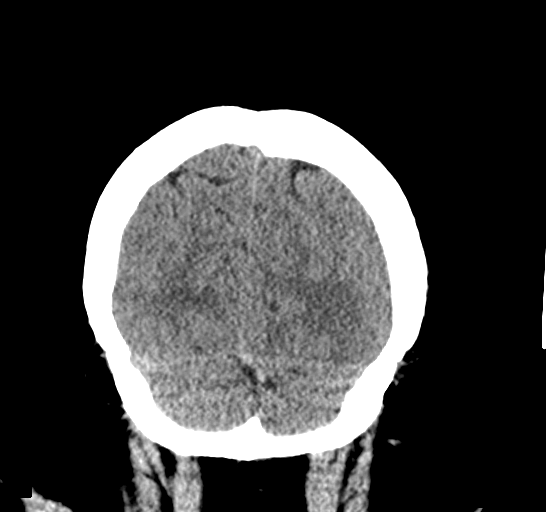
[im 37/84  brain]
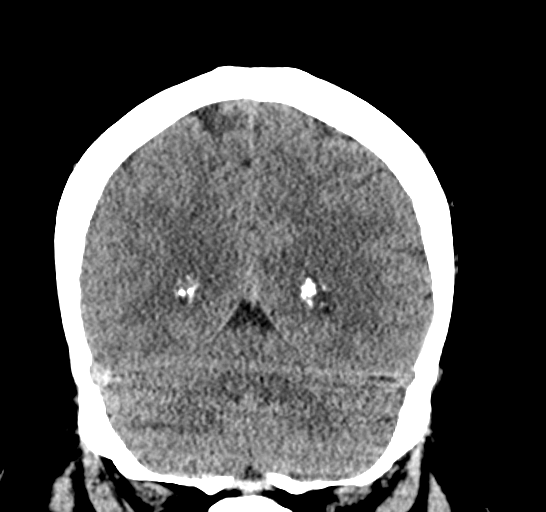
[im 47/84  brain]
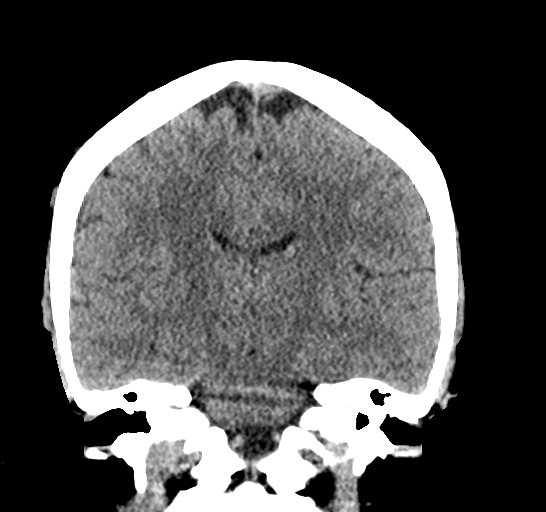

[Series 5: sagittal soft tissue · sagittal · 0.35mm/px · 3 of 64 slices shown]
[im 22/64  brain]
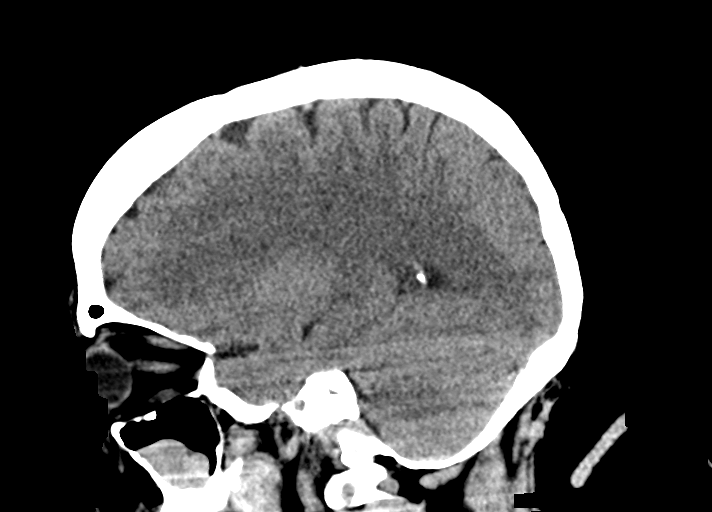
[im 32/64  brain]
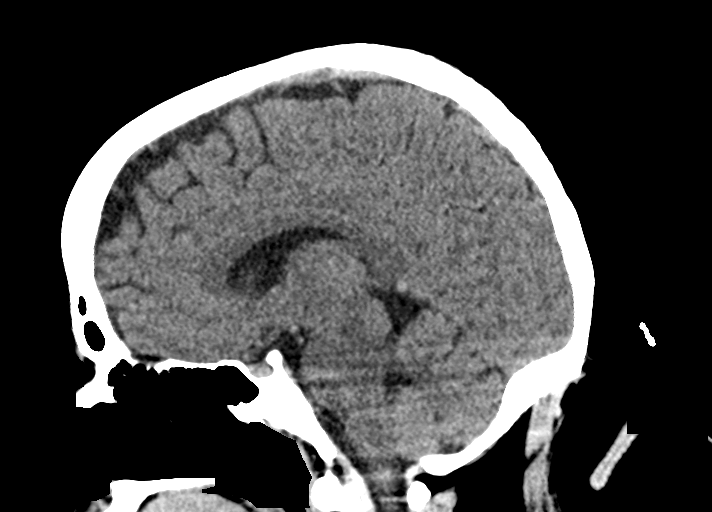
[im 43/64  brain]
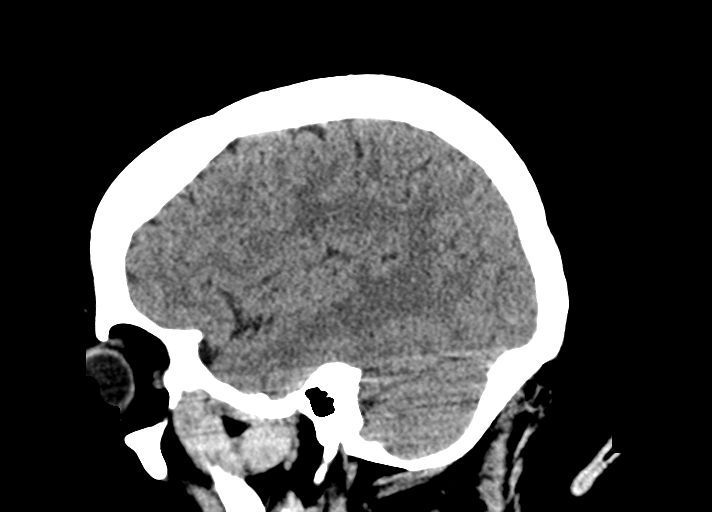

[16 of 47 positions shown; findings below may reference images not displayed]

FINDINGS: Brain: No evidence of acute infarction, hemorrhage, hydrocephalus,
extra-axial collection or mass lesion/mass effect.

Vascular: No hyperdense vessel or unexpected calcification.

Skull: Normal. Negative for fracture or focal lesion.

Sinuses/Orbits: Mucous retention cysts in the bilateral maxillary
sinuses. Orbits are unremarkable.

Other: Right greater than left mastoid effusions, similar prior.
IMPRESSION: 1. No acute intracranial abnormality.
2. Right greater than left mastoid effusions, similar prior.

## 2021-05-01 IMAGING — MR MR CERVICAL SPINE W/O CM
5 of 6 series · 39 of 48 positions shown · IV contrast (Contrast agent)
Comparison: Head CT from earlier today.

CLINICAL DATA: Headache, new or worsening. Pain extends into the
right arm.

EXAM:
MRI HEAD WITHOUT CONTRAST
MRI CERVICAL SPINE WITHOUT CONTRAST
TECHNIQUE: Multiplanar, multiecho pulse sequences of the brain and surrounding
structures, and cervical spine, to include the craniocervical
junction and cervicothoracic junction, were obtained without
intravenous contrast.

[Series 9: T2 · sagittal · 3.0mm · 0.62mm/px · 6 of 15 slices shown (1 of 2)]
[im 1/15]
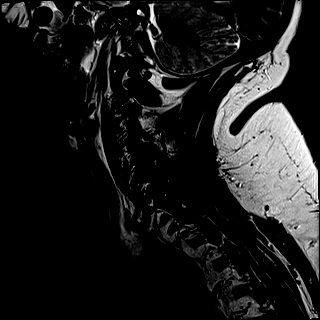
[im 3/15]
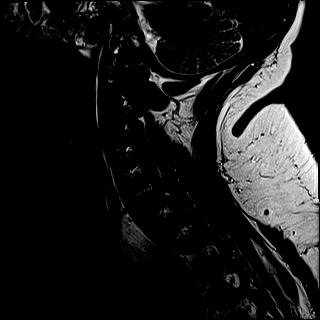
[im 6/15]
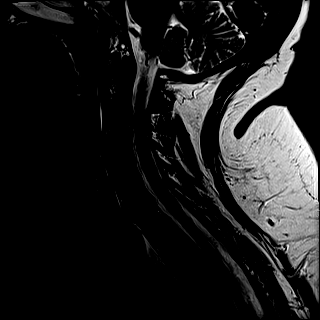
[im 9/15]
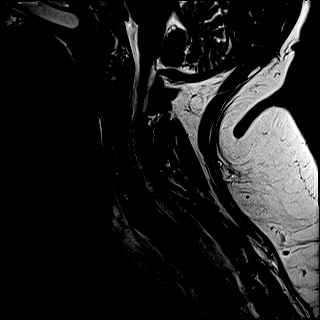
[im 12/15]
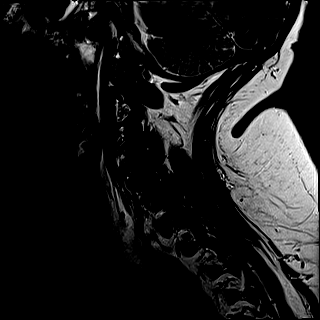
[im 15/15]
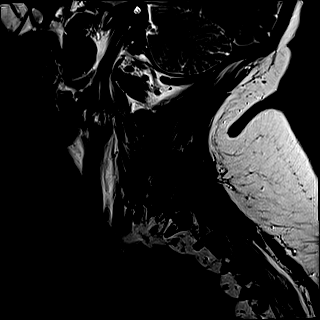

[Series 10: FLAIR · sagittal · 3.0mm · 0.78mm/px · 7 of 15 slices shown]
[im 1/15]
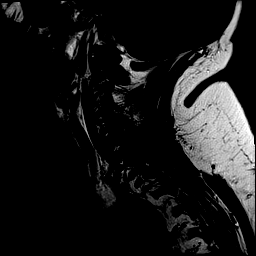
[im 3/15]
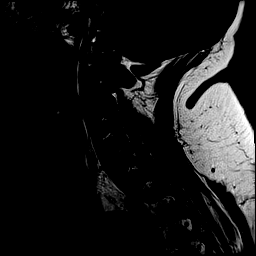
[im 5/15]
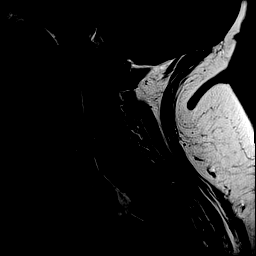
[im 8/15]
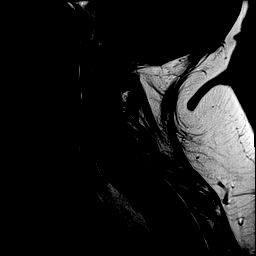
[im 10/15]
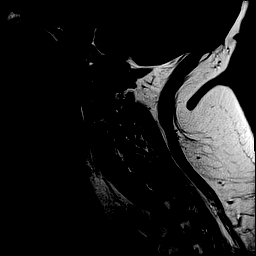
[im 12/15]
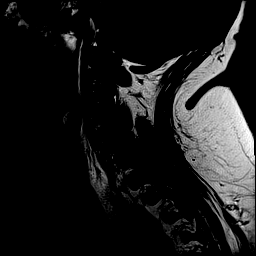
[im 15/15]
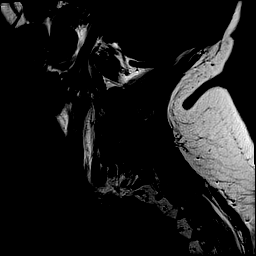

[Series 11: STIR · sagittal · 3.0mm · 0.62mm/px · 7 of 15 slices shown]
[im 1/15]
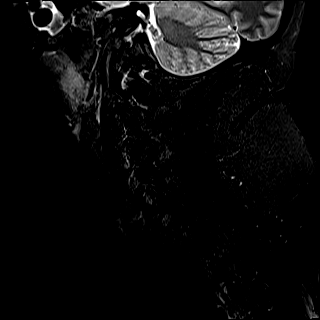
[im 3/15]
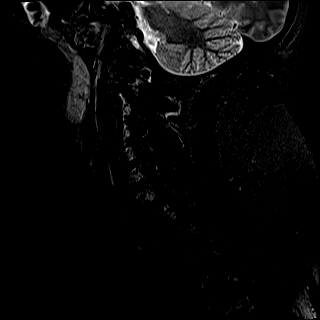
[im 5/15]
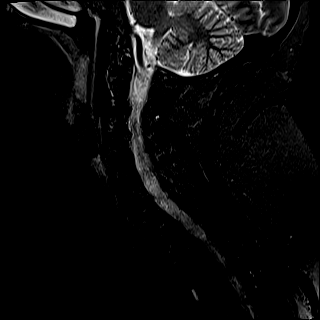
[im 8/15]
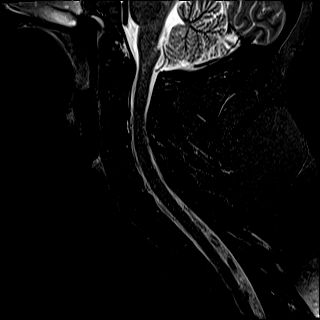
[im 10/15]
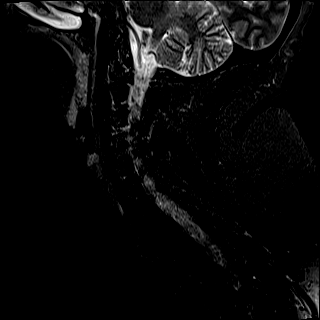
[im 12/15]
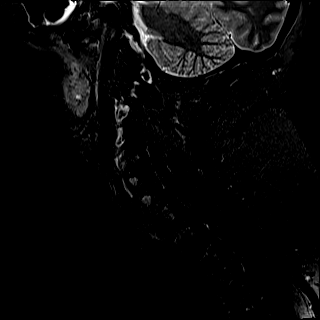
[im 15/15]
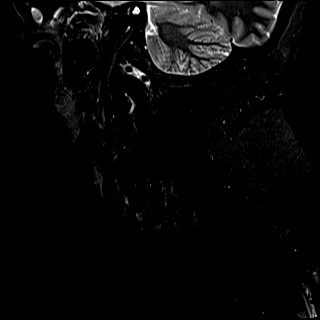

[Series 12: T2 · axial · 3.0mm · 0.70mm/px · z∈[-229,-136]mm · 11 of 29 slices shown (2 of 2)]
[im 1/29]
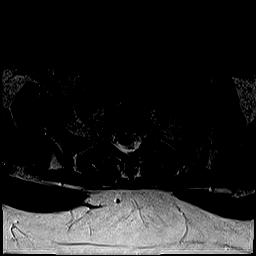
[im 3/29]
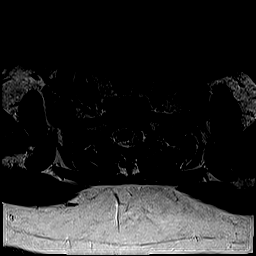
[im 5/29]
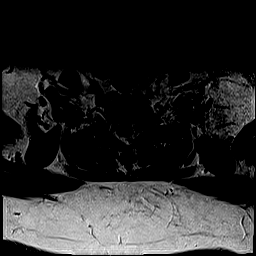
[im 8/29]
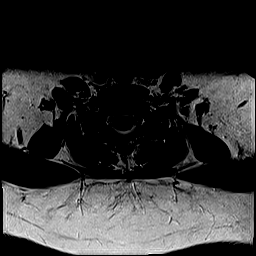
[im 10/29]
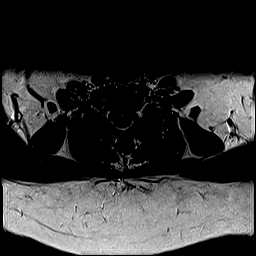
[im 12/29]
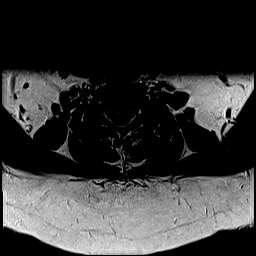
[im 15/29]
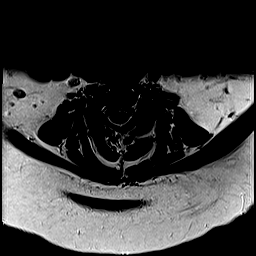
[im 17/29]
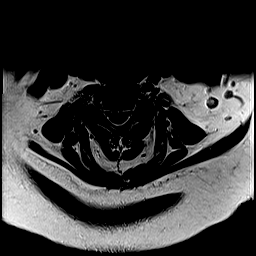
[im 19/29]
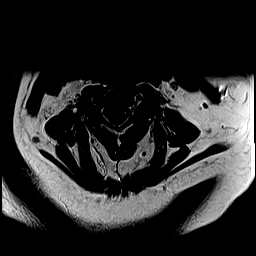
[im 24/29]
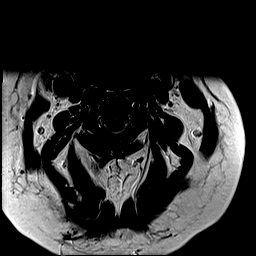
[im 29/29]
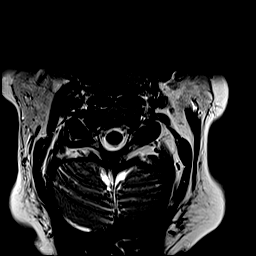

[Series 13: ax mpgr · axial · 3.0mm · 0.35mm/px · z∈[-229,-136]mm · 8 of 29 slices shown]
[im 1/29]
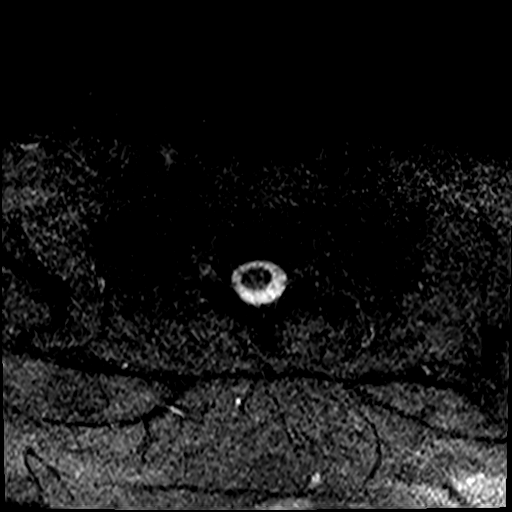
[im 5/29]
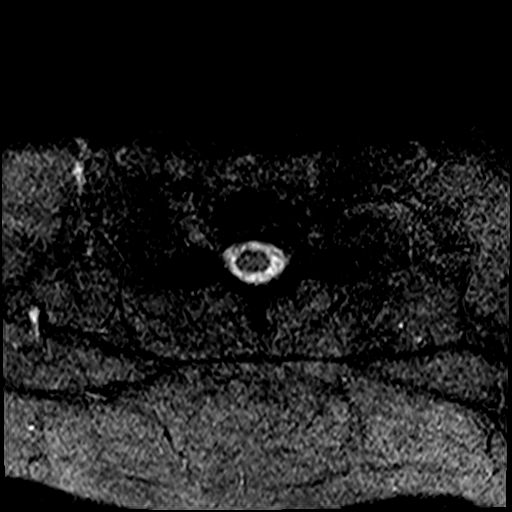
[im 10/29]
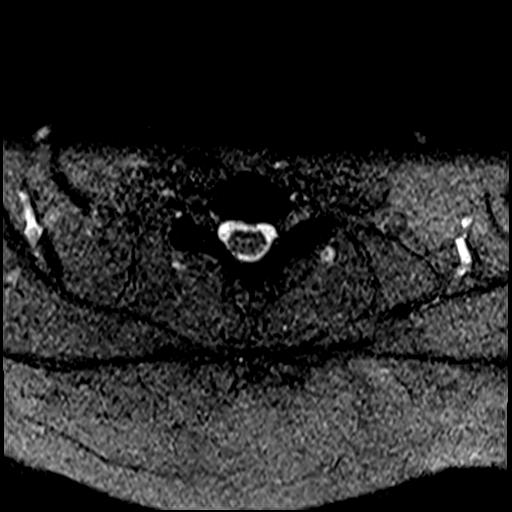
[im 12/29]
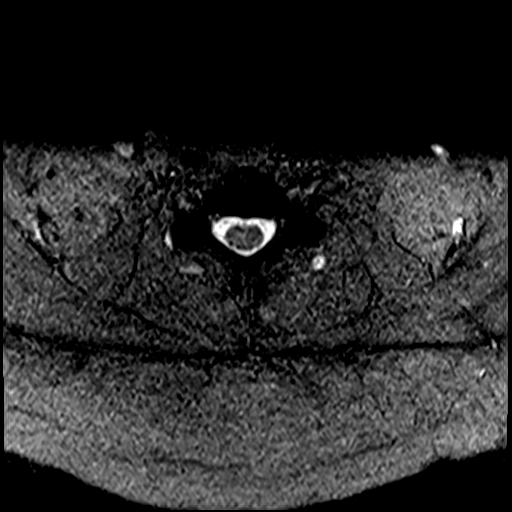
[im 17/29]
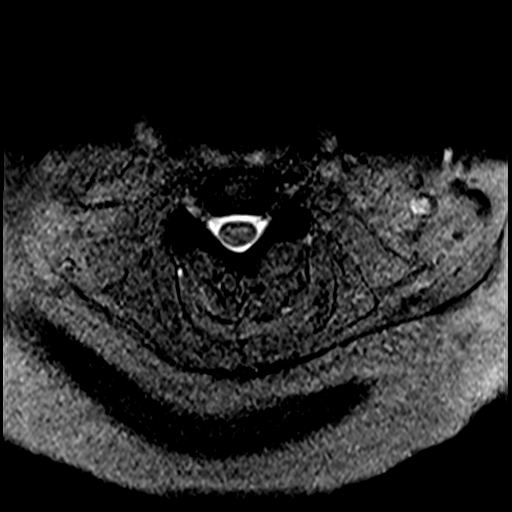
[im 19/29]
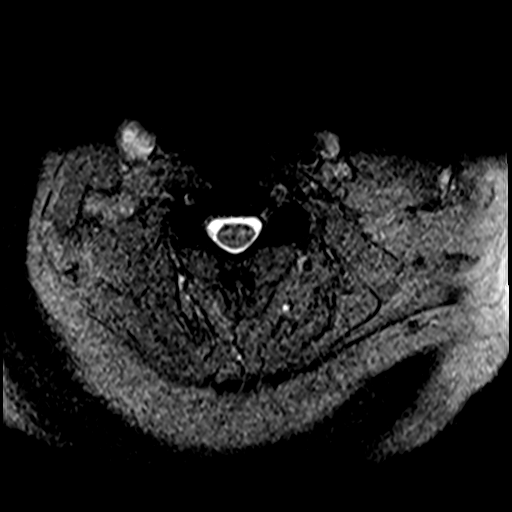
[im 24/29]
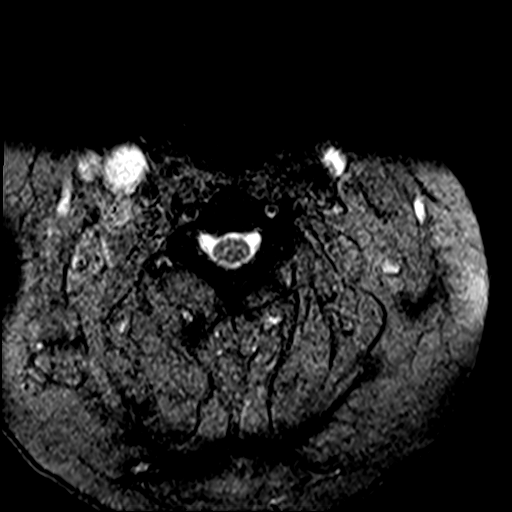
[im 29/29]
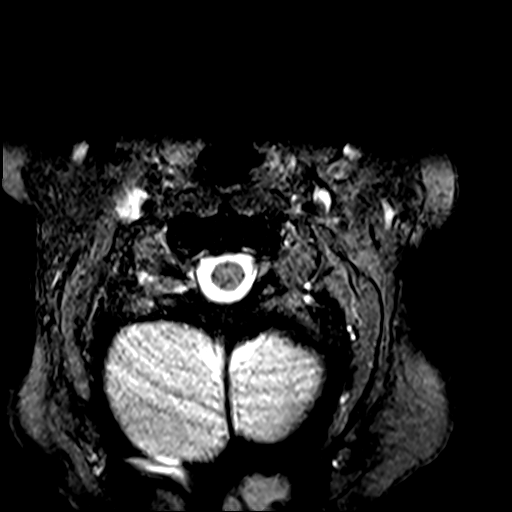

[39 of 48 positions shown; findings below may reference images not displayed]

FINDINGS: MRI HEAD FINDINGS

Brain: No acute infarction, hemorrhage, hydrocephalus, extra-axial
collection or mass lesion. Few remote white matter insults with
nonspecific pattern, history of hypertension and headache favoring
chronic small vessel ischemia. Brain volume is normal.

Vascular: Normal flow voids.

Skull and upper cervical spine: Normal marrow signal

Sinuses/Orbits: Proteinaceous retention cysts in the bilateral
maxillary sinus. Bilateral mastoid opacification which is chronic
based on [Z7] head CT.

MRI CERVICAL SPINE FINDINGS

Alignment: Physiologic.

Vertebrae: No fracture, evidence of discitis, or bone lesion.

Cord: Normal signal and morphology.

Posterior Fossa, vertebral arteries, paraspinal tissues: Negative.

Disc levels:

C2-3: Minor disc bulging and uncovertebral spurring

C3-4: Minor facet and uncovertebral spurring.

C4-5: Mild facet spurring

C5-6: Small central disc protrusion and mild facet spurring.

C6-7: Small central disc protrusion.

C7-T1:Unremarkable.
IMPRESSION: Brain MRI:

1. No acute finding or explanation for symptoms.
2. Chronic bilateral mastoid opacification, present since at least
the [Z7] head CT

Cervical MRI:

No acute finding or neural impingement. Early degenerative changes
are described above.

## 2021-05-01 MED ORDER — ALUM & MAG HYDROXIDE-SIMETH 200-200-20 MG/5ML PO SUSP
30.0000 mL | Freq: Once | ORAL | Status: AC
  Administered 2021-05-01: 30 mL via ORAL
  Filled 2021-05-01: qty 30

## 2021-05-01 MED ORDER — ACETAMINOPHEN 500 MG PO TABS
1000.0000 mg | ORAL_TABLET | Freq: Once | ORAL | Status: AC
  Administered 2021-05-01: 1000 mg via ORAL
  Filled 2021-05-01: qty 2

## 2021-05-01 MED ORDER — LIDOCAINE VISCOUS HCL 2 % MT SOLN
15.0000 mL | Freq: Once | OROMUCOSAL | Status: AC
  Administered 2021-05-01: 15 mL via ORAL
  Filled 2021-05-01: qty 15

## 2021-05-01 NOTE — ED Provider Notes (Signed)
[facility] Provider Note    Event Date/Time   First MD Initiated Contact with Patient 05/01/21 0901     (approximate)   History   Dizziness, Hypertension, and Chest Pain   HPI  Ashlee Everett is a 51 y.o. female patient for some mild headache last night when going to bed this morning she woke up and it was worse diffuse throughout her head.  Additionally she felt numbness and tingling all over her body when she woke up this is almost completely gone except for some tingling in both arms on arrival here.  She is not ataxic.  She is a little bit lightheaded after she sat up to go to the bathroom after she had been laying for 2 hours.  She is walking without any difficulty however.  Blood pressure was elevated upon arrival but had gone down to 145/80 after resting.  Review of her old records show she has had something like this in the past.      Physical Exam   Triage Vital Signs: ED Triage Vitals  Enc Vitals Group     BP 05/01/21 0822 (!) 157/87     Pulse Rate 05/01/21 0822 75     Resp 05/01/21 0822 18     Temp 05/01/21 0822 98.9 F (37.2 C)     Temp Source 05/01/21 0822 Oral     SpO2 05/01/21 0822 100 %     Weight 05/01/21 0823 250 lb (113.4 kg)     Height 05/01/21 0823 5\' 1"  (1.549 m)     Head Circumference --      Peak Flow --      Pain Score 05/01/21 0829 8     Pain Loc --      Pain Edu? --      Excl. in [place]? --     Most recent vital signs: Vitals:   05/01/21 0822 05/01/21 1042  BP: (!) 157/87 (!) 145/80  Pulse: 75 62  Resp: 18 18  Temp: 98.9 F (37.2 C)   SpO2: 100% 100%     General: Awake, alert, no distress.  Eyes: Pupils equal round extraocular movements intact Neck: Supple nontender CV:  Good peripheral perfusion.  Heart regular rate and rhythm no audible murmurs Resp:  Normal effort.  Lungs are clear Abd:  No distention.  Soft nontender except for some what tender in the epigastric area only Extremities no edema Neuro  exam cranial nerves II through XII are intact although visual fields were not checked cerebellar finger-nose is normal motor strength is 5/5 throughout patient does not report any numbness except for the tingling in both arms.   ED Results / Procedures / Treatments   Labs (all labs ordered are listed, but only abnormal results are displayed) Labs Reviewed  BASIC METABOLIC PANEL - Abnormal; Notable for the following components:      Result Value   Glucose, Bld 141 (*)    Calcium 8.6 (*)    All other components within normal limits  HEPATIC FUNCTION PANEL - Abnormal; Notable for the following components:   AST 14 (*)    All other components within normal limits  CBC  LIPASE, BLOOD  TROPONIN I (HIGH SENSITIVITY)  TROPONIN I (HIGH SENSITIVITY)     EKG  EKG read interpreted by me shows normal sinus rhythm rate of 77 normal axis no acute ST-T wave changes.  EKG looks similar to the previous 1.   RADIOLOGY X-ray of the chest and  head CT and MRI brain and neck which were done for the tingling in the arms and the "dizziness" were all read as no acute changes.  I reviewed the films.   PROCEDURES:  Critical Care performed:   Procedures   MEDICATIONS ORDERED IN ED: Medications  acetaminophen (TYLENOL) tablet 1,000 mg (1,000 mg Oral Given 05/01/21 0956)  alum & mag hydroxide-simeth (MAALOX/MYLANTA) 200-200-20 MG/5ML suspension 30 mL (30 mLs Oral Given 05/01/21 0956)    And  lidocaine (XYLOCAINE) 2 % viscous mouth solution 15 mL (15 mLs Oral Given 05/01/21 0956)     IMPRESSION / MDM / ASSESSMENT AND PLAN / ED COURSE  I reviewed the triage vital signs and the nursing notes. Patient's blood pressure has improved.  Patient's tingling is somewhat better.  Studies are all within normal limits or have chronic changes.  The patient is on the cardiac monitor to evaluate for evidence of arrhythmia and/or significant heart rate changes.  None were seen There is no sign of stroke hemorrhagic  or ischemic.  There is no sign of nerve impingement in the neck or impingement on the spinal cord by disc.  Patient symptoms are improving.  Her troponins are negative EKG was okay no sign of any cardiac issues.  I will have patient follow-up very soon with primary care to see about increasing her blood pressure medication.  She will return for any further problems.       FINAL CLINICAL IMPRESSION(S) / ED DIAGNOSES   Final diagnoses:  Dizziness     Rx / DC Orders   ED Discharge Orders     None        Note:  This document was prepared using Dragon voice recognition software and may include unintentional dictation errors.   Nena Polio, MD 05/01/21 1213

## 2021-05-01 NOTE — ED Provider Notes (Signed)
[facility] Provider Note    Event Date/Time   First MD Initiated Contact with Patient 05/01/21 0901     (approximate)   History   Dizziness, Hypertension, and Chest Pain   HPI  Ashlee Everett is a 50 y.o. female who reports she had a mild headache late yesterday but woke up this morning had a much worse headache that was kind of diffuse, tingling in both arms and some lower chest discomfort.  Her blood pressure was elevated to 767 systolic there was no shortness of breath or nausea.  No clumsiness or weakness.  Patient has had similar symptoms at least with a headache and blood pressure being elevated.  She was started on losartan about 2 months ago.  This is not reflected in medicines listed in her hospital chart.  It is in her clinic notes however she takes metoprolol for paroxysmal SVT. ----------------------------------------- 10:42 AM on 05/01/2021 ----------------------------------------- Patient reports that this morning when she woke up and the headache was very bad she felt tingly all over.  Now she still having some tingliness in the arms.  There is no weakness in the arms.  There is no neck pain.  Patient has a nonfocal neuro exam and is able to walk without any difficulty.  She does complain of some lightheadedness sitting up after she been lying for 2 hours.     Physical Exam   Triage Vital Signs: ED Triage Vitals  Enc Vitals Group     BP 05/01/21 0822 (!) 157/87     Pulse Rate 05/01/21 0822 75     Resp 05/01/21 0822 18     Temp 05/01/21 0822 98.9 F (37.2 C)     Temp Source 05/01/21 0822 Oral     SpO2 05/01/21 0822 100 %     Weight 05/01/21 0823 250 lb (113.4 kg)     Height 05/01/21 0823 5\' 1"  (1.549 m)     Head Circumference --      Peak Flow --      Pain Score 05/01/21 0829 8     Pain Loc --      Pain Edu? --      Excl. in [place]? --     Most recent vital signs: Vitals:   05/01/21 1042 05/01/21 1210  BP: (!) 145/80 (!) 145/80   Pulse: 62 73  Resp: 18 18  Temp:    SpO2: 100% 99%     General: Awake, no distress.  Head: Normocephalic atraumatic Eyes: Pupils equal round extraocular movements intact CV:  Good peripheral perfusion.  Heart regular rate and rhythm no audible murmur Resp:  Normal effort.  Lungs are clear Abd:  No distention.  Abdomen soft there is some epigastric tenderness Neuro: Cranial nerves II through XII are intact, visual fields were not checked finger-nose is normal motor strength is 5/5 throughout patient does not report any other numbness besides the arms   ED Results / Procedures / Treatments   Labs (all labs ordered are listed, but only abnormal results are displayed) Labs Reviewed  BASIC METABOLIC PANEL - Abnormal; Notable for the following components:      Result Value   Glucose, Bld 141 (*)    Calcium 8.6 (*)    All other components within normal limits  HEPATIC FUNCTION PANEL - Abnormal; Notable for the following components:   AST 14 (*)    All other components within normal limits  CBC  LIPASE, BLOOD  TROPONIN I (HIGH  SENSITIVITY)  TROPONIN I (HIGH SENSITIVITY)     EKG  EKG read interpreted by me shows normal sinus rhythm rate of 70 normal axis no acute ST-T wave changes EKG looks very similar to 1 from October 9 last year   RADIOLOGY Chest x-ray read by radiology reviewed by me is the same as prior.  Radiologist notes some widening of the mediastinum due to right-sided aortic arch and azygous vein which the radiologist compared to prior chest CT.  Finally compared to prior chest x-ray. CT of the head read by radiology reviewed by me is negative radiologist remarked on bilateral mastoid fluid.  I did notice the mastoid fluid on the right I did not really see much on the left.  PROCEDURES:  Critical Care performed:   Procedures   MEDICATIONS ORDERED IN ED: Medications  acetaminophen (TYLENOL) tablet 1,000 mg (1,000 mg Oral Given 05/01/21 0956)  alum & mag  hydroxide-simeth (MAALOX/MYLANTA) 200-200-20 MG/5ML suspension 30 mL (30 mLs Oral Given 05/01/21 0956)    And  lidocaine (XYLOCAINE) 2 % viscous mouth solution 15 mL (15 mLs Oral Given 05/01/21 0956)     IMPRESSION / MDM / ASSESSMENT AND PLAN / ED COURSE  I reviewed the triage vital signs and the nursing notes. We will watch patient's blood pressure in the ER try a GI cocktail for her epigastric pain.  Patient reports the epigastric pain seems to be radiating up into where her chest pain was.  Initial troponin is negative.  We will try a second troponin in about 2 hours. Second troponin was negative.  Patient feels better.  No more complaints of abdominal pain tingling is almost gone.  Dizziness is gone.  MRI of the head and neck was negative.  Patient's blood pressure is better.  145/80 on discharge I will let her go.  I am not sure what this was I do not think you was related to her hypertension however I will have her follow-up with her primary care doctor soon as possible.  I expect the antihypertensive medication probably will be adjusted.  The patient is on the cardiac monitor to evaluate for evidence of arrhythmia and/or significant heart rate changes.  None were seen       FINAL CLINICAL IMPRESSION(S) / ED DIAGNOSES   Final diagnoses:  Dizziness     Rx / DC Orders   ED Discharge Orders     None        Note:  This document was prepared using Dragon voice recognition software and may include unintentional dictation errors.   Nena Polio, MD 05/01/21 [phone]

## 2021-05-01 NOTE — ED Triage Notes (Signed)
Pt reports yesterday started with a slight HA and just not feeling right and this am she woke up and her BP was 165/103, her chest was hurting with radiation to her right arm and she felt dizzy and tingly all over. Pt describes the pain and heavy and sharpe. Pt reports hx of cardiac issues that include angina and irregular HR. Pt states she see Dr. Ubaldo Glassing.

## 2021-05-01 NOTE — ED Provider Triage Note (Signed)
Emergency Medicine Provider Triage Evaluation Note  Ashlee Everett , a 51 y.o. female  was evaluated in triage.  Pt complains of  headache, dizziness, elevated BP. Then developed some chest discomfort today as well.  Took blood pressure medications.  Review of Systems  Positive: Headache, chest pain  Negative: Fever   Physical Exam  BP (!) 157/87 (BP Location: Left Arm)    Pulse 75    Temp 98.9 F (37.2 C) (Oral)    Resp 18    Ht 5\' 1"  (1.549 m)    Wt 113.4 kg    SpO2 100%    BMI 47.24 kg/m  Gen:   Awake, no distress   Resp:  Normal effort  MSK:   Moves extremities without difficulty  Other:  Neuro: equal strength in bilateral arms and legs sensation in tact bilaterally. No obvious CN deficits.   Medical Decision Making  Medically screening exam initiated at 8:33 AM.  Appropriate orders placed.  MARYALYCE SANJUAN was informed that the remainder of the evaluation will be completed by another provider, this initial triage assessment does not replace that evaluation, and the importance of remaining in the ED until their evaluation is complete.  CT head, labs, tylenol for pain    Vanessa [place], MD 05/01/21 [phone]

## 2021-05-01 NOTE — Discharge Instructions (Addendum)
The MRIs and CT and blood work have all been normal.    Your blood pressure was elevated when you got here it is better now.  I would follow-up with your primary care doctor and see if they want to increase the losartan at all.  Please return for any further problems.

## 2021-05-10 DIAGNOSIS — Z79899 Other long term (current) drug therapy: Secondary | ICD-10-CM | POA: Diagnosis not present

## 2021-05-10 DIAGNOSIS — H6983 Other specified disorders of Eustachian tube, bilateral: Secondary | ICD-10-CM | POA: Diagnosis not present

## 2021-05-10 DIAGNOSIS — I160 Hypertensive urgency: Secondary | ICD-10-CM | POA: Diagnosis not present

## 2021-06-08 DIAGNOSIS — M25562 Pain in left knee: Secondary | ICD-10-CM | POA: Diagnosis not present

## 2021-06-09 DIAGNOSIS — M25562 Pain in left knee: Secondary | ICD-10-CM | POA: Diagnosis not present

## 2021-07-13 DIAGNOSIS — K5989 Other specified functional intestinal disorders: Secondary | ICD-10-CM | POA: Diagnosis not present

## 2021-07-13 DIAGNOSIS — R197 Diarrhea, unspecified: Secondary | ICD-10-CM | POA: Diagnosis not present

## 2021-07-13 DIAGNOSIS — M47816 Spondylosis without myelopathy or radiculopathy, lumbar region: Secondary | ICD-10-CM | POA: Diagnosis not present

## 2021-07-13 DIAGNOSIS — R110 Nausea: Secondary | ICD-10-CM | POA: Diagnosis not present

## 2021-07-13 DIAGNOSIS — R1013 Epigastric pain: Secondary | ICD-10-CM | POA: Diagnosis not present

## 2021-07-16 ENCOUNTER — Encounter: Payer: Self-pay | Admitting: Emergency Medicine

## 2021-07-16 ENCOUNTER — Emergency Department: Payer: 59

## 2021-07-16 ENCOUNTER — Emergency Department
Admission: EM | Admit: 2021-07-16 | Discharge: 2021-07-16 | Disposition: A | Discharge status: Home / Self Care | Payer: 59 | Attending: Student in an Organized Health Care Education/Training Program | Admitting: Student in an Organized Health Care Education/Training Program

## 2021-07-16 ENCOUNTER — Other Ambulatory Visit: Payer: Self-pay

## 2021-07-16 DIAGNOSIS — R9431 Abnormal electrocardiogram [ECG] [EKG]: Secondary | ICD-10-CM | POA: Diagnosis not present

## 2021-07-16 DIAGNOSIS — R197 Diarrhea, unspecified: Secondary | ICD-10-CM | POA: Insufficient documentation

## 2021-07-16 DIAGNOSIS — I700 Atherosclerosis of aorta: Secondary | ICD-10-CM | POA: Diagnosis not present

## 2021-07-16 DIAGNOSIS — R1032 Left lower quadrant pain: Secondary | ICD-10-CM | POA: Diagnosis not present

## 2021-07-16 DIAGNOSIS — R110 Nausea: Secondary | ICD-10-CM | POA: Diagnosis not present

## 2021-07-16 DIAGNOSIS — R109 Unspecified abdominal pain: Secondary | ICD-10-CM | POA: Diagnosis not present

## 2021-07-16 DIAGNOSIS — R1013 Epigastric pain: Secondary | ICD-10-CM | POA: Diagnosis not present

## 2021-07-16 DIAGNOSIS — R1084 Generalized abdominal pain: Secondary | ICD-10-CM

## 2021-07-16 LAB — CBC
HCT: 43.9 % (ref 36.0–46.0)
Hemoglobin: 14.2 g/dL (ref 12.0–15.0)
MCH: 28.9 pg (ref 26.0–34.0)
MCHC: 32.3 g/dL (ref 30.0–36.0)
MCV: 89.4 fL (ref 80.0–100.0)
Platelets: 365 10*3/uL (ref 150–400)
RBC: 4.91 MIL/uL (ref 3.87–5.11)
RDW: 13.1 % (ref 11.5–15.5)
WBC: 7.4 10*3/uL (ref 4.0–10.5)
nRBC: 0 % (ref 0.0–0.2)

## 2021-07-16 LAB — COMPREHENSIVE METABOLIC PANEL
ALT: 31 U/L (ref 0–44)
AST: 24 U/L (ref 15–41)
Albumin: 3.8 g/dL (ref 3.5–5.0)
Alkaline Phosphatase: 38 U/L (ref 38–126)
Anion gap: 9 (ref 5–15)
BUN: 10 mg/dL (ref 6–20)
CO2: 26 mmol/L (ref 22–32)
Calcium: 8.8 mg/dL — ABNORMAL LOW (ref 8.9–10.3)
Chloride: 103 mmol/L (ref 98–111)
Creatinine, Ser: 0.61 mg/dL (ref 0.44–1.00)
GFR, Estimated: >60 mL/min (ref >60)
Glucose, Bld: 104 mg/dL — ABNORMAL HIGH (ref 70–99)
Potassium: 4.1 mmol/L (ref 3.5–5.1)
Sodium: 138 mmol/L (ref 135–145)
Total Bilirubin: 0.9 mg/dL (ref 0.3–1.2)
Total Protein: 7.7 g/dL (ref 6.5–8.1)

## 2021-07-16 LAB — URINALYSIS, ROUTINE W REFLEX MICROSCOPIC
Bilirubin Urine: NEGATIVE
Glucose, UA: NEGATIVE mg/dL
Hgb urine dipstick: NEGATIVE
Ketones, ur: NEGATIVE mg/dL
Leukocytes,Ua: NEGATIVE
Nitrite: NEGATIVE
Protein, ur: NEGATIVE mg/dL
Specific Gravity, Urine: 1.01 (ref 1.005–1.030)
pH: 5 (ref 5.0–8.0)

## 2021-07-16 LAB — LIPASE, BLOOD: Lipase: 31 U/L (ref 11–51)

## 2021-07-16 LAB — TROPONIN I (HIGH SENSITIVITY): Troponin I (High Sensitivity): 3 ng/L (ref <18)

## 2021-07-16 IMAGING — CT CT ABD-PELV W/ CM
2 of 5 series · 17 of 46 positions shown, 19 images · IV contrast (APPLIED)
Comparison: CT abdomen and pelvis [DATE]

CLINICAL DATA: Left lower quadrant pain

EXAM:
CT ABDOMEN AND PELVIS WITH CONTRAST
TECHNIQUE: Multidetector CT imaging of the abdomen and pelvis was performed
using the standard protocol following bolus administration of
intravenous contrast.

[Series 2: abdomen 5.0 · axial · 0.98mm/px · z∈[-856,-481]mm · 14 of 87 slices shown, 16 images]
[im 6/87  soft-tissue]
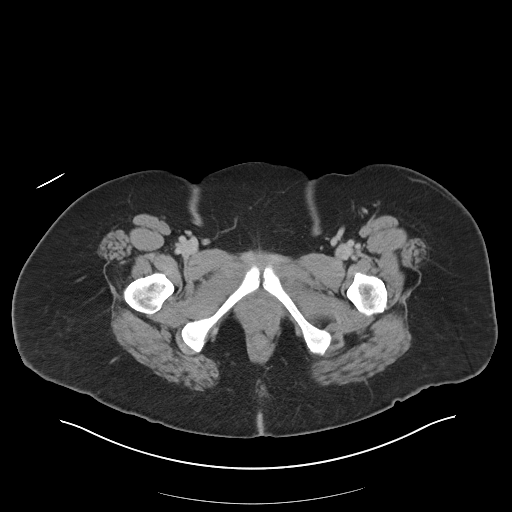
[im 6/87  bone]
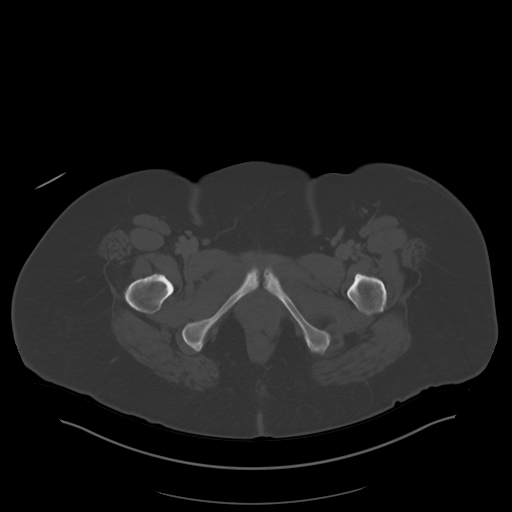
[im 11/87  soft-tissue]
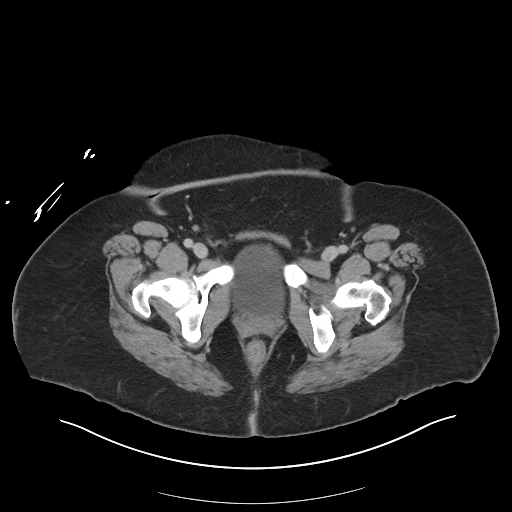
[im 17/87  soft-tissue]
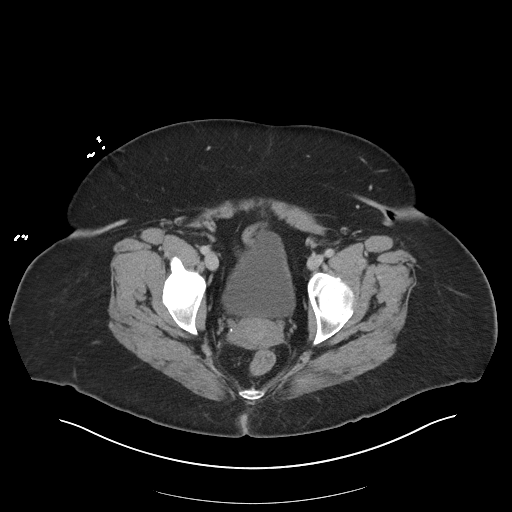
[im 22/87  soft-tissue]
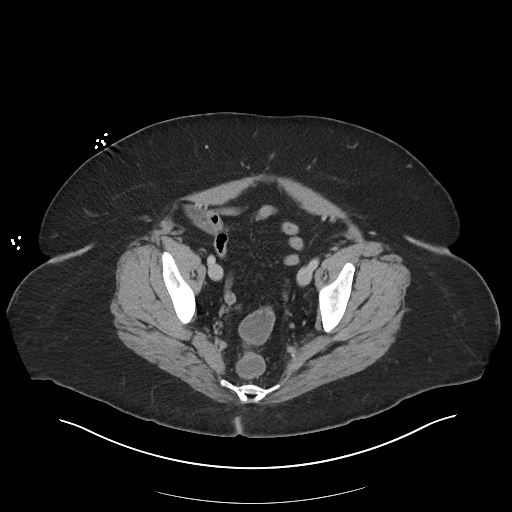
[im 27/87  soft-tissue]
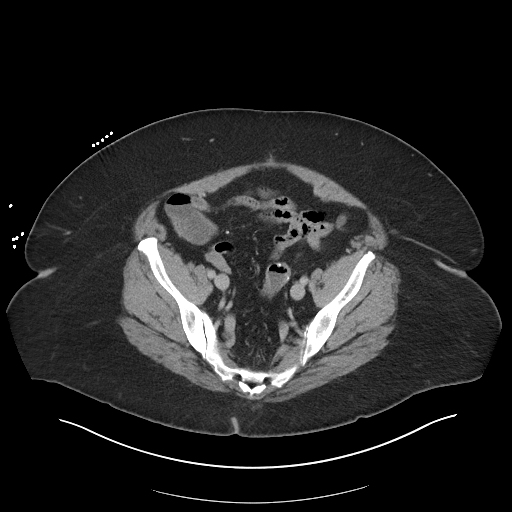
[im 33/87  soft-tissue]
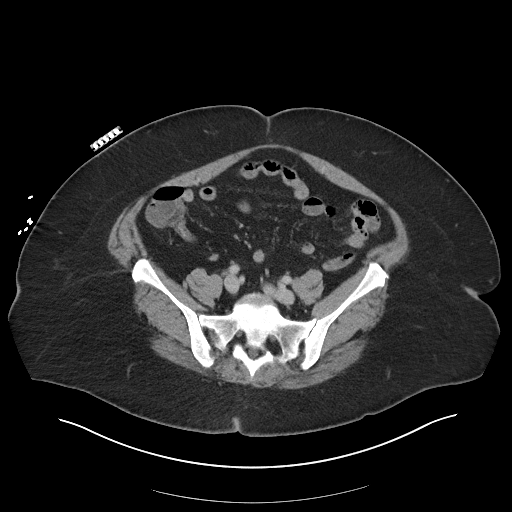
[im 38/87  soft-tissue]
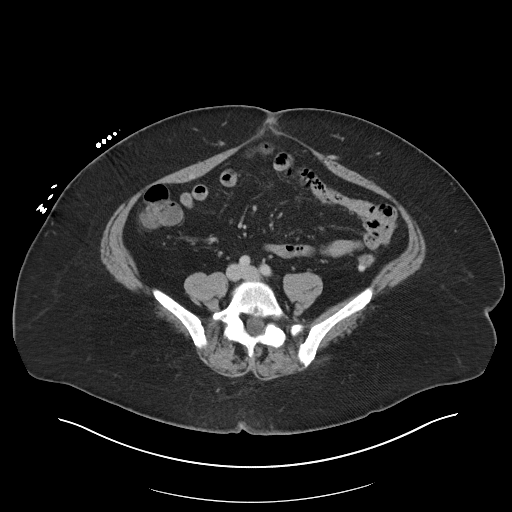
[im 49/87  soft-tissue]
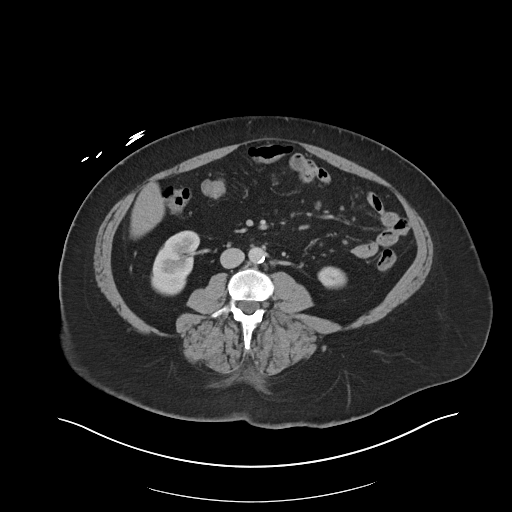
[im 54/87  soft-tissue]
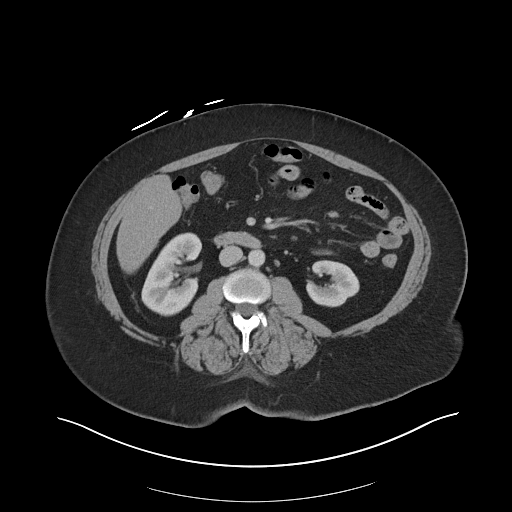
[im 54/87  bone]
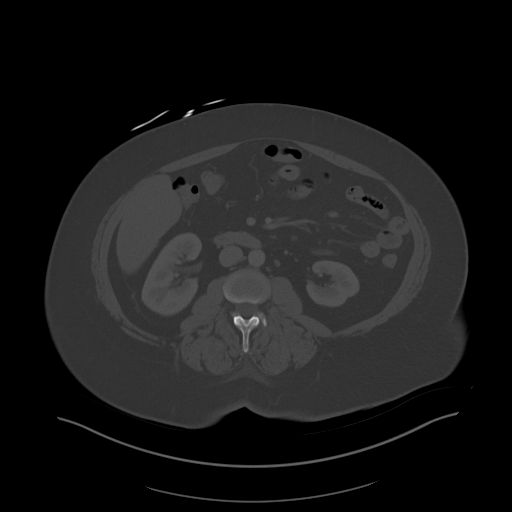
[im 60/87  soft-tissue]
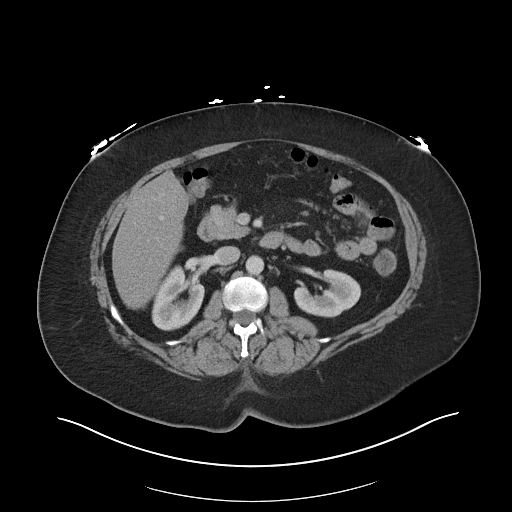
[im 65/87  soft-tissue]
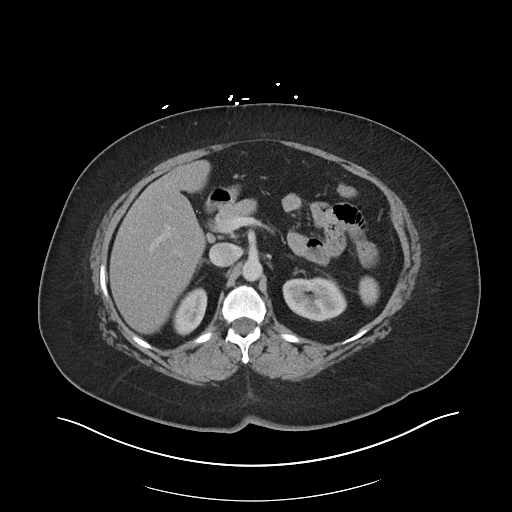
[im 70/87  soft-tissue]
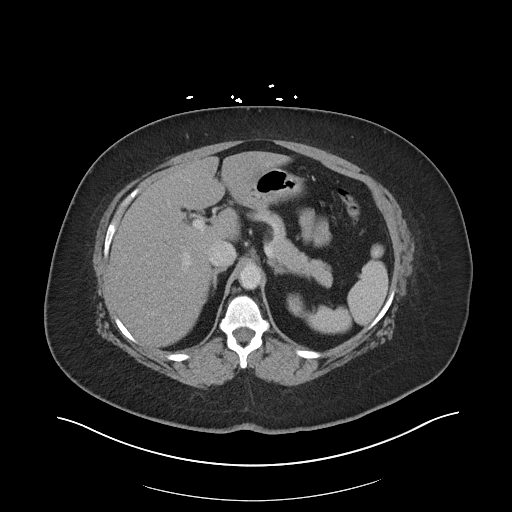
[im 76/87  soft-tissue]
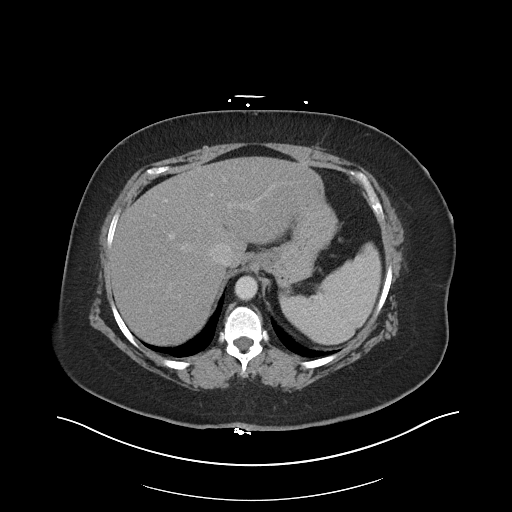
[im 81/87  soft-tissue]
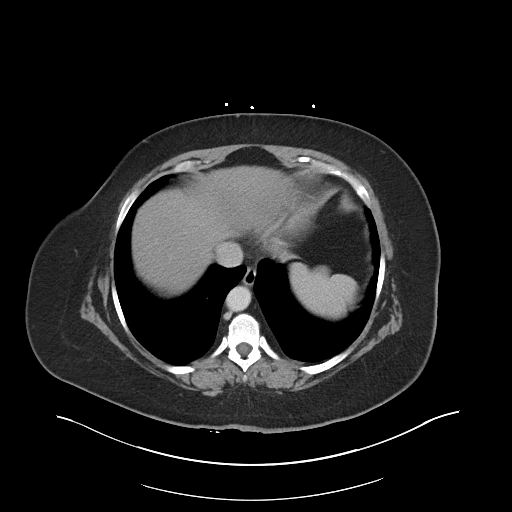

[Series 5: abdomen 3.0 mpr cor · coronal · 0.88mm/px · 3 of 111 slices shown]
[im 37/111  soft-tissue]
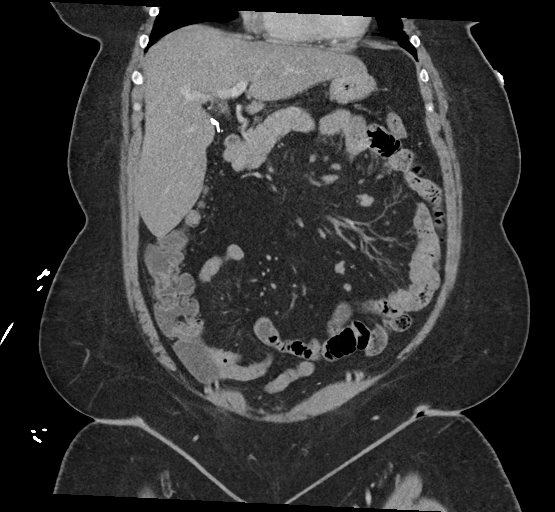
[im 49/111  soft-tissue]
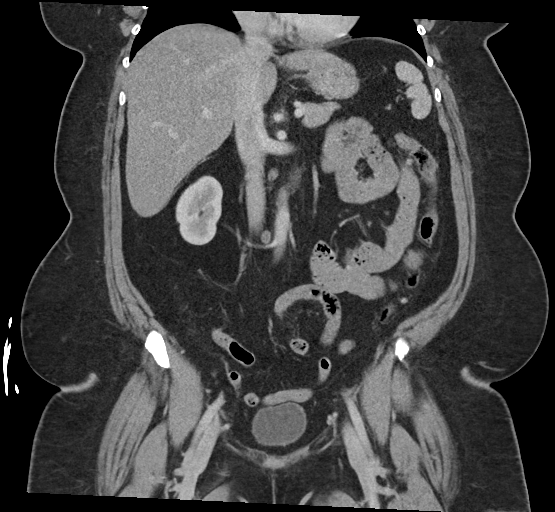
[im 62/111  soft-tissue]
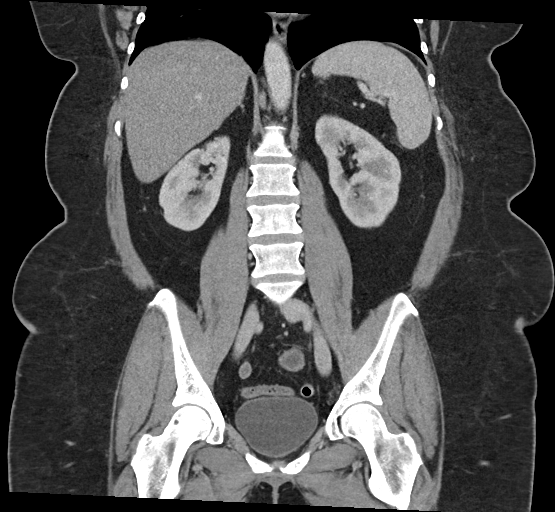

[17 of 46 positions shown; findings below may reference images not displayed]

RADIATION DOSE REDUCTION: This exam was performed according to the
departmental dose-optimization program which includes automated
exposure control, adjustment of the mA and/or kV according to
patient size and/or use of iterative reconstruction technique.

CONTRAST:  100mL OMNIPAQUE IOHEXOL 300 MG/ML  SOLN
FINDINGS: Lower chest: No acute abnormality.

Hepatobiliary: Liver is mildly enlarged measuring 18.2 cm in length.
No suspicious hepatic mass identified. Gallbladder is surgically
absent. No biliary ductal dilatation identified.

Pancreas: Unremarkable. No pancreatic ductal dilatation or
surrounding inflammatory changes.

Spleen: Normal in size without focal abnormality.

Adrenals/Urinary Tract: Adrenal glands are normal. Kidneys appear
normal. Urinary bladder is normal.

Stomach/Bowel: No bowel obstruction, free air or pneumatosis.
Colonic diverticulosis. No bowel wall edema identified. Surgical
anastomotic changes in the sigmoid colon. Appendectomy changes.

Vascular/Lymphatic: Aortic atherosclerosis. No enlarged abdominal or
pelvic lymph nodes.

Reproductive: Status post hysterectomy. No adnexal masses.

Other: No ascites.

Musculoskeletal: No acute or significant osseous findings.
IMPRESSION: 1. No acute process identified.
2. Colonic diverticulosis.
3. Mild hepatomegaly.

## 2021-07-16 MED ORDER — METOCLOPRAMIDE HCL 10 MG PO TABS
10.0000 mg | ORAL_TABLET | Freq: Four times a day (QID) | ORAL | 0 refills | Status: AC | PRN
Start: 2021-07-16 — End: 2022-07-16

## 2021-07-16 MED ORDER — IOHEXOL 300 MG/ML  SOLN
100.0000 mL | Freq: Once | INTRAMUSCULAR | Status: AC | PRN
  Administered 2021-07-16: 100 mL via INTRAVENOUS

## 2021-07-16 MED ORDER — METOCLOPRAMIDE HCL 5 MG/ML IJ SOLN
10.0000 mg | Freq: Once | INTRAMUSCULAR | Status: AC
  Administered 2021-07-16: 10 mg via INTRAVENOUS
  Filled 2021-07-16: qty 2

## 2021-07-16 MED ORDER — SODIUM CHLORIDE 0.9 % IV BOLUS
1000.0000 mL | Freq: Once | INTRAVENOUS | Status: AC
  Administered 2021-07-16: 1000 mL via INTRAVENOUS

## 2021-07-16 NOTE — Discharge Instructions (Signed)

## 2021-07-16 NOTE — ED Notes (Signed)
POCT Urine test Negative. ?

## 2021-07-16 NOTE — ED Provider Notes (Signed)
? ?[facility] ?Provider Note ? ? ? Event Date/Time  ? First MD Initiated Contact with Patient 07/16/21 1113   ?  (approximate) ? ? ?History  ? ?Abdominal Pain ? ? ?HPI ? ?Ashlee Everett is a 51 y.o. female with extensive surgical history status post cholecystectomy appendectomy as well as partial colectomy due to diverticulitis presents to the ER for worsening epigastric and left-sided abdominal pain.  Associate with nausea some loose stools as well no measured fevers no chills.  Has had decreased p.o. intake.  Was seen in urgent care 2 days ago for similar symptoms but feels like symptoms are worsening. ?  ? ? ?Physical Exam  ? ?Triage Vital Signs: ?ED Triage Vitals [07/16/21 1056]  ?Enc Vitals Group  ?   BP (!) 143/87  ?   Pulse Rate 83  ?   Resp 16  ?   Temp 99.2 ?F (37.3 ?C)  ?   Temp Source Oral  ?   SpO2 100 %  ?   Weight 256 lb (116.1 kg)  ?   Height '5\' 1"'$  (1.549 m)  ?   Head Circumference   ?   Peak Flow   ?   Pain Score 9  ?   Pain Loc   ?   Pain Edu?   ?   Excl. in [place]?   ? ? ?Most recent vital signs: ?Vitals:  ? 07/16/21 1230 07/16/21 1430  ?BP: 140/88   ?Pulse: 80 79  ?Resp: 14 (!) 25  ?Temp:    ?SpO2: 100% 98%  ? ? ? ?Constitutional: Alert  ?Eyes: Conjunctivae are normal.  ?Head: Atraumatic. ?Nose: No congestion/rhinnorhea. ?Mouth/Throat: Mucous membranes are moist.   ?Neck: Painless ROM.  ?Cardiovascular:   Good peripheral circulation. No m/g/r ?Respiratory: Normal respiratory effort.  No retractions.  ?Gastrointestinal: Soft with tenderness to palpation of left lower quadrant. ?Musculoskeletal:  no deformity ?Neurologic:  MAE spontaneously. No gross focal neurologic deficits are appreciated.  ?Skin:  Skin is warm, dry and intact. No rash noted. ?Psychiatric: Mood and affect are normal. Speech and behavior are normal. ? ? ? ?ED Results / Procedures / Treatments  ? ?Labs ?(all labs ordered are listed, but only abnormal results are displayed) ?Labs Reviewed  ?COMPREHENSIVE  METABOLIC PANEL - Abnormal; Notable for the following components:  ?    Result Value  ? Glucose, Bld 104 (*)   ? Calcium 8.8 (*)   ? All other components within normal limits  ?URINALYSIS, ROUTINE W REFLEX MICROSCOPIC - Abnormal; Notable for the following components:  ? Color, Urine YELLOW (*)   ? APPearance HAZY (*)   ? All other components within normal limits  ?LIPASE, BLOOD  ?CBC  ?POC URINE PREG, ED  ?TROPONIN I (HIGH SENSITIVITY)  ? ? ? ?EKG ? ?ED ECG REPORT ?I, Merlyn Lot, the attending physician, personally viewed and interpreted this ECG. ? ? Date: 07/16/2021 ? EKG Time: 11:06 ? Rate: 80 ? Rhythm: sinus ? Axis: normal ? Intervals:  lbbb ? ST&T Change: no stemi, no depression ? ? ? ?RADIOLOGY ?Please see ED Course for my review and interpretation. ? ?I personally reviewed all radiographic images ordered to evaluate for the above acute complaints and reviewed radiology reports and findings.  These findings were personally discussed with the patient.  Please see medical record for radiology report. ? ? ? ?PROCEDURES: ? ?Critical Care performed: No ? ?Procedures ? ? ?MEDICATIONS ORDERED IN ED: ?Medications  ?sodium chloride 0.9 % bolus  1,000 mL (1,000 mLs Intravenous New Bag/Given 07/16/21 1232)  ?metoCLOPramide (REGLAN) injection 10 mg (10 mg Intravenous Given 07/16/21 1232)  ?iohexol (OMNIPAQUE) 300 MG/ML solution 100 mL (100 mLs Intravenous Contrast Given 07/16/21 1310)  ? ? ? ?IMPRESSION / MDM / ASSESSMENT AND PLAN / ED COURSE  ?I reviewed the triage vital signs and the nursing notes. ?             ?               ? ?Differential diagnosis includes, but is not limited to, gastritis, colitis, sbo, perf, diverticulitis, enteritis, stone, mass, AAA ? ?Patient presented to ER for symptoms as described above.  Patient nontoxic afebrile hemodynamically stable.  Bladder sent for the above differential otherwise reassuring no sign of renal dysfunction.  LFTs and lipase normal.  CT imaging ordered for above  differential will provide IV fluids as well as antiemetics. ? ? ?Clinical Course as of 07/16/21 1444  ?Sat Jul 16, 2021  ?1341 CT imaging by my review and interpretation does not show any evidence of obstruction mass.  Will await formal radiology report [PR]  ?1444 Patient reassessed.  Feels significantly improved.  Pain resolved after Reglan.  She tolerating p.o.  Does appear stable and appropriate for outpatient follow-up.  We discussed strict return precautions [PR]  ?  ?Clinical Course User Index ?[PR] Merlyn Lot, MD  ? ? ? ?FINAL CLINICAL IMPRESSION(S) / ED DIAGNOSES  ? ?Final diagnoses:  ?Generalized abdominal pain  ?Nausea  ? ? ? ?Rx / DC Orders  ? ?ED Discharge Orders   ? ?      Ordered  ?  metoCLOPramide (REGLAN) 10 MG tablet  Every 6 hours PRN       ? 07/16/21 1435  ? ?  ?  ? ?  ? ? ? ?Note:  This document was prepared using Dragon voice recognition software and may include unintentional dictation errors. ? ?  ?Merlyn Lot, MD ?07/16/21 1444 ? ?

## 2021-07-16 NOTE — ED Triage Notes (Signed)
Pt to ED via POV c/o epigastric abdominal pain. Pt states that she was seen in the [facility] on Wednesday and was told that she was not getting better to come to the ED. Pt reports that they were concerned she had a small bowel blockage. Pt states that she is also nauseated and having loose stools as well. Pt is currently in NAD.  ?

## 2021-08-11 DIAGNOSIS — Z79899 Other long term (current) drug therapy: Secondary | ICD-10-CM | POA: Diagnosis not present

## 2021-08-11 DIAGNOSIS — I160 Hypertensive urgency: Secondary | ICD-10-CM | POA: Diagnosis not present

## 2021-08-18 DIAGNOSIS — R1013 Epigastric pain: Secondary | ICD-10-CM | POA: Diagnosis not present

## 2021-08-18 DIAGNOSIS — I10 Essential (primary) hypertension: Secondary | ICD-10-CM | POA: Diagnosis not present

## 2021-08-18 DIAGNOSIS — Z79899 Other long term (current) drug therapy: Secondary | ICD-10-CM | POA: Diagnosis not present

## 2021-08-18 DIAGNOSIS — R739 Hyperglycemia, unspecified: Secondary | ICD-10-CM | POA: Diagnosis not present

## 2021-08-18 DIAGNOSIS — Z6841 Body Mass Index (BMI) 40.0 and over, adult: Secondary | ICD-10-CM | POA: Diagnosis not present

## 2021-09-21 DIAGNOSIS — I10 Essential (primary) hypertension: Secondary | ICD-10-CM | POA: Diagnosis not present

## 2021-09-21 DIAGNOSIS — I471 Supraventricular tachycardia: Secondary | ICD-10-CM | POA: Diagnosis not present

## 2021-09-21 DIAGNOSIS — Z6841 Body Mass Index (BMI) 40.0 and over, adult: Secondary | ICD-10-CM | POA: Diagnosis not present

## 2021-09-21 DIAGNOSIS — G4733 Obstructive sleep apnea (adult) (pediatric): Secondary | ICD-10-CM | POA: Diagnosis not present

## 2021-09-21 DIAGNOSIS — R0789 Other chest pain: Secondary | ICD-10-CM | POA: Diagnosis not present

## 2022-01-03 ENCOUNTER — Other Ambulatory Visit: Payer: Self-pay

## 2022-01-03 ENCOUNTER — Emergency Department
Admission: EM | Admit: 2022-01-03 | Discharge: 2022-01-03 | Disposition: A | Discharge status: Home / Self Care | Payer: 59 | Attending: Emergency Medicine | Admitting: Emergency Medicine

## 2022-01-03 ENCOUNTER — Emergency Department: Payer: 59

## 2022-01-03 DIAGNOSIS — F439 Reaction to severe stress, unspecified: Secondary | ICD-10-CM

## 2022-01-03 DIAGNOSIS — R519 Headache, unspecified: Secondary | ICD-10-CM | POA: Diagnosis not present

## 2022-01-03 DIAGNOSIS — F430 Acute stress reaction: Secondary | ICD-10-CM | POA: Insufficient documentation

## 2022-01-03 DIAGNOSIS — R42 Dizziness and giddiness: Secondary | ICD-10-CM | POA: Insufficient documentation

## 2022-01-03 DIAGNOSIS — I10 Essential (primary) hypertension: Secondary | ICD-10-CM | POA: Insufficient documentation

## 2022-01-03 LAB — URINALYSIS, ROUTINE W REFLEX MICROSCOPIC
Bilirubin Urine: NEGATIVE
Glucose, UA: NEGATIVE mg/dL
Hgb urine dipstick: NEGATIVE
Ketones, ur: NEGATIVE mg/dL
Leukocytes,Ua: NEGATIVE
Nitrite: NEGATIVE
Protein, ur: NEGATIVE mg/dL
Specific Gravity, Urine: 1.002 — ABNORMAL LOW (ref 1.005–1.030)
pH: 6 (ref 5.0–8.0)

## 2022-01-03 LAB — CBC
HCT: 42.4 % (ref 36.0–46.0)
Hemoglobin: 13.7 g/dL (ref 12.0–15.0)
MCH: 29.3 pg (ref 26.0–34.0)
MCHC: 32.3 g/dL (ref 30.0–36.0)
MCV: 90.6 fL (ref 80.0–100.0)
Platelets: 388 10*3/uL (ref 150–400)
RBC: 4.68 MIL/uL (ref 3.87–5.11)
RDW: 13.3 % (ref 11.5–15.5)
WBC: 9.3 10*3/uL (ref 4.0–10.5)
nRBC: 0 % (ref 0.0–0.2)

## 2022-01-03 LAB — BASIC METABOLIC PANEL
Anion gap: 8 (ref 5–15)
BUN: 13 mg/dL (ref 6–20)
CO2: 27 mmol/L (ref 22–32)
Calcium: 8.8 mg/dL — ABNORMAL LOW (ref 8.9–10.3)
Chloride: 105 mmol/L (ref 98–111)
Creatinine, Ser: 0.71 mg/dL (ref 0.44–1.00)
GFR, Estimated: >60 mL/min (ref >60)
Glucose, Bld: 111 mg/dL — ABNORMAL HIGH (ref 70–99)
Potassium: 3.4 mmol/L — ABNORMAL LOW (ref 3.5–5.1)
Sodium: 140 mmol/L (ref 135–145)

## 2022-01-03 LAB — TROPONIN I (HIGH SENSITIVITY)
Troponin I (High Sensitivity): 3 ng/L (ref <18)
Troponin I (High Sensitivity): 2 ng/L (ref <18)

## 2022-01-03 LAB — T4, FREE: Free T4: 0.74 ng/dL (ref 0.61–1.12)

## 2022-01-03 LAB — TSH: TSH: 1.245 u[IU]/mL (ref 0.350–4.500)

## 2022-01-03 MED ORDER — ACETAMINOPHEN 500 MG PO TABS
1000.0000 mg | ORAL_TABLET | Freq: Once | ORAL | Status: AC
  Administered 2022-01-03: 1000 mg via ORAL
  Filled 2022-01-03: qty 2

## 2022-01-03 MED ORDER — SODIUM CHLORIDE 0.9 % IV BOLUS
1000.0000 mL | Freq: Once | INTRAVENOUS | Status: AC
  Administered 2022-01-03: 1000 mL via INTRAVENOUS

## 2022-01-03 MED ORDER — METOCLOPRAMIDE HCL 5 MG/ML IJ SOLN
10.0000 mg | Freq: Once | INTRAMUSCULAR | Status: AC
  Administered 2022-01-03: 10 mg via INTRAVENOUS
  Filled 2022-01-03: qty 2

## 2022-01-03 NOTE — ED Triage Notes (Addendum)
Pt comes with c/o dizziness and headache. Pt states this all just started today. Pt states BP has been elevated. Pt states she does take BP meds. Pt state some pain across shoulders and up into her head.

## 2022-01-03 NOTE — Discharge Instructions (Addendum)
You were seen in the emergency department for headache and elevated blood pressure.  You had a CT scan of your head that did not show any bleeding.  Your lab work was overall normal.  Your blood pressure improved while in the emergency department.  Concerned that the stress that you are having in your life could be playing a role in your headache and your blood pressure.  Follow-up with your primary care physician to discuss further discussion with psychiatry or a therapist.  You need to have repeat blood pressure with your primary care doctor.  Return to the emergency department for worsening symptoms.

## 2022-01-03 NOTE — ED Notes (Signed)
Pt to ED for feeling lightheaded and nauseous since this AM. Pt alert, oriented. Has been eating, drinking. Denies any unilateral numbness or weakness.

## 2022-01-03 NOTE — ED Provider Notes (Signed)
[facility] Provider Note    Event Date/Time   First MD Initiated Contact with Patient 01/03/22 1327     (approximate)   History   Dizziness   HPI  Ashlee Everett is a 51 y.o. female patient is a 51 year old female with past medical history significant for hypertension who presents to the emergency department for high blood pressure and dizziness.  States that she was at work today and developed a mild headache.  Went to the nurse office and was told that she had an elevated blood pressure.  States that she had taken her oral medications this morning and has not missed any doses.  Takes 2 antihypertensive medications.  States that they checked her blood pressure 3 times and it was elevated and so she was told to come to the emergency department.  Endorses a mild headache and some pain in her upper back and neck.  Denies any nausea vomiting or diarrhea.  Denies any ongoing dizziness at this time.  Denies any chest pain or shortness of breath.  No recent falls or trauma.     Physical Exam   Triage Vital Signs: ED Triage Vitals  Enc Vitals Group     BP 01/03/22 1253 (!) 167/90     Pulse Rate 01/03/22 1253 90     Resp 01/03/22 1253 20     Temp 01/03/22 1253 97.8 F (36.6 C)     Temp Source 01/03/22 1253 Oral     SpO2 01/03/22 1253 98 %     Weight --      Height --      Head Circumference --      Peak Flow --      Pain Score 01/03/22 1218 9     Pain Loc --      Pain Edu? --      Excl. in [place]? --     Most recent vital signs: Vitals:   01/03/22 1600 01/03/22 1629  BP:    Pulse: 81   Resp: 19   Temp:  97.9 F (36.6 C)  SpO2: 99%      General: Awake, no distress.  CV:  Good peripheral perfusion.  Resp:  Normal effort.  Lungs clear to auscultation Abd:  No distention.  No abdominal tenderness to palpation Other:  Cranial nerves intact.  5/5 strength bilateral upper and lower extremities.  No nystagmus.   ED Results / Procedures /  Treatments   Labs (all labs ordered are listed, but only abnormal results are displayed) Labs Reviewed  BASIC METABOLIC PANEL - Abnormal; Notable for the following components:      Result Value   Potassium 3.4 (*)    Glucose, Bld 111 (*)    Calcium 8.8 (*)    All other components within normal limits  URINALYSIS, ROUTINE W REFLEX MICROSCOPIC - Abnormal; Notable for the following components:   Color, Urine COLORLESS (*)    APPearance CLEAR (*)    Specific Gravity, Urine 1.002 (*)    All other components within normal limits  CBC  TSH  T4, FREE  CBG MONITORING, ED  TROPONIN I (HIGH SENSITIVITY)  TROPONIN I (HIGH SENSITIVITY)     EKG     RADIOLOGY     PROCEDURES:  Critical Care performed: No  Procedures   MEDICATIONS ORDERED IN ED: Medications  sodium chloride 0.9 % bolus 1,000 mL (0 mLs Intravenous Stopped 01/03/22 1630)  acetaminophen (TYLENOL) tablet 1,000 mg (1,000 mg Oral Given 01/03/22  1527)  metoCLOPramide (REGLAN) injection 10 mg (10 mg Intravenous Given 01/03/22 1527)     IMPRESSION / MDM / ASSESSMENT AND PLAN / ED COURSE  I reviewed the triage vital signs and the nursing notes.   Differential diagnosis includes, but is not limited to, hypertensive emergency, anxiety, ACS, hyperthyroidism  No significant leukocytosis or anemia.  Creatinine appears to be at her baseline.  Thyroid studies within normal limits.  Troponin is undetectable.  Patient then started complaining of a headache and rated as a 9/10.  CT scan of her head obtained.  Independently reviewed with no signs of acute intracranial hemorrhage or infarction.  CT scan was read as no acute findings.  Treated with migraine cocktail with IV fluids, Reglan and Tylenol.  On reevaluation significant improvement of her headache.  Blood pressure spontaneously improved.  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   Patient states that she will follow-up closely with her primary care physician.  Given return precautions.  Patient's presentation is most consistent with acute presentation with potential threat to life or bodily function.       FINAL CLINICAL IMPRESSION(S) / ED DIAGNOSES   Final diagnoses:  Dizzy  Acute nonintractable headache, unspecified headache type  Stress     Rx / DC Orders   ED Discharge Orders     None        Note:  This document was prepared using Dragon voice recognition software and may include unintentional dictation errors.   Nathaniel Man, MD 01/03/22 1651

## 2023-05-07 ENCOUNTER — Other Ambulatory Visit: Payer: Self-pay

## 2023-05-07 ENCOUNTER — Encounter: Payer: Self-pay | Admitting: Emergency Medicine

## 2023-05-07 ENCOUNTER — Emergency Department
Admission: EM | Admit: 2023-05-07 | Discharge: 2023-05-07 | Disposition: A | Discharge status: Home / Self Care | Payer: 59 | Attending: Emergency Medicine | Admitting: Emergency Medicine

## 2023-05-07 ENCOUNTER — Emergency Department: Payer: 59

## 2023-05-07 DIAGNOSIS — M25562 Pain in left knee: Secondary | ICD-10-CM | POA: Diagnosis present

## 2023-05-07 DIAGNOSIS — I10 Essential (primary) hypertension: Secondary | ICD-10-CM | POA: Insufficient documentation

## 2023-05-07 MED ORDER — HYDROCODONE-ACETAMINOPHEN 5-325 MG PO TABS: 1.0000 | ORAL_TABLET | ORAL | 0 refills | Status: AC | PRN

## 2023-05-07 NOTE — ED Provider Notes (Signed)
[facility] Provider Note    Event Date/Time   First MD Initiated Contact with Patient 05/07/23 [phone]     (approximate)  History   Chief Complaint: Knee Pain  HPI  Ashlee Everett is a 53 y.o. female with a past medical history of gastric reflux, hypertension, presents to the emergency department for left knee pain.  According to the patient in September she injured her left knee, since that time she has been experiencing intermittent worsening pains to the left knee that come and go.  Patient states she has followed up with an orthopedist however never had an MRI performed only x-rays.  Was told that she needs a total knee replacement but that she needs to lose weight prior to them considering her for surgery.  Patient states she has not returned to that orthopedist for further evaluation.  She states on Saturday she was getting off the toilet when she had acute exacerbation of left knee pain.  Denies any falls or trauma.  She states since that time she has been experiencing increased pain having to use a cane when ambulating due to knee pain.  Has been taking extra strength Tylenol without relief so she came to the emergency department today.  Physical Exam   Triage Vital Signs: ED Triage Vitals  Encounter Vitals Group     BP 05/07/23 0907 (!) 147/66     Systolic BP Percentile --      Diastolic BP Percentile --      Pulse Rate 05/07/23 0907 77     Resp 05/07/23 0907 16     Temp 05/07/23 0907 98 F (36.7 C)     Temp Source 05/07/23 0907 Oral     SpO2 05/07/23 0907 95 %     Weight 05/07/23 0906 257 lb 15 oz (117 kg)     Height 05/07/23 0918 5\' 1"  (1.549 m)     Head Circumference --      Peak Flow --      Pain Score 05/07/23 0905 9     Pain Loc --      Pain Education --      Exclude from Growth Chart --     Most recent vital signs: Vitals:   05/07/23 0907  BP: (!) 147/66  Pulse: 77  Resp: 16  Temp: 98 F (36.7 C)  SpO2: 95%     General: Awake, no distress.  CV:  Good peripheral perfusion.  Resp:  Normal effort.  Abd:  No distention. Other:  No obvious joint effusion.  No obvious tenderness.  Exam is somewhat limited due to habitus.  Neurovascularly intact distally.   ED Results / Procedures / Treatments   RADIOLOGY  I have reviewed and interpreted the x-ray images no obvious fracture seen on my evaluation. Radiology has read the x-ray as mild to moderate osteoarthritis but no acute abnormality.   MEDICATIONS ORDERED IN ED: Medications - No data to display   IMPRESSION / MDM / ASSESSMENT AND PLAN / ED COURSE  I reviewed the triage vital signs and the nursing notes.  Patient's presentation is most consistent with exacerbation of chronic illness.  Patient presents to the emergency department acute on chronic left knee pain.  Patient is able to ambulate although with discomfort.  Will obtain an x-ray to rule out any obvious fracture and to evaluate for likelihood of advanced arthritis.  As long as the x-ray does not show any significant abnormality anticipate referral to orthopedics for  further evaluation as well as a short course of pain medication.  Patient's agreeable to this plan.  X-ray negative.  Will discharge with a short course of pain medication refer to [facility] per patient request.  FINAL CLINICAL IMPRESSION(S) / ED DIAGNOSES   Left knee pain   Note:  This document was prepared using Dragon voice recognition software and may include unintentional dictation errors.   Minna Antis, MD 05/07/23 1056

## 2023-05-07 NOTE — Discharge Instructions (Addendum)
Please call the number provided for orthopedics to arrange a follow-up appointment soon as possible.  Please use your cane when ambulating you may use your pain medication as needed but only as prescribed.  Do not drink alcohol or drive while taking this medication.

## 2023-05-07 NOTE — ED Triage Notes (Signed)
C/O left knee pain. States knee 'jerked' Saturday when she almost fell. C/O knee pain since that time.

## 2023-06-14 ENCOUNTER — Emergency Department

## 2023-06-14 ENCOUNTER — Other Ambulatory Visit: Payer: Self-pay

## 2023-06-14 ENCOUNTER — Emergency Department
Admission: EM | Admit: 2023-06-14 | Discharge: 2023-06-14 | Disposition: A | Discharge status: Home / Self Care | Attending: Emergency Medicine | Admitting: Emergency Medicine

## 2023-06-14 DIAGNOSIS — R0789 Other chest pain: Secondary | ICD-10-CM | POA: Diagnosis not present

## 2023-06-14 DIAGNOSIS — R079 Chest pain, unspecified: Secondary | ICD-10-CM | POA: Diagnosis present

## 2023-06-14 DIAGNOSIS — I10 Essential (primary) hypertension: Secondary | ICD-10-CM | POA: Diagnosis not present

## 2023-06-14 LAB — CBC
HCT: 44.5 % (ref 36.0–46.0)
Hemoglobin: 14.5 g/dL (ref 12.0–15.0)
MCH: 29.8 pg (ref 26.0–34.0)
MCHC: 32.6 g/dL (ref 30.0–36.0)
MCV: 91.6 fL (ref 80.0–100.0)
Platelets: 360 10*3/uL (ref 150–400)
RBC: 4.86 MIL/uL (ref 3.87–5.11)
RDW: 14.2 % (ref 11.5–15.5)
WBC: 10.6 10*3/uL — ABNORMAL HIGH (ref 4.0–10.5)
nRBC: 0 % (ref 0.0–0.2)

## 2023-06-14 LAB — BASIC METABOLIC PANEL
Anion gap: 12 (ref 5–15)
BUN: 13 mg/dL (ref 6–20)
CO2: 23 mmol/L (ref 22–32)
Calcium: 8.8 mg/dL — ABNORMAL LOW (ref 8.9–10.3)
Chloride: 102 mmol/L (ref 98–111)
Creatinine, Ser: 0.77 mg/dL (ref 0.44–1.00)
GFR, Estimated: >60 mL/min (ref >60)
Glucose, Bld: 121 mg/dL — ABNORMAL HIGH (ref 70–99)
Potassium: 3.6 mmol/L (ref 3.5–5.1)
Sodium: 137 mmol/L (ref 135–145)

## 2023-06-14 LAB — TROPONIN I (HIGH SENSITIVITY)
Troponin I (High Sensitivity): 2 ng/L (ref <18)
Troponin I (High Sensitivity): <2 ng/L (ref <18)

## 2023-06-14 LAB — HEPATIC FUNCTION PANEL
ALT: 24 U/L (ref 0–44)
AST: 17 U/L (ref 15–41)
Albumin: 3.4 g/dL — ABNORMAL LOW (ref 3.5–5.0)
Alkaline Phosphatase: 36 U/L — ABNORMAL LOW (ref 38–126)
Bilirubin, Direct: 0.2 mg/dL (ref 0.0–0.2)
Indirect Bilirubin: 0.9 mg/dL (ref 0.3–0.9)
Total Bilirubin: 1.1 mg/dL (ref 0.0–1.2)
Total Protein: 7.2 g/dL (ref 6.5–8.1)

## 2023-06-14 LAB — LIPASE, BLOOD: Lipase: 25 U/L (ref 11–51)

## 2023-06-14 MED ORDER — FAMOTIDINE 20 MG PO TABS
20.0000 mg | ORAL_TABLET | Freq: Once | ORAL | Status: AC
  Administered 2023-06-14: 20 mg via ORAL
  Filled 2023-06-14: qty 1

## 2023-06-14 MED ORDER — IOHEXOL 350 MG/ML SOLN
100.0000 mL | Freq: Once | INTRAVENOUS | Status: AC | PRN
  Administered 2023-06-14: 100 mL via INTRAVENOUS

## 2023-06-14 MED ORDER — ALUM & MAG HYDROXIDE-SIMETH 200-200-20 MG/5ML PO SUSP
15.0000 mL | Freq: Once | ORAL | Status: AC
  Administered 2023-06-14: 15 mL via ORAL
  Filled 2023-06-14: qty 30

## 2023-06-14 MED ORDER — KETOROLAC TROMETHAMINE 30 MG/ML IJ SOLN
15.0000 mg | Freq: Once | INTRAMUSCULAR | Status: AC
  Administered 2023-06-14: 15 mg via INTRAVENOUS
  Filled 2023-06-14: qty 1

## 2023-06-14 NOTE — ED Provider Notes (Signed)
 [facility] Provider Note    Event Date/Time   First MD Initiated Contact with Patient 06/14/23 1321     (approximate)   History   Chest Pain   HPI  Ashlee Everett is a 53 y.o. female with a history of hypertension who presents with chest pain, acute onset yesterday afternoon, persistent course since then, 9/10 in intensity currently, radiating to her back, as well as some pain in the epigastric region.  She reports associated nausea but no vomiting.  She has no shortness of breath.  She does feel slightly lightheaded.  She denies any prior history of this pain.  She denies any leg pain or swelling.  I reviewed the past medical records.  The patient was seen in the ED on 2/3 for knee pain.  Her most recent outpatient encounter was on 12/17 with internal medicine for a wellness exam.   Physical Exam   Triage Vital Signs: ED Triage Vitals  Encounter Vitals Group     BP 06/14/23 1029 (!) 143/55     Systolic BP Percentile --      Diastolic BP Percentile --      Pulse Rate 06/14/23 1029 78     Resp 06/14/23 1029 20     Temp 06/14/23 1029 98.5 F (36.9 C)     Temp Source 06/14/23 1029 Oral     SpO2 06/14/23 1029 93 %     Weight 06/14/23 1028 277 lb (125.6 kg)     Height 06/14/23 1028 5\' 1"  (1.549 m)     Head Circumference --      Peak Flow --      Pain Score 06/14/23 1028 8     Pain Loc --      Pain Education --      Exclude from Growth Chart --     Most recent vital signs: Vitals:   06/14/23 1029 06/14/23 1450  BP: (!) 143/55 (!) 146/79  Pulse: 78 67  Resp: 20 18  Temp: 98.5 F (36.9 C)   SpO2: 93% 100%    General: Alert, well-appearing, no distress.  CV:  Good peripheral perfusion.  Resp:  Normal effort.  Lungs CTAB. Abd:  No distention.  Other:  No calf or popliteal swelling or tenderness.  No peripheral edema.   ED Results / Procedures / Treatments   Labs (all labs ordered are listed, but only abnormal results are  displayed) Labs Reviewed  BASIC METABOLIC PANEL - Abnormal; Notable for the following components:      Result Value   Glucose, Bld 121 (*)    Calcium 8.8 (*)    All other components within normal limits  CBC - Abnormal; Notable for the following components:   WBC 10.6 (*)    All other components within normal limits  HEPATIC FUNCTION PANEL - Abnormal; Notable for the following components:   Albumin 3.4 (*)    Alkaline Phosphatase 36 (*)    All other components within normal limits  LIPASE, BLOOD  TROPONIN I (HIGH SENSITIVITY)  TROPONIN I (HIGH SENSITIVITY)     EKG  ED ECG REPORT I, Dionne Bucy, the attending physician, personally viewed and interpreted this ECG.  Date: 06/14/2023 EKG Time: 1027 Rate: 71 Rhythm: normal sinus rhythm QRS Axis: normal Intervals: normal ST/T Wave abnormalities: ST abnormalities Narrative Interpretation: no evidence of acute ischemia; no significant change when compared to EKG from 01/20/2022    RADIOLOGY  Chest x-ray: I independently viewed and interpreted the  images; there is no focal consolidation or edema  CT angio chest/abdomen/pelvis: Pending  PROCEDURES:  Critical Care performed: No  Procedures   MEDICATIONS ORDERED IN ED: Medications  famotidine (PEPCID) tablet 20 mg (20 mg Oral Given 06/14/23 1344)  alum & mag hydroxide-simeth (MAALOX/MYLANTA) 200-200-20 MG/5ML suspension 15 mL (15 mLs Oral Given 06/14/23 1344)  iohexol (OMNIPAQUE) 350 MG/ML injection 100 mL (100 mLs Intravenous Contrast Given 06/14/23 1414)  ketorolac (TORADOL) 30 MG/ML injection 15 mg (15 mg Intravenous Given 06/14/23 1459)     IMPRESSION / MDM / ASSESSMENT AND PLAN / ED COURSE  I reviewed the triage vital signs and the nursing notes.  53 year old female with PMH as noted above presents with chest pain since yesterday which radiates to her back.  She is slightly hypertensive with otherwise normal vital signs and overall well-appearing.  Physical  exam is unremarkable for other acute findings.  Differential diagnosis includes, but is not limited to, GERD, gastritis, musculoskeletal chest pain, less likely ACS.  I have a lower suspicion for aortic dissection or other vascular cause but given the constant 9/10 pain since yesterday and the radiation to the back, we will obtain a CT to rule out dissection.  There is no clinical evidence for PE.  Patient's presentation is most consistent with acute complicated illness / injury requiring diagnostic workup.  ----------------------------------------- 3:20 PM on 06/14/2023 -----------------------------------------  Repeat troponin is negative.  CTA is pending.  I have handed off the patient to the oncoming ED physician Dr. Rosalia Hammers.   FINAL CLINICAL IMPRESSION(S) / ED DIAGNOSES   Final diagnoses:  Atypical chest pain     Rx / DC Orders   ED Discharge Orders     None        Note:  This document was prepared using Dragon voice recognition software and may include unintentional dictation errors.    Dionne Bucy, MD 06/14/23 1520

## 2023-06-14 NOTE — Discharge Instructions (Signed)
 You were seen in the Emergency Department today for evaluation of your chest pain. Fortunately, your labs, EKG, and CT scan were overall reassuring against a emergency cause for your pain.  Please follow-up with cardiology new primary care doctor for further evaluation. Return to the ER for any new or worsening symptoms including worsening chest pain, difficulty breathing, or any other new or concerning symptoms that you believe warrants immediate attention.

## 2023-06-14 NOTE — ED Triage Notes (Signed)
 Patient states chest pain that radiates to back, had episode of diaphoresis PTA.

## 2023-06-14 NOTE — ED Notes (Signed)
 Patient transported to CT

## 2023-06-14 NOTE — ED Provider Notes (Signed)
 Care of this patient assumed from prior physician at 1500 pending CT and disposition. Please see prior physician note for further details.  Briefly this is a 53 year old female who presented with chest pain with radiation to her back.  Labs including troponin x 2 reassuring.  Radiology read on CT dissection protocol pending at time of signout.  No obvious dissection on my review.  Anticipated discharge if this is reassuring.  5:22 PM Radiology read resulted without evidence of dissection or other acute finding.  Patient reassessed and updated on results of workup.  Discussed discharge with outpatient follow-up and patient is comfortable with this plan.  Has previously seen cardiology, will place referral for follow-up with them.  Strict return precautions provided.  Patient discharged in stable condition.    Trinna Post, MD 06/14/23 1728

## 2023-11-30 ENCOUNTER — Other Ambulatory Visit: Payer: Self-pay

## 2023-11-30 ENCOUNTER — Emergency Department

## 2023-11-30 ENCOUNTER — Emergency Department
Admission: EM | Admit: 2023-11-30 | Discharge: 2023-11-30 | Disposition: A | Discharge status: Home / Self Care | Attending: Emergency Medicine | Admitting: Emergency Medicine

## 2023-11-30 DIAGNOSIS — H81399 Other peripheral vertigo, unspecified ear: Secondary | ICD-10-CM | POA: Diagnosis not present

## 2023-11-30 DIAGNOSIS — I10 Essential (primary) hypertension: Secondary | ICD-10-CM | POA: Insufficient documentation

## 2023-11-30 DIAGNOSIS — R42 Dizziness and giddiness: Secondary | ICD-10-CM | POA: Diagnosis present

## 2023-11-30 LAB — CBC WITH DIFFERENTIAL/PLATELET
Abs Immature Granulocytes: 0.04 K/uL (ref 0.00–0.07)
Basophils Absolute: 0.1 K/uL (ref 0.0–0.1)
Basophils Relative: 1 %
Eosinophils Absolute: 0.2 K/uL (ref 0.0–0.5)
Eosinophils Relative: 2 %
HCT: 39.8 % (ref 36.0–46.0)
Hemoglobin: 12.8 g/dL (ref 12.0–15.0)
Immature Granulocytes: 0 %
Lymphocytes Relative: 23 %
Lymphs Abs: 2.3 K/uL (ref 0.7–4.0)
MCH: 29.2 pg (ref 26.0–34.0)
MCHC: 32.2 g/dL (ref 30.0–36.0)
MCV: 90.7 fL (ref 80.0–100.0)
Monocytes Absolute: 0.7 K/uL (ref 0.1–1.0)
Monocytes Relative: 7 %
Neutro Abs: 6.9 K/uL (ref 1.7–7.7)
Neutrophils Relative %: 67 %
Platelets: 323 K/uL (ref 150–400)
RBC: 4.39 MIL/uL (ref 3.87–5.11)
RDW: 13.9 % (ref 11.5–15.5)
WBC: 10.2 K/uL (ref 4.0–10.5)
nRBC: 0 % (ref 0.0–0.2)

## 2023-11-30 LAB — BASIC METABOLIC PANEL WITH GFR
Anion gap: 10 (ref 5–15)
BUN: 19 mg/dL (ref 6–20)
CO2: 27 mmol/L (ref 22–32)
Calcium: 8.8 mg/dL — ABNORMAL LOW (ref 8.9–10.3)
Chloride: 103 mmol/L (ref 98–111)
Creatinine, Ser: 0.49 mg/dL (ref 0.44–1.00)
GFR, Estimated: >60 mL/min (ref >60)
Glucose, Bld: 140 mg/dL — ABNORMAL HIGH (ref 70–99)
Potassium: 4 mmol/L (ref 3.5–5.1)
Sodium: 140 mmol/L (ref 135–145)

## 2023-11-30 MED ORDER — MECLIZINE HCL 25 MG PO TABS
25.0000 mg | ORAL_TABLET | Freq: Once | ORAL | Status: AC
  Administered 2023-11-30: 25 mg via ORAL
  Filled 2023-11-30: qty 1

## 2023-11-30 MED ORDER — PSEUDOEPHEDRINE HCL 60 MG PO TABS: 60.0000 mg | ORAL_TABLET | Freq: Four times a day (QID) | ORAL | 0 refills | Status: AC | PRN

## 2023-11-30 MED ORDER — DIPHENHYDRAMINE HCL 50 MG/ML IJ SOLN
25.0000 mg | Freq: Once | INTRAMUSCULAR | Status: AC
  Administered 2023-11-30: 25 mg via INTRAVENOUS
  Filled 2023-11-30: qty 1

## 2023-11-30 MED ORDER — MECLIZINE HCL 25 MG PO TABS: 25.0000 mg | ORAL_TABLET | Freq: Three times a day (TID) | ORAL | 0 refills | Status: AC | PRN

## 2023-11-30 MED ORDER — METOCLOPRAMIDE HCL 5 MG/ML IJ SOLN
10.0000 mg | Freq: Once | INTRAMUSCULAR | Status: AC
  Administered 2023-11-30: 10 mg via INTRAVENOUS
  Filled 2023-11-30: qty 2

## 2023-11-30 NOTE — ED Provider Notes (Signed)
 [facility] Provider Note    Event Date/Time   First MD Initiated Contact with Patient 11/30/23 1715     (approximate)   History   Dizziness   HPI  Ashlee Everett is a 53 y.o. female with a history of obesity, prediabetes, dyslipidemia, SVT, hypertension, and OSA who presents with dizziness, acute onset this afternoon, severe intensity, and described as a sensation of the room spinning.  It occurred when she quickly got up.  It is now worse with movement or trying to stand up, and improves if she lies still in bed.  She reports associated nausea but no vomiting.  She denies any vision changes.  She does have a headache.  She states that she feels somewhat lightheaded as well.  She denies any weakness or numbness in her arms or legs.  She has no chest pain or difficulty breathing.  She denies any tinnitus, ear pain, or congestion.  She states that she had an episode somewhat like this but much milder in the past.  Reviewed the past medical records.  The patient's most recent outpatient encounter was with gastroenterology on 7/29 for follow-up of chronic conditions.   Physical Exam   Triage Vital Signs: ED Triage Vitals  Encounter Vitals Group     BP 11/30/23 1711 (!) 153/86     Girls Systolic BP Percentile --      Girls Diastolic BP Percentile --      Boys Systolic BP Percentile --      Boys Diastolic BP Percentile --      Pulse Rate 11/30/23 1711 85     Resp 11/30/23 1711 18     Temp 11/30/23 1711 98.6 F (37 C)     Temp Source 11/30/23 1711 Oral     SpO2 11/30/23 1711 97 %     Weight 11/30/23 1714 281 lb (127.5 kg)     Height 11/30/23 1714 5' 1 (1.549 m)     Head Circumference --      Peak Flow --      Pain Score 11/30/23 1712 8     Pain Loc --      Pain Education --      Exclude from Growth Chart --     Most recent vital signs: Vitals:   11/30/23 2127 11/30/23 2300  BP: 129/68 122/65  Pulse: 70 67  Resp: (!) 23 20  Temp: 98.3 F  (36.8 C) 98.3 F (36.8 C)  SpO2: 95% 95%     General: Alert, no distress.  CV:  Good peripheral perfusion.  Resp:  Normal effort.  Abd:  No distention.  Other:  EOMI.  PERRLA.  No photophobia.  No facial droop.  Normal speech.  5/5 motor strength and intact sensation to all extremities.  No ataxia on finger-to-nose.  No pronator drift.  Bilateral ear canals and TMs appear normal.   ED Results / Procedures / Treatments   Labs (all labs ordered are listed, but only abnormal results are displayed) Labs Reviewed  BASIC METABOLIC PANEL WITH GFR - Abnormal; Notable for the following components:      Result Value   Glucose, Bld 140 (*)    Calcium 8.8 (*)    All other components within normal limits  CBC WITH DIFFERENTIAL/PLATELET     EKG  ED ECG REPORT I, Waylon Cassis, the attending physician, personally viewed and interpreted this ECG.  Date: 11/30/2023 EKG Time: 1714 Rate: 83 Rhythm: normal sinus rhythm QRS Axis:  normal Intervals: normal ST/T Wave abnormalities: normal Narrative Interpretation: no evidence of acute ischemia    RADIOLOGY  CT head: I independently viewed and interpreted the images; there is no ICH.  Radiology report indicates the following:  IMPRESSION:  Negative non contrasted CT appearance of the brain.   MR brain:  IMPRESSION:  1. No acute intracranial abnormality.  2. Few scattered subcentimeter foci of T2/FLAIR hyperintensities  involving the supratentorial cerebral white matter, nonspecific, but  most commonly related to chronic microvascular ischemic disease.  Overall, changes are extremely mild for age.  3. Moderate to large bilateral mastoid effusions, of uncertain  significance. Correlation with physical exam recommended.    PROCEDURES:  Critical Care performed: No  Procedures   MEDICATIONS ORDERED IN ED: Medications  meclizine  (ANTIVERT ) tablet 25 mg (25 mg Oral Given 11/30/23 1749)  metoCLOPramide  (REGLAN ) injection  10 mg (10 mg Intravenous Given 11/30/23 2006)  diphenhydrAMINE  (BENADRYL ) injection 25 mg (25 mg Intravenous Given 11/30/23 2006)     IMPRESSION / MDM / ASSESSMENT AND PLAN / ED COURSE  I reviewed the triage vital signs and the nursing notes.  53 year old female with PMH as noted above presents with acute onset of vertigo which is worse with position changes and extinguishes at rest.  Thorough neurologic exam is nonfocal.  Differential diagnosis includes, but is not limited to, peripheral vertigo, less likely central vertigo.  Given the acute time course, positional nature of the symptoms, and the reassuring neuroexam, I have a very low suspicion for CNS etiology.  We will obtain basic labs, CT head, give meclizine , and reassess.  Patient's presentation is most consistent with acute complicated illness / injury requiring diagnostic workup.  ----------------------------------------- 10:53 PM on 11/30/2023 -----------------------------------------  CT head was negative.  However, the patient remains symptomatic after meclizine .  We obtained an MRI which is also negative for findings of acute stroke.  There are mastoid effusions bilaterally.  On exam the patient does not have any significant mastoid tenderness.  Her vertigo has significantly improved after Reglan  and Benadryl .  At this time, the patient is stable for discharge home.  I have prescribed meclizine  as well as pseudoephedrine  in case the vertigo is being caused by labyrinthitis.  I gave the patient strict return precautions, and she expressed understanding.  FINAL CLINICAL IMPRESSION(S) / ED DIAGNOSES   Final diagnoses:  Peripheral vertigo, unspecified laterality     Rx / DC Orders   ED Discharge Orders          Ordered    meclizine  (ANTIVERT ) 25 MG tablet  3 times daily PRN        11/30/23 2251    pseudoephedrine  (SUDAFED) 60 MG tablet  Every 6 hours PRN        11/30/23 2251             Note:  This document was  prepared using Dragon voice recognition software and may include unintentional dictation errors.    Jacolyn Pae, MD 12/01/23 [phone]

## 2023-11-30 NOTE — ED Notes (Signed)
 Introduced self to pt. Pt states headache is 7/10. Will notify provider. Call bell in reach. Pt has no further needs at this time.

## 2023-11-30 NOTE — ED Notes (Signed)
 Pt given DC instructions by this RN. Pt verbalized understanding of medication and follow up care. Pt ambulatory from ED without difficulty. NAD noted at departure.

## 2023-11-30 NOTE — ED Notes (Signed)
 Danajai the ED tech and this ED tech both assisted pt to the bathroom in room and back to the bed at this time. Pt is back in bed with call bell by bedside and family by bedside with pt.

## 2023-11-30 NOTE — ED Triage Notes (Signed)
 Pt to ED for c/o dizziness that began about 1600. Pt states room was spinning and she had to hold the wall to walk. Pt also c/o headache.

## 2023-11-30 NOTE — Discharge Instructions (Signed)
 Take the meclizine  as needed for the dizziness.  You can take the pseudoephedrine  as needed to help decrease congestion or fluid in the middle ear.  Make an appointment to follow-up with the ENT.  In the meantime, return to the ER immediately for new, worsening, or persistent severe dizziness, unsteadiness on your feet, nausea or vomiting, or any other new or worsening symptoms that concern you.

## 2023-12-02 ENCOUNTER — Emergency Department
Admission: EM | Admit: 2023-12-02 | Discharge: 2023-12-02 | Disposition: A | Discharge status: Home / Self Care | Attending: Emergency Medicine | Admitting: Emergency Medicine

## 2023-12-02 ENCOUNTER — Other Ambulatory Visit: Payer: Self-pay

## 2023-12-02 DIAGNOSIS — R42 Dizziness and giddiness: Secondary | ICD-10-CM | POA: Diagnosis present

## 2023-12-02 DIAGNOSIS — I10 Essential (primary) hypertension: Secondary | ICD-10-CM | POA: Diagnosis not present

## 2023-12-02 LAB — CBC WITH DIFFERENTIAL/PLATELET
Abs Immature Granulocytes: 0.04 K/uL (ref 0.00–0.07)
Basophils Absolute: 0.1 K/uL (ref 0.0–0.1)
Basophils Relative: 1 %
Eosinophils Absolute: 0.2 K/uL (ref 0.0–0.5)
Eosinophils Relative: 2 %
HCT: 40.8 % (ref 36.0–46.0)
Hemoglobin: 13.2 g/dL (ref 12.0–15.0)
Immature Granulocytes: 0 %
Lymphocytes Relative: 28 %
Lymphs Abs: 2.7 K/uL (ref 0.7–4.0)
MCH: 29 pg (ref 26.0–34.0)
MCHC: 32.4 g/dL (ref 30.0–36.0)
MCV: 89.7 fL (ref 80.0–100.0)
Monocytes Absolute: 0.8 K/uL (ref 0.1–1.0)
Monocytes Relative: 9 %
Neutro Abs: 5.7 K/uL (ref 1.7–7.7)
Neutrophils Relative %: 60 %
Platelets: 346 K/uL (ref 150–400)
RBC: 4.55 MIL/uL (ref 3.87–5.11)
RDW: 13.9 % (ref 11.5–15.5)
WBC: 9.5 K/uL (ref 4.0–10.5)
nRBC: 0 % (ref 0.0–0.2)

## 2023-12-02 LAB — COMPREHENSIVE METABOLIC PANEL WITH GFR
ALT: 15 U/L (ref 0–44)
AST: 18 U/L (ref 15–41)
Albumin: 3.4 g/dL — ABNORMAL LOW (ref 3.5–5.0)
Alkaline Phosphatase: 36 U/L — ABNORMAL LOW (ref 38–126)
Anion gap: 8 (ref 5–15)
BUN: 15 mg/dL (ref 6–20)
CO2: 28 mmol/L (ref 22–32)
Calcium: 8.8 mg/dL — ABNORMAL LOW (ref 8.9–10.3)
Chloride: 103 mmol/L (ref 98–111)
Creatinine, Ser: 0.35 mg/dL — ABNORMAL LOW (ref 0.44–1.00)
GFR, Estimated: >60 mL/min (ref >60)
Glucose, Bld: 93 mg/dL (ref 70–99)
Potassium: 3.7 mmol/L (ref 3.5–5.1)
Sodium: 139 mmol/L (ref 135–145)
Total Bilirubin: 1 mg/dL (ref 0.0–1.2)
Total Protein: 7.3 g/dL (ref 6.5–8.1)

## 2023-12-02 LAB — TROPONIN I (HIGH SENSITIVITY): Troponin I (High Sensitivity): 3 ng/L (ref <18)

## 2023-12-02 MED ORDER — ACETAMINOPHEN 500 MG PO TABS
1000.0000 mg | ORAL_TABLET | Freq: Once | ORAL | Status: AC
  Administered 2023-12-02: 1000 mg via ORAL
  Filled 2023-12-02: qty 2

## 2023-12-02 MED ORDER — SODIUM CHLORIDE 0.9 % IV BOLUS
1000.0000 mL | Freq: Once | INTRAVENOUS | Status: AC
  Administered 2023-12-02: 1000 mL via INTRAVENOUS

## 2023-12-02 MED ORDER — METOCLOPRAMIDE HCL 5 MG/ML IJ SOLN
10.0000 mg | Freq: Once | INTRAMUSCULAR | Status: AC
  Administered 2023-12-02: 10 mg via INTRAVENOUS
  Filled 2023-12-02: qty 2

## 2023-12-02 MED ORDER — MECLIZINE HCL 25 MG PO TABS
50.0000 mg | ORAL_TABLET | Freq: Once | ORAL | Status: AC
  Administered 2023-12-02: 50 mg via ORAL
  Filled 2023-12-02: qty 2

## 2023-12-02 NOTE — ED Provider Notes (Signed)
 [facility] Provider Note    Event Date/Time   First MD Initiated Contact with Patient 12/02/23 1154     (approximate)   History   Dizziness   HPI  Ashlee Everett is a 53 y.o. female with history of hypertension, IBS, presenting with intermittent dizziness.  States that this mixture of room spinning and lightheadedness.  She denies any vision changes or weakness or numbness.  States that symptoms are similar to when she was evaluated on 29 August.  No chest pain or shortness of breath, no urinary symptoms, no infectious symptoms.  Does have a left-sided headache.  No ear pain.  On independent chart review, she had an MRI done on 29 August that did not show any acute intracranial abnormalities, did show bilateral mastoid effusions.     Physical Exam   Triage Vital Signs: ED Triage Vitals  Encounter Vitals Group     BP 12/02/23 1144 (!) 158/86     Girls Systolic BP Percentile --      Girls Diastolic BP Percentile --      Boys Systolic BP Percentile --      Boys Diastolic BP Percentile --      Pulse Rate 12/02/23 1144 73     Resp 12/02/23 1144 18     Temp 12/02/23 1144 98.2 F (36.8 C)     Temp Source 12/02/23 1144 Oral     SpO2 12/02/23 1144 97 %     Weight 12/02/23 1144 281 lb (127.5 kg)     Height 12/02/23 1144 5' 1 (1.549 m)     Head Circumference --      Peak Flow --      Pain Score 12/02/23 1155 7     Pain Loc --      Pain Education --      Exclude from Growth Chart --     Most recent vital signs: Vitals:   12/02/23 1330 12/02/23 1400  BP: (!) 145/82 120/81  Pulse: 62 (!) 54  Resp: 19 19  Temp:    SpO2: 95% 98%     General: Awake, no distress.  CV:  Good peripheral perfusion.  Resp:  Normal effort.  Abd:  No distention.  Soft nontender Other:  Pupils are equal and reactive, extraocular moods are intact, no cranial nerve deficits, no focal weakness or numbness, was able to get her up to ambulate, she is not ataxic.   ED  Results / Procedures / Treatments   Labs (all labs ordered are listed, but only abnormal results are displayed) Labs Reviewed  COMPREHENSIVE METABOLIC PANEL WITH GFR - Abnormal; Notable for the following components:      Result Value   Creatinine, Ser 0.35 (*)    Calcium 8.8 (*)    Albumin 3.4 (*)    Alkaline Phosphatase 36 (*)    All other components within normal limits  CBC WITH DIFFERENTIAL/PLATELET  TROPONIN I (HIGH SENSITIVITY)     EKG  EKG shows, sinus rhythm, rate 74, normal QRS, normal QTc, no obvious ischemic ST elevation, T wave inversion to 3, T wave version new compared to prior     PROCEDURES:  Critical Care performed: No  Procedures   MEDICATIONS ORDERED IN ED: Medications  sodium chloride  0.9 % bolus 1,000 mL (1,000 mLs Intravenous New Bag/Given 12/02/23 1235)  acetaminophen  (TYLENOL ) tablet 1,000 mg (1,000 mg Oral Given 12/02/23 1229)  metoCLOPramide  (REGLAN ) injection 10 mg (10 mg Intravenous Given 12/02/23 1230)  meclizine  (ANTIVERT ) tablet 50 mg (50 mg Oral Given 12/02/23 1229)     IMPRESSION / MDM / ASSESSMENT AND PLAN / ED COURSE  I reviewed the triage vital signs and the nursing notes.                              Differential diagnosis includes, but is not limited to, peripheral vertigo, electrolyte derangements, mild dehydration, arrhythmia, atypical ACS.  For her headache, considered tension headache, migraine, no focal deficits on exam to suggest CVA.  Will get labs, EKG, troponin, IV fluids, migraine cocktail, meclizine .  Patient's presentation is most consistent with acute presentation with potential threat to life or bodily function.  Independent interpretation of labs below.  Labs are reassuring, patient is feel a lot better after symptomatic control and IV fluids.  Considered but no indication for inpatient admission at this time she safe for outpatient management.  Will discharge with strict return precautions.  Will also give her number  to call for neurology to schedule follow-up outpatient.  She has meclizine  at home that she can take.  Encouraged hydration.  Shared decision making to patient and she is agreeable with this plan.  Discharge.  The patient is on the cardiac monitor to evaluate for evidence of arrhythmia and/or significant heart rate changes.   Clinical Course as of 12/02/23 1420  Sun Dec 02, 2023  1252 Independent review of labs, electrolytes are severely deranged, troponin is not elevated, no leukocytosis. [TT]  1334 On reassessment patient is feeling a lot better, will let her finish her fluids, discussed with her about following up with neurology outpatient, she might need referral to vestibular PT. [TT]    Clinical Course User Index [TT] Waymond, Lorelle Cummins, MD     FINAL CLINICAL IMPRESSION(S) / ED DIAGNOSES   Final diagnoses:  Dizziness     Rx / DC Orders   ED Discharge Orders     None        Note:  This document was prepared using Dragon voice recognition software and may include unintentional dictation errors.    Waymond Lorelle Cummins, MD 12/02/23 [phone]

## 2023-12-02 NOTE — ED Triage Notes (Addendum)
 Pt c/o vertigo that started again this morning, was here for same yesterday and was told to come back if s/s continued for possible admission. CAOX4, strength equal bilaterally, pt denies numbness. Pt reports headache and fatigue. Pt was told mastoids are full of fluid.

## 2023-12-02 NOTE — ED Notes (Signed)
Pt up to use restroom, one person assist for safety

## 2023-12-02 NOTE — Discharge Instructions (Signed)
 Please take meclizine  as needed for your dizziness.  Please be sure to keep yourself hydrated.  Please make sure to follow-up with neurology, you might need a referral to vestibular physical therapy for further management of your intermittent dizziness.

## 2023-12-12 ENCOUNTER — Ambulatory Visit: Attending: Family Medicine

## 2023-12-12 DIAGNOSIS — R42 Dizziness and giddiness: Secondary | ICD-10-CM | POA: Insufficient documentation

## 2023-12-12 NOTE — Therapy (Signed)
 OUTPATIENT PHYSICAL THERAPY VESTIBULAR EVALUATION  Patient Name: Ashlee Everett MRN: [id number] DOB:1970/10/23, 53 y.o., female Today's Date: 12/17/2023  END OF SESSION:  PT End of Session - 12/17/23 0816     Visit Number 1    Number of Visits 9    Date for PT Re-Evaluation 02/06/24    Authorization Type eval: 12/12/23    PT Start Time 1018    PT Stop Time 1100    PT Time Calculation (min) 42 min    Activity Tolerance Patient tolerated treatment well    Behavior During Therapy [facility] for tasks assessed/performed         Past Medical History:  Diagnosis Date   Anginal pain (HCC) 2017   Cancer (HCC) 2016   BASAL CELL   Diverticulitis    GERD (gastroesophageal reflux disease)    Headache    History of hiatal hernia    SMALL   Hypertension    IBS (irritable bowel syndrome)    Paroxysmal SVT (supraventricular tachycardia) (HCC)    Sleep apnea    USES CPAP   Past Surgical History:  Procedure Laterality Date   ABDOMINAL HYSTERECTOMY     APPENDECTOMY     CHOLECYSTECTOMY N/A 06/21/2017   Procedure: LAPAROSCOPIC CHOLECYSTECTOMY;  Surgeon: Rodolph Romano, MD;  Location: [facility] ORS;  Service: General;  Laterality: N/A;   ESOPHAGOGASTRODUODENOSCOPY (EGD) WITH PROPOFOL  N/A 06/11/2017   Procedure: ESOPHAGOGASTRODUODENOSCOPY (EGD) WITH PROPOFOL ;  Surgeon: Gaylyn Gladis PENNER, MD;  Location: [facility] ENDOSCOPY;  Service: Endoscopy;  Laterality: N/A;   LAPAROSCOPIC LYSIS OF ADHESIONS     SALPINGOOPHORECTOMY Left    SIGMOID RESECTION / RECTOPEXY     TUBAL LIGATION     Patient Active Problem List   Diagnosis Date Noted   Chest pain 02/16/2015   Elevated troponin 02/16/2015   Hypertension 02/16/2015   Paroxysmal SVT (supraventricular tachycardia) (HCC) 02/16/2015   PCP: Diedra Lame, MD  REFERRING PROVIDER: Diedra Lame, MD   REFERRING DIAG: H81.10 (ICD-10-CM) - Benign paroxysmal vertigo, unspecified ear   RATIONALE FOR EVALUATION AND TREATMENT: Rehabilitation  THERAPY  DIAG: Dizziness and giddiness  ONSET DATE: 11/30/23  FOLLOW-UP APPT SCHEDULED WITH REFERRING PROVIDER: No, nothing scheduled;   SUBJECTIVE:   Chief Complaint:  Dizziness  Pertinent History Pt arrives with complaints of dizziness which started two Fridays ago when she stood up from the couch. She laid back down on her L side and started having vertigo. Her dizziness persisted for a few hours and It has not let up a whole lot since then. Pt reports intermittent episodes of vertigo/dizziness/lightheadedness 2-3 times per day. Occasionally she feels like she is going to pass out/black out. Symptoms last for approximately 1 minute. She has a remote history of vertigo (approximately 5 years ago). At that time she went to the ER and was given meds and her symptoms resolved. She is taking meclizine  3 times per day and is now on a Prednisone taper as well (took Meclizine  this morning). She denies any improvement on the steroid taper. She continues to complain of aural fullness and occasional tinnitus bilaterally. MRI in the ER showed no acute intracranial changes but moderate to large bilateral mastoid effusions. PMH includes hypertension, IBS, recent L knee meniscus repair. No recent medication changes prior to symptom onset. No chest pain, ear pain, or shortness of breath.   11/30/23: Brain MRI: IMPRESSION: 1. No acute intracranial abnormality. 2. Few scattered subcentimeter foci of T2/FLAIR hyperintensities involving the supratentorial cerebral white matter, nonspecific, but most commonly related  to chronic microvascular ischemic disease. Overall, changes are extremely mild for age. 3. Moderate to large bilateral mastoid effusions, of uncertain significance. Correlation with physical exam recommended.  Description of dizziness: vertigo, dizziness, lightheadedness Frequency: 2-3x/day Duration: initially hours, now only 1 minutel Symptom nature: intermittent Progression of symptoms since onset:  no change History of similar episodes: Yes, vertigo approximately 5 years ago  Provocative Factors:  laying down, supine to sit, standing up quickly; Easing Factors: waiting for symptoms to pass  Auditory complaints (tinnitus, pain, drainage, hearing loss, aural fullness): Yes aural fullness, tinnitus, hearing difficulty (worse on the R side), R ear pain, denies drainage; Vision changes (diplopia, visual field loss, recent changes, recent eye exam): No Chest pain/palpitations: No History of head injury/concussion: No Stress/anxiety: Yes, recently lost her job because of her L knee pain, recent passing of her mother; Migraines/headaches: Yes, none recently. Very remote history of migraines; Nausea/vomiting: Yes, nausea but no vomiting Numbness/tingling: No Focal weakness: No Dysarthria/dysphagia/drop attacks: No  Has patient fallen in last 6 months? No Pertinent pain: Yes, neck pain and bilateral shoulder stiffness Dominant hand: right Imaging: Yes, see history; Prior level of function: Independent Occupational demands: Not currently working Hobbies: reading  [employer]: Positive for basal cell carcinoma, Negative for chills/fever, night sweats, unexplained weight loss/gain  PRECAUTIONS: None  WEIGHT BEARING RESTRICTIONS No  LIVING ENVIRONMENT: Lives with: lives with their family Lives in: Mobile home, 8 steps to enter, bilateral narrow rails;  PATIENT GOALS Decrease symptoms   OBJECTIVE EXAMINATION  POSTURE: No gross deficits contributing to symptoms  NEUROLOGICAL SCREEN: (2+ unless otherwise noted.) N=normal  Ab=abnormal  Level Dermatome R L Myotome R L Reflex R L  C3 Anterior Neck N N Sidebend C2-3 N N Jaw CN V    C4 Top of Shoulder N N Shoulder Shrug C4 N N Hoffman's UMN    C5 Lateral Upper Arm N N Shoulder ABD C4-5 N N Biceps C5-6    C6 Lateral Arm/ Thumb N N Arm Flex/ Wrist Ext C5-6 N N Brachiorad. C5-6    C7 Middle Finger N N Arm Ext//Wrist Flex C6-7 N N Triceps  C7    C8 4th & 5th Finger N N Flex/ Ext Carpi Ulnaris C8 N N Patellar (L3-4)    T1 Medial Arm N N Interossei T1 N N Gastrocnemius    L2 Medial thigh/groin N N Illiopsoas (L2-3) N N     L3 Lower thigh/med.knee N N Quadriceps (L3-4) N N     L4 Medial leg/lat thigh N N Tibialis Ant (L4-5) N N     L5 Lat. leg & dorsal foot N N EHL (L5) N N     S1 post/lat foot/thigh/leg N N Gastrocnemius (S1-2) N N     S2 Post./med. thigh & leg N N Hamstrings (L4-S3) N N      CRANIAL NERVES II, III, IV, VI: Pupils equal and reactive to light, visual acuity and visual fields are intact, extraocular muscles are intact  V: Facial sensation is decreased on R side of forehead, otherwise symmetric bilaterally  VII: Facial strength is intact and symmetric bilaterally  VIII: Hearing is normal as tested by gross conversation IX, X: Palate elevates midline, normal phonation, uvula midline XI: Shoulder shrug strength is intact  XII: Tongue protrudes midline   COORDINATION Finger to Nose: Normal Heel to Shin: Normal Pronator Drift: Negative Rapid Alternating Movements: Normal Finger to Thumb Opposition: Normal   RANGE OF MOTION Cervical Spine AROM is limited in all  directions but especially extension. No functional focal deficits in AROM noted in BUE/BLE  MANUAL MUSCLE TESTING BUE/BLE strength WNL without focal deficits  TRANSFERS/GAIT Independent for transfers and ambulation without assistive device   PATIENT SURVEYS DHI: 64/100  OCULOMOTOR / VESTIBULAR TESTING  Oculomotor Exam- Room Light  Findings Comments  Ocular Alignment normal   Ocular ROM normal   Spontaneous Nystagmus normal   Gaze-Holding Nystagmus normal   End-Gaze Nystagmus normal   Vergence (normal 2-3) not examined   Smooth Pursuit normal   Cross-Cover Test not examined   Saccades abnormal Multiple corrective saccades to reach target  VOR Cancellation normal   Left Head Impulse normal   Right Head Impulse normal   Static Acuity  not examined   Dynamic Acuity not examined    Oculomotor Exam- Fixation Suppressed: Deferred  BPPV TESTS:  Symptoms Duration Intensity Nystagmus  L Dix-Hallpike ?dizziness   Possibly a few L torsional beats but not overly convincing  R Dix-Hallpike None   None  L Head Roll Dizziness 15s Mild 5-8 L torsional beats of nystagmus  R Head Roll None   None  L Sidelying Test Dizziness 5s Mild 3-5 downbeats noted  R Sidelying Test Vertigo 8-10s Moderate Vigorous upbeating R torsional nystagmus  (blank = not tested)   FUNCTIONAL OUTCOME MEASURES  Results Comments  BERG    DGI    FGA    TUG    5TSTS    6 Minute Walk Test    10 Meter Gait Speed    (blank = not tested)   TODAY'S TREATMENT  Deferred   PATIENT EDUCATION:  Education details: Plan of care, BPPV Person educated: Patient Education method: Explanation, video, handout Education comprehension: verbalized understanding   HOME EXERCISE PROGRAM:  Access Code: [email or web address] URL: [email or web address] Date: 12/12/2023 Prepared by: Selinda Eck  Patient Education - BPPV  VEDA BPPV Handout   ASSESSMENT: CLINICAL IMPRESSION: Patient is a 53 y.o. female who was seen today for physical therapy evaluation and treatment of BPPV. 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 Due to time constraints unable to initiate CRT on this date. Plan to initiate treatment at first follow-up session.   OBJECTIVE IMPAIRMENTS: dizziness.   ACTIVITY LIMITATIONS: bending, transfers, and bed mobility  PARTICIPATION LIMITATIONS: cleaning, laundry, and community activity  PERSONAL FACTORS: Past/current experiences, Time since onset of injury/illness/exacerbation, and 3+ comorbidities: OSA, HTN, and headaches are also affecting patient's functional outcome.   REHAB POTENTIAL: Excellent  CLINICAL DECISION MAKING: Evolving/moderate complexity  EVALUATION COMPLEXITY: Moderate   GOALS:  SHORT TERM GOALS: Target date:  01/09/2024  Pt will be independent with HEP for dizziness in order to decrease symptoms, improve balance,decrease fall risk, and improve function at home. Baseline: Goal status: INITIAL   LONG TERM GOALS: Target date: 02/06/2024  Pt will decrease DHI score by at least 18 points in order to demonstrate clinically significant reduction in disability related to dizziness.  Baseline: 64/100; Goal status: INITIAL  2. Pt will report no further episode of vertigo with turning, rolling, bending, or looking upward in order to decrease her risk for falls and improve her function at home.  Baseline:  Goal status: INITIAL  3.  Pt will score >67% on the ABC in order to demonstrate clinically significant balance confidence above cut-off score;      Baseline: To be completed Goal status: INITIAL   PLAN: PT FREQUENCY: 1x/week  PT DURATION: 8 weeks  PLANNED INTERVENTIONS: Therapeutic exercises, Therapeutic activity, Neuromuscular re-education, Balance training, Gait  training, Patient/Family education, Self Care, Joint mobilization, Joint manipulation, Vestibular training, Canalith repositioning, Orthotic/Fit training, DME instructions, Dry Needling, Electrical stimulation, Spinal manipulation, Spinal mobilization, Cryotherapy, Moist heat, Taping, Traction, Ultrasound, Ionotophoresis 4mg /ml Dexamethasone , Manual therapy, and Re-evaluation.  PLAN FOR NEXT SESSION: Retest for BPPV and initiate canalith repositioning treatment (positive for R posterior canal BPPV during evaluation); Once clear perform fixation suppression vestibular/oculomotor testing as well as additional balance tests as needed;   Lanaysia Fritchman D Nuvia Hileman PT, DPT, GCS  Sarajean Dessert, PT 12/17/2023, 9:12 AM

## 2023-12-20 ENCOUNTER — Ambulatory Visit

## 2023-12-20 DIAGNOSIS — R42 Dizziness and giddiness: Secondary | ICD-10-CM | POA: Diagnosis not present

## 2023-12-20 NOTE — Therapy (Unsigned)
 OUTPATIENT PHYSICAL THERAPY VESTIBULAR TREATMENT  Patient Name: Ashlee Everett MRN: [id number] DOB:[birth year], 53 y.o., female Today's Date: 12/21/2023  END OF SESSION:  PT End of Session - 12/20/23 1348     Visit Number 2    Number of Visits 9    Date for Recertification  02/06/24    Authorization Type eval: 12/12/23    PT Start Time 1400    PT Stop Time 1445    PT Time Calculation (min) 45 min    Activity Tolerance Patient tolerated treatment well    Behavior During Therapy [facility] for tasks assessed/performed         Past Medical History:  Diagnosis Date   Anginal pain (HCC) 2017   Cancer (HCC) 2016   BASAL CELL   Diverticulitis    GERD (gastroesophageal reflux disease)    Headache    History of hiatal hernia    SMALL   Hypertension    IBS (irritable bowel syndrome)    Paroxysmal SVT (supraventricular tachycardia) (HCC)    Sleep apnea    USES CPAP   Past Surgical History:  Procedure Laterality Date   ABDOMINAL HYSTERECTOMY     APPENDECTOMY     CHOLECYSTECTOMY N/A 06/21/2017   Procedure: LAPAROSCOPIC CHOLECYSTECTOMY;  Surgeon: Rodolph Romano, MD;  Location: [facility] ORS;  Service: General;  Laterality: N/A;   ESOPHAGOGASTRODUODENOSCOPY (EGD) WITH PROPOFOL  N/A 06/11/2017   Procedure: ESOPHAGOGASTRODUODENOSCOPY (EGD) WITH PROPOFOL ;  Surgeon: Gaylyn Gladis PENNER, MD;  Location: [facility] ENDOSCOPY;  Service: Endoscopy;  Laterality: N/A;   LAPAROSCOPIC LYSIS OF ADHESIONS     SALPINGOOPHORECTOMY Left    SIGMOID RESECTION / RECTOPEXY     TUBAL LIGATION     Patient Active Problem List   Diagnosis Date Noted   Chest pain 02/16/2015   Elevated troponin 02/16/2015   Hypertension 02/16/2015   Paroxysmal SVT (supraventricular tachycardia) (HCC) 02/16/2015   PCP: Diedra Lame, MD  REFERRING PROVIDER: Diedra Lame, MD   REFERRING DIAG: H81.10 (ICD-10-CM) - Benign paroxysmal vertigo, unspecified ear   RATIONALE FOR EVALUATION AND TREATMENT: Rehabilitation  THERAPY  DIAG: Dizziness and giddiness  ONSET DATE: 11/30/23  FOLLOW-UP APPT SCHEDULED WITH REFERRING PROVIDER: No, nothing scheduled;  FROM INITIAL EVALUATION SUBJECTIVE:   Chief Complaint:  Dizziness  Pertinent History Pt arrives with complaints of dizziness which started two Fridays ago when she stood up from the couch. She laid back down on her L side and started having vertigo. Her dizziness persisted for a few hours and It has not let up a whole lot since then. Pt reports intermittent episodes of vertigo/dizziness/lightheadedness 2-3 times per day. Occasionally she feels like she is going to pass out/black out. Symptoms last for approximately 1 minute. She has a remote history of vertigo (approximately 5 years ago). At that time she went to the ER and was given meds and her symptoms resolved. She is taking meclizine  3 times per day and is now on a Prednisone taper as well (took Meclizine  this morning). She denies any improvement on the steroid taper. She continues to complain of aural fullness and occasional tinnitus bilaterally. MRI in the ER showed no acute intracranial changes but moderate to large bilateral mastoid effusions. PMH includes hypertension, IBS, recent L knee meniscus repair. No recent medication changes prior to symptom onset. No chest pain, ear pain, or shortness of breath.   11/30/23: Brain MRI: IMPRESSION: 1. No acute intracranial abnormality. 2. Few scattered subcentimeter foci of T2/FLAIR hyperintensities involving the supratentorial cerebral white matter, nonspecific, but most  commonly related to chronic microvascular ischemic disease. Overall, changes are extremely mild for age. 3. Moderate to large bilateral mastoid effusions, of uncertain significance. Correlation with physical exam recommended.  Description of dizziness: vertigo, dizziness, lightheadedness Frequency: 2-3x/day Duration: initially hours, now only 1 minutel Symptom nature: intermittent Progression of  symptoms since onset: no change History of similar episodes: Yes, vertigo approximately 5 years ago  Provocative Factors:  laying down, supine to sit, standing up quickly; Easing Factors: waiting for symptoms to pass  Auditory complaints (tinnitus, pain, drainage, hearing loss, aural fullness): Yes aural fullness, tinnitus, hearing difficulty (worse on the R side), R ear pain, denies drainage; Vision changes (diplopia, visual field loss, recent changes, recent eye exam): No Chest pain/palpitations: No History of head injury/concussion: No Stress/anxiety: Yes, recently lost her job because of her L knee pain, recent passing of her mother; Migraines/headaches: Yes, none recently. Very remote history of migraines; Nausea/vomiting: Yes, nausea but no vomiting Numbness/tingling: No Focal weakness: No Dysarthria/dysphagia/drop attacks: No  Has patient fallen in last 6 months? No Pertinent pain: Yes, neck pain and bilateral shoulder stiffness Dominant hand: right Imaging: Yes, see history; Prior level of function: Independent Occupational demands: Not currently working Hobbies: reading  [employer]: Positive for basal cell carcinoma, Negative for chills/fever, night sweats, unexplained weight loss/gain  PRECAUTIONS: None  WEIGHT BEARING RESTRICTIONS No  LIVING ENVIRONMENT: Lives with: lives with their family Lives in: Mobile home, 8 steps to enter, bilateral narrow rails;  PATIENT GOALS Decrease symptoms   OBJECTIVE EXAMINATION  POSTURE: No gross deficits contributing to symptoms  NEUROLOGICAL SCREEN: (2+ unless otherwise noted.) N=normal  Ab=abnormal  Level Dermatome R L Myotome R L Reflex R L  C3 Anterior Neck N N Sidebend C2-3 N N Jaw CN V    C4 Top of Shoulder N N Shoulder Shrug C4 N N Hoffman's UMN    C5 Lateral Upper Arm N N Shoulder ABD C4-5 N N Biceps C5-6    C6 Lateral Arm/ Thumb N N Arm Flex/ Wrist Ext C5-6 N N Brachiorad. C5-6    C7 Middle Finger N N Arm Ext//Wrist  Flex C6-7 N N Triceps C7    C8 4th & 5th Finger N N Flex/ Ext Carpi Ulnaris C8 N N Patellar (L3-4)    T1 Medial Arm N N Interossei T1 N N Gastrocnemius    L2 Medial thigh/groin N N Illiopsoas (L2-3) N N     L3 Lower thigh/med.knee N N Quadriceps (L3-4) N N     L4 Medial leg/lat thigh N N Tibialis Ant (L4-5) N N     L5 Lat. leg & dorsal foot N N EHL (L5) N N     S1 post/lat foot/thigh/leg N N Gastrocnemius (S1-2) N N     S2 Post./med. thigh & leg N N Hamstrings (L4-S3) N N      CRANIAL NERVES II, III, IV, VI: Pupils equal and reactive to light, visual acuity and visual fields are intact, extraocular muscles are intact  V: Facial sensation is decreased on R side of forehead, otherwise symmetric bilaterally  VII: Facial strength is intact and symmetric bilaterally  VIII: Hearing is normal as tested by gross conversation IX, X: Palate elevates midline, normal phonation, uvula midline XI: Shoulder shrug strength is intact  XII: Tongue protrudes midline   COORDINATION Finger to Nose: Normal Heel to Shin: Normal Pronator Drift: Negative Rapid Alternating Movements: Normal Finger to Thumb Opposition: Normal   RANGE OF MOTION Cervical Spine AROM is limited  in all directions but especially extension. No functional focal deficits in AROM noted in BUE/BLE  MANUAL MUSCLE TESTING BUE/BLE strength WNL without focal deficits  TRANSFERS/GAIT Independent for transfers and ambulation without assistive device   PATIENT SURVEYS DHI: 64/100  OCULOMOTOR / VESTIBULAR TESTING  Oculomotor Exam- Room Light  Findings Comments  Ocular Alignment normal   Ocular ROM normal   Spontaneous Nystagmus normal   Gaze-Holding Nystagmus normal   End-Gaze Nystagmus normal   Vergence (normal 2-3) not examined   Smooth Pursuit normal   Cross-Cover Test not examined   Saccades abnormal Multiple corrective saccades to reach target  VOR Cancellation normal   Left Head Impulse normal   Right Head Impulse  normal   Static Acuity not examined   Dynamic Acuity not examined    Oculomotor Exam- Fixation Suppressed: Deferred  BPPV TESTS:  Symptoms Duration Intensity Nystagmus  L Dix-Hallpike ?dizziness   Possibly a few L torsional beats but not overly convincing  R Dix-Hallpike None   None  L Head Roll Dizziness 15s Mild 5-8 L torsional beats of nystagmus  R Head Roll None   None  L Sidelying Test Dizziness 5s Mild 3-5 downbeats noted  R Sidelying Test Vertigo 8-10s Moderate Vigorous upbeating R torsional nystagmus  (blank = not tested)  FUNCTIONAL OUTCOME MEASURES  Results Comments  BERG    DGI    FGA    TUG    5TSTS    6 Minute Walk Test    10 Meter Gait Speed    (blank = not tested)   TODAY'S TREATMENT   SUBJECTIVE: Pt reports that she is doing alright today. No changes since the initial evaluation and she continues with episodes of vertigo. Denies pain. No specific questions or concerns.   PAIN:   Neuromuscular Re-education  Interval history obtained, discussed plan of care, education about BPPV and today's findings;   Canalith Repositioning Treatment Positive R Dix-Hallpike Test for upbeating R torsional nystagmus lasting 3-5s with concurrent vertigo of moderate severity. Pt treated with 3 bouts of Epley Maneuver for presumed L posterior canal BPPV. One minute holds in each position and retesting between maneuvers. After first maneuver R Dix-Hallpike Test is negative for both vertigo and nystagmus however second maneuver completed. After second maneuver performed R Dix-Hallpike and it is positive faint upbeating R torsional nystagmus with mild vertigo. Completed third maneuver. Afterward performed Roll Test bilaterally and in R Roll Test position pt presents with upbeating R torsional nystagmus with concurrent vertigo. Performed one bout of the R Liberatory Maneuver with 1 minute holds in each position.   PATIENT EDUCATION:  Education details: Plan of care, BPPV Person  educated: Patient Education method: Explanation, video, handout Education comprehension: verbalized understanding   HOME EXERCISE PROGRAM:  Access Code: [email or web address] URL: [email or web address] Date: 12/12/2023 Prepared by: Selinda Eck  Patient Education - BPPV  Ashlee Everett BPPV Handout   ASSESSMENT: CLINICAL IMPRESSION: Positive R Dix-Hallpike Test for upbeating R torsional nystagmus lasting 3-5s with concurrent vertigo of moderate severity. Pt treated with 3 bouts of Epley Maneuver for presumed L posterior canal BPPV. One minute holds in each position and retesting between maneuvers. After first maneuver R Dix-Hallpike Test is negative for both vertigo and nystagmus however second maneuver completed. After second maneuver performed R Dix-Hallpike and it is positive faint upbeating R torsional nystagmus with mild vertigo. Completed third maneuver. Afterward performed Roll Test bilaterally and in R Roll Test position pt presents with upbeating R torsional nystagmus with concurrent vertigo. Performed  one bout of the R Liberatory Maneuver with 1 minute holds in each position. At next session will repeat BPPV testing and treat as appropriate. Once clear will complete additional vestibular testing as well as general balance screening as appropriate.  OBJECTIVE IMPAIRMENTS: dizziness.   ACTIVITY LIMITATIONS: bending, transfers, and bed mobility  PARTICIPATION LIMITATIONS: cleaning, laundry, and community activity  PERSONAL FACTORS: Past/current experiences, Time since onset of injury/illness/exacerbation, and 3+ comorbidities: OSA, HTN, and headaches are also affecting patient's functional outcome.   REHAB POTENTIAL: Excellent  CLINICAL DECISION MAKING: Evolving/moderate complexity  EVALUATION COMPLEXITY: Moderate   GOALS:  SHORT TERM GOALS: Target date: 01/09/2024  Pt will be independent with HEP for dizziness in order to decrease symptoms, improve balance,decrease fall risk,  and improve function at home. Baseline: Goal status: INITIAL   LONG TERM GOALS: Target date: 02/06/2024  Pt will decrease DHI score by at least 18 points in order to demonstrate clinically significant reduction in disability related to dizziness.  Baseline: 64/100; Goal status: INITIAL  2. Pt will report no further episode of vertigo with turning, rolling, bending, or looking upward in order to decrease her risk for falls and improve her function at home.  Baseline:  Goal status: INITIAL  3.  Pt will score >67% on the ABC in order to demonstrate clinically significant balance confidence above cut-off score;      Baseline: To be completed Goal status: INITIAL   PLAN: PT FREQUENCY: 1x/week  PT DURATION: 8 weeks  PLANNED INTERVENTIONS: Therapeutic exercises, Therapeutic activity, Neuromuscular re-education, Balance training, Gait training, Patient/Family education, Self Care, Joint mobilization, Joint manipulation, Vestibular training, Canalith repositioning, Orthotic/Fit training, DME instructions, Dry Needling, Electrical stimulation, Spinal manipulation, Spinal mobilization, Cryotherapy, Moist heat, Taping, Traction, Ultrasound, Ionotophoresis 4mg /ml Dexamethasone , Manual therapy, and Re-evaluation.  PLAN FOR NEXT SESSION: Retest for BPPV and perform canalith repositioning treatment. Once clear will perform fixation suppression vestibular/oculomotor testing as well as additional balance tests as needed;   Shaunee Mulkern D Niomi Valent PT, DPT, GCS  Tamerra Merkley, PT 12/21/2023, 10:13 AM

## 2023-12-24 ENCOUNTER — Encounter
Admission: RE | Disposition: A | Discharge status: Home / Self Care | Payer: Self-pay | Source: Home / Self Care | Attending: Gastroenterology

## 2023-12-24 ENCOUNTER — Ambulatory Visit
Admission: RE | Admit: 2023-12-24 | Discharge: 2023-12-24 | Disposition: A | Discharge status: Home / Self Care | Attending: Gastroenterology | Admitting: Gastroenterology

## 2023-12-24 ENCOUNTER — Ambulatory Visit: Admitting: Certified Registered"

## 2023-12-24 DIAGNOSIS — G4730 Sleep apnea, unspecified: Secondary | ICD-10-CM | POA: Diagnosis not present

## 2023-12-24 DIAGNOSIS — E66813 Obesity, class 3: Secondary | ICD-10-CM | POA: Diagnosis not present

## 2023-12-24 DIAGNOSIS — Z1211 Encounter for screening for malignant neoplasm of colon: Secondary | ICD-10-CM | POA: Insufficient documentation

## 2023-12-24 DIAGNOSIS — K219 Gastro-esophageal reflux disease without esophagitis: Secondary | ICD-10-CM | POA: Diagnosis not present

## 2023-12-24 DIAGNOSIS — K589 Irritable bowel syndrome without diarrhea: Secondary | ICD-10-CM | POA: Diagnosis not present

## 2023-12-24 DIAGNOSIS — K5730 Diverticulosis of large intestine without perforation or abscess without bleeding: Secondary | ICD-10-CM | POA: Diagnosis not present

## 2023-12-24 DIAGNOSIS — Z6841 Body Mass Index (BMI) 40.0 and over, adult: Secondary | ICD-10-CM | POA: Diagnosis not present

## 2023-12-24 DIAGNOSIS — Z980 Intestinal bypass and anastomosis status: Secondary | ICD-10-CM | POA: Diagnosis not present

## 2023-12-24 DIAGNOSIS — I10 Essential (primary) hypertension: Secondary | ICD-10-CM | POA: Diagnosis not present

## 2023-12-24 DIAGNOSIS — K640 First degree hemorrhoids: Secondary | ICD-10-CM | POA: Insufficient documentation

## 2023-12-24 HISTORY — PX: COLONOSCOPY: SHX5424

## 2023-12-24 SURGERY — COLONOSCOPY
Anesthesia: General

## 2023-12-24 MED ORDER — PROPOFOL 10 MG/ML IV BOLUS
INTRAVENOUS | Status: DC | PRN
  Administered 2023-12-24: 100 mg via INTRAVENOUS

## 2023-12-24 MED ORDER — DEXMEDETOMIDINE HCL IN NACL 80 MCG/20ML IV SOLN
INTRAVENOUS | Status: DC | PRN
  Administered 2023-12-24: 10 ug via INTRAVENOUS

## 2023-12-24 MED ORDER — PROPOFOL 500 MG/50ML IV EMUL
INTRAVENOUS | Status: DC | PRN
  Administered 2023-12-24: 120 ug/kg/min via INTRAVENOUS

## 2023-12-24 MED ORDER — SODIUM CHLORIDE 0.9 % IV SOLN
INTRAVENOUS | Status: DC
  Administered 2023-12-24: 500 mL via INTRAVENOUS

## 2023-12-24 MED ORDER — LIDOCAINE 2% (20 MG/ML) 5 ML SYRINGE
INTRAMUSCULAR | Status: DC | PRN
  Administered 2023-12-24: 20 mg via INTRAVENOUS

## 2023-12-24 NOTE — Anesthesia Postprocedure Evaluation (Signed)
 Anesthesia Post Note  Patient: Ashlee Everett  Procedure(s) Performed: COLONOSCOPY  Patient location during evaluation: PACU Anesthesia Type: General Level of consciousness: awake and alert Pain management: pain level controlled Vital Signs Assessment: post-procedure vital signs reviewed and stable Respiratory status: spontaneous breathing, nonlabored ventilation, respiratory function stable and patient connected to nasal cannula oxygen Cardiovascular status: blood pressure returned to baseline and stable Postop Assessment: no apparent nausea or vomiting Anesthetic complications: no   No notable events documented.   Last Vitals:  Vitals:   12/24/23 1233 12/24/23 1238  BP: 119/64 (!) 140/80  Pulse: 85 77  Resp: 20 (!) 22  Temp:    SpO2: 95% 95%    Last Pain:  Vitals:   12/24/23 1238  TempSrc:   PainSc: 0-No pain                 Lynwood KANDICE Clause

## 2023-12-24 NOTE — Op Note (Signed)
 [facility] Gastroenterology Patient Name: Ashlee Everett Procedure Date: 12/24/2023 11:17 AM MRN: [id number] Account #: [id number] Date of Birth: 1970/06/26 Admit Type: Outpatient Age: 53 Room: [facility] ENDO ROOM 3 Gender: Female Note Status: Finalized Instrument Name: Colon Scope [id number] Procedure:             Colonoscopy Indications:           Screening for colorectal malignant neoplasm Providers:             Ole Schick MD, MD Medicines:             Monitored Anesthesia Care Complications:         No immediate complications. Procedure:             Pre-Anesthesia Assessment:                        - Prior to the procedure, a History and Physical was                         performed, and patient medications and allergies were                         reviewed. The patient is competent. The risks and                         benefits of the procedure and the sedation options and                         risks were discussed with the patient. All questions                         were answered and informed consent was obtained.                         Patient identification and proposed procedure were                         verified by the physician, the nurse, the                         anesthesiologist, the anesthetist and the technician                         in the endoscopy suite. Mental Status Examination:                         alert and oriented. Airway Examination: normal                         oropharyngeal airway and neck mobility. Respiratory                         Examination: clear to auscultation. CV Examination:                         normal. Prophylactic Antibiotics: The patient does not                         require prophylactic antibiotics. Prior  Anticoagulants: The patient has taken no anticoagulant                         or antiplatelet agents. ASA Grade Assessment: III - A                         patient with  severe systemic disease. After reviewing                         the risks and benefits, the patient was deemed in                         satisfactory condition to undergo the procedure. The                         anesthesia plan was to use monitored anesthesia care                         (MAC). Immediately prior to administration of                         medications, the patient was re-assessed for adequacy                         to receive sedatives. The heart rate, respiratory                         rate, oxygen saturations, blood pressure, adequacy of                         pulmonary ventilation, and response to care were                         monitored throughout the procedure. The physical                         status of the patient was re-assessed after the                         procedure.                        After obtaining informed consent, the colonoscope was                         passed under direct vision. Throughout the procedure,                         the patient's blood pressure, pulse, and oxygen                         saturations were monitored continuously. The was                         introduced through the anus and advanced to the the                         terminal ileum, with identification of the appendiceal  orifice and IC valve. The colonoscopy was performed                         without difficulty. The patient tolerated the                         procedure well. The quality of the bowel preparation                         was good. The terminal ileum, ileocecal valve,                         appendiceal orifice, and rectum were photographed. Findings:      The perianal and digital rectal examinations were normal.      The terminal ileum appeared normal.      There was evidence of a prior end-to-end colo-colonic anastomosis in the       sigmoid colon. This was patent and was characterized by healthy        appearing mucosa.      A few small-mouthed diverticula were found in the sigmoid colon.      Internal hemorrhoids were found during retroflexion. The hemorrhoids       were Grade I (internal hemorrhoids that do not prolapse).      The exam was otherwise without abnormality on direct and retroflexion       views. Impression:            - The examined portion of the ileum was normal.                        - Patent end-to-end colo-colonic anastomosis,                         characterized by healthy appearing mucosa.                        - Diverticulosis in the sigmoid colon.                        - Internal hemorrhoids.                        - The examination was otherwise normal on direct and                         retroflexion views.                        - No specimens collected. Recommendation:        - Discharge patient to home.                        - Resume previous diet.                        - Continue present medications.                        - Repeat colonoscopy in 10 years for screening  purposes.                        - Return to referring physician as previously                         scheduled. Procedure Code(s):     --- Professional ---                        H9878, Colorectal cancer screening; colonoscopy on                         individual not meeting criteria for high risk Diagnosis Code(s):     --- Professional ---                        Z12.11, Encounter for screening for malignant neoplasm                         of colon                        K64.0, First degree hemorrhoids                        Z98.0, Intestinal bypass and anastomosis status                        K57.30, Diverticulosis of large intestine without                         perforation or abscess without bleeding CPT copyright 2022 American Medical Association. All rights reserved. The codes documented in this report are preliminary and upon coder review may   be revised to meet current compliance requirements. Ole Schick MD, MD 12/24/2023 12:29:14 PM Number of Addenda: 0 Note Initiated On: 12/24/2023 11:17 AM Scope Withdrawal Time: 0 hours 7 minutes 34 seconds  Total Procedure Duration: 0 hours 9 minutes 55 seconds  Estimated Blood Loss:  Estimated blood loss: none.      [facility]

## 2023-12-24 NOTE — Transfer of Care (Signed)
 Immediate Anesthesia Transfer of Care Note  Patient: Ashlee Everett  Procedure(s) Performed: COLONOSCOPY  Patient Location: Endoscopy Unit  Anesthesia Type:General  Level of Consciousness: awake  Airway & Oxygen Therapy: Patient Spontanous Breathing  Post-op Assessment: Report given to RN and Post -op Vital signs reviewed and stable  Post vital signs: Reviewed  Last Vitals:  Vitals Value Taken Time  BP 111/78   Temp    Pulse 77   Resp 14   SpO2 95     Last Pain:  Vitals:   12/24/23 1120  TempSrc: Temporal  PainSc: 0-No pain         Complications: No notable events documented.

## 2023-12-24 NOTE — Interval H&P Note (Signed)
 History and Physical Interval Note:  12/24/2023 11:53 AM  Sharlet JINNY Sink  has presented today for surgery, with the diagnosis of CCA SCREEN.  The various methods of treatment have been discussed with the patient and family. After consideration of risks, benefits and other options for treatment, the patient has consented to  Procedure(s): COLONOSCOPY (N/A) as a surgical intervention.  The patient's history has been reviewed, patient examined, no change in status, stable for surgery.  I have reviewed the patient's chart and labs.  Questions were answered to the patient's satisfaction.     Ole ONEIDA Schick  Ok to proceed with colonoscopy

## 2023-12-24 NOTE — Anesthesia Preprocedure Evaluation (Signed)
 Anesthesia Evaluation  Patient identified by MRN, date of birth, ID band Patient awake    Reviewed: Allergy & Precautions, H&P , NPO status , Patient's Chart, lab work & pertinent test results, reviewed documented beta blocker date and time   Airway Mallampati: II   Neck ROM: full    Dental  (+) Poor Dentition   Pulmonary sleep apnea    Pulmonary exam normal        Cardiovascular Exercise Tolerance: Poor hypertension, On Medications + angina with exertion Normal cardiovascular exam Rhythm:regular Rate:Normal     Neuro/Psych  Headaches  negative psych ROS   GI/Hepatic Neg liver ROS, hiatal hernia,GERD  Medicated,,  Endo/Other    Class 3 obesity  Renal/GU negative Renal ROS  negative genitourinary   Musculoskeletal   Abdominal   Peds  Hematology negative hematology ROS (+)   Anesthesia Other Findings Past Medical History: 2017: Anginal pain (HCC) 2016: Cancer (HCC)     Comment:  BASAL CELL No date: Diverticulitis No date: GERD (gastroesophageal reflux disease) No date: Headache No date: History of hiatal hernia     Comment:  SMALL No date: Hypertension No date: IBS (irritable bowel syndrome) No date: Paroxysmal SVT (supraventricular tachycardia) (HCC) No date: Sleep apnea     Comment:  USES CPAP Past Surgical History: No date: ABDOMINAL HYSTERECTOMY No date: APPENDECTOMY 06/21/2017: CHOLECYSTECTOMY; N/A     Comment:  Procedure: LAPAROSCOPIC CHOLECYSTECTOMY;  Surgeon:               Rodolph Romano, MD;  Location: [facility] ORS;  Service:              General;  Laterality: N/A; 06/11/2017: ESOPHAGOGASTRODUODENOSCOPY (EGD) WITH PROPOFOL ; N/A     Comment:  Procedure: ESOPHAGOGASTRODUODENOSCOPY (EGD) WITH               PROPOFOL ;  Surgeon: Gaylyn Gladis PENNER, MD;  Location:               [facility];  Service: Endoscopy;  Laterality: N/A; No date: LAPAROSCOPIC LYSIS OF ADHESIONS No date:  SALPINGOOPHORECTOMY; Left No date: SIGMOID RESECTION / RECTOPEXY No date: TUBAL LIGATION BMI    Body Mass Index: 53.17 kg/m     Reproductive/Obstetrics negative OB ROS                              Anesthesia Physical Anesthesia Plan  ASA: 3  Anesthesia Plan: General   Post-op Pain Management:    Induction:   PONV Risk Score and Plan:   Airway Management Planned:   Additional Equipment:   Intra-op Plan:   Post-operative Plan:   Informed Consent: I have reviewed the patients History and Physical, chart, labs and discussed the procedure including the risks, benefits and alternatives for the proposed anesthesia with the patient or authorized representative who has indicated his/her understanding and acceptance.     Dental Advisory Given  Plan Discussed with: CRNA  Anesthesia Plan Comments:         Anesthesia Quick Evaluation

## 2023-12-24 NOTE — H&P (Signed)
 Outpatient short stay form Pre-procedure 12/24/2023  Ashlee ONEIDA Schick, MD  Primary Physician: Diedra Lame, MD  Reason for visit:  Screening  History of present illness:    53 y/o lady with history of hypertension, obesity, and OSA here for colonoscopy for screening. Last colonoscopy in 2013 with diverticulosis. History of sigmoidectomy for diverticulitis. Also with history of hysterectomy and cholecystectomy. No blood thinners. No family history of GI malignancies.    Current Facility-Administered Medications:    0.9 %  sodium chloride  infusion, , Intravenous, Continuous, Amro Winebarger, Ashlee ONEIDA, MD, Last Rate: 20 mL/hr at 12/24/23 1124, 500 mL at 12/24/23 1124  Medications Prior to Admission  Medication Sig Dispense Refill Last Dose/Taking   acetaminophen  (TYLENOL ) 500 MG tablet Take 500 mg by mouth every 6 (six) hours as needed (for pain.).   Past Week   losartan-hydrochlorothiazide (HYZAAR) 50-12.5 MG tablet Take 1 tablet by mouth daily.   12/24/2023   meclizine  (ANTIVERT ) 25 MG tablet Take 1 tablet (25 mg total) by mouth 3 (three) times daily as needed. 15 tablet 0 12/23/2023   metoprolol tartrate (LOPRESSOR) 25 MG tablet Take 25 mg by mouth 2 (two) times daily.   12/23/2023   pantoprazole (PROTONIX) 40 MG tablet Take 40 mg by mouth daily before breakfast.   12/23/2023   albuterol  (VENTOLIN  HFA) 108 (90 Base) MCG/ACT inhaler Inhale 2 puffs into the lungs every 6 (six) hours as needed for wheezing or shortness of breath. 8 g 2    HYDROcodone -acetaminophen  (NORCO/VICODIN) 5-325 MG tablet Take 1 tablet by mouth every 4 (four) hours as needed. (Patient not taking: Reported on 12/24/2023) 15 tablet 0 Completed Course   ibuprofen (ADVIL,MOTRIN) 200 MG tablet Take 400-600 mg by mouth every 8 (eight) hours as needed for mild pain (for pain.).       metoCLOPramide  (REGLAN ) 10 MG tablet Take 1 tablet (10 mg total) by mouth every 6 (six) hours as needed for nausea. 10 tablet 0    pseudoephedrine   (SUDAFED) 60 MG tablet Take 1 tablet (60 mg total) by mouth every 6 (six) hours as needed for congestion. (Patient not taking: Reported on 12/24/2023) 30 tablet 0 Not Taking     Allergies  Allergen Reactions   Fioricet [Butalbital-Apap-Caffeine] Shortness Of Breath and Nausea And Vomiting   Other     PT STATES THEIR IS ANOTHER HEADACHE MED THAT SHE IS ALLERGIC TO (SOB) BUT SHE IS UNSURE OF NAME...SHE THINKS MAYBE IMITREX BUT IS UNSURE     Past Medical History:  Diagnosis Date   Anginal pain (HCC) 2017   Cancer (HCC) 2016   BASAL CELL   Diverticulitis    GERD (gastroesophageal reflux disease)    Headache    History of hiatal hernia    SMALL   Hypertension    IBS (irritable bowel syndrome)    Paroxysmal SVT (supraventricular tachycardia) (HCC)    Sleep apnea    USES CPAP    Review of systems:  Otherwise negative.    Physical Exam  Gen: Alert, oriented. Appears stated age.  HEENT: PERRLA. Lungs: No respiratory distress CV: RRR Abd: soft, benign, no masses Ext: No edema    Planned procedures: Proceed with colonoscopy. The patient understands the nature of the planned procedure, indications, risks, alternatives and potential complications including but not limited to bleeding, infection, perforation, damage to internal organs and possible oversedation/side effects from anesthesia. The patient agrees and gives consent to proceed.  Please refer to procedure notes for findings, recommendations and patient disposition/instructions.  Ashlee ONEIDA Schick, MD [facility]

## 2023-12-27 ENCOUNTER — Ambulatory Visit

## 2023-12-27 DIAGNOSIS — R42 Dizziness and giddiness: Secondary | ICD-10-CM | POA: Diagnosis not present

## 2023-12-27 NOTE — Therapy (Signed)
 OUTPATIENT PHYSICAL THERAPY VESTIBULAR TREATMENT  Patient Name: Ashlee Everett MRN: [id number] DOB:July 14, 1970, 53 y.o., female Today's Date: 12/27/2023  END OF SESSION:  PT End of Session - 12/27/23 1110     Visit Number 3    Number of Visits 9    Date for Recertification  02/06/24    Authorization Type eval: 12/12/23    PT Start Time 1109    PT Stop Time 1136    PT Time Calculation (min) 27 min    Activity Tolerance Patient tolerated treatment well    Behavior During Therapy [facility] for tasks assessed/performed         Past Medical History:  Diagnosis Date   Anginal pain 2017   Cancer (HCC) 2016   BASAL CELL   Diverticulitis    GERD (gastroesophageal reflux disease)    Headache    History of hiatal hernia    SMALL   Hypertension    IBS (irritable bowel syndrome)    Paroxysmal SVT (supraventricular tachycardia)    Sleep apnea    USES CPAP   Past Surgical History:  Procedure Laterality Date   ABDOMINAL HYSTERECTOMY     APPENDECTOMY     CHOLECYSTECTOMY N/A 06/21/2017   Procedure: LAPAROSCOPIC CHOLECYSTECTOMY;  Surgeon: Rodolph Romano, MD;  Location: [facility] OR[facility];  Service: General;  Laterality: N/A;   COLONOSCOPY N/A 12/24/2023   Procedure: COLONOSCOPY;  Surgeon: Maryruth Ole DASEN, MD;  Location: [facility] END[facility];  Service: Endoscopy;  Laterality: N/A;   ESOPHAGOGASTRODUODENOSCOPY (EGD) WITH PROPOFOL  N/A 06/11/2017   Procedure: ESOPHAGOGASTRODUODENOSCOPY (EGD) WITH PROPOFOL ;  Surgeon: Gaylyn Gladis PENNER, MD;  Location: [facility] ENDOSCOPY;  Service: Endoscopy;  Laterality: N/A;   LAPAROSCOPIC LYSIS OF ADHESIONS     SALPINGOOPHORECTOMY Left    SIGMOID RESECTION / RECTOPEXY     TUBAL LIGATION     Patient Active Problem List   Diagnosis Date Noted   Chest pain 02/16/2015   Elevated troponin 02/16/2015   Hypertension 02/16/2015   Paroxysmal SVT (supraventricular tachycardia) 02/16/2015   PCP: Diedra Lame, MD  REFERRING PROVIDER: Diedra Lame, MD   REFERRING  DIAG: H81.10 (ICD-10-CM) - Benign paroxysmal vertigo, unspecified ear   RATIONALE FOR EVALUATION AND TREATMENT: Rehabilitation  THERAPY DIAG: Dizziness and giddiness  ONSET DATE: 11/30/23  FOLLOW-UP APPT SCHEDULED WITH REFERRING PROVIDER: No, nothing scheduled;  FROM INITIAL EVALUATION SUBJECTIVE:   Chief Complaint:  Dizziness  Pertinent History Pt arrives with complaints of dizziness which started two Fridays ago when she stood up from the couch. She laid back down on her L side and started having vertigo. Her dizziness persisted for a few hours and It has not let up a whole lot since then. Pt reports intermittent episodes of vertigo/dizziness/lightheadedness 2-3 times per day. Occasionally she feels like she is going to pass out/black out. Symptoms last for approximately 1 minute. She has a remote history of vertigo (approximately 5 years ago). At that time she went to the ER and was given meds and her symptoms resolved. She is taking meclizine  3 times per day and is now on a Prednisone taper as well (took Meclizine  this morning). She denies any improvement on the steroid taper. She continues to complain of aural fullness and occasional tinnitus bilaterally. MRI in the ER showed no acute intracranial changes but moderate to large bilateral mastoid effusions. PMH includes hypertension, IBS, recent L knee meniscus repair. No recent medication changes prior to symptom onset. No chest pain, ear pain, or shortness of breath.   11/30/23: Brain MRI: IMPRESSION:  1. No acute intracranial abnormality. 2. Few scattered subcentimeter foci of T2/FLAIR hyperintensities involving the supratentorial cerebral white matter, nonspecific, but most commonly related to chronic microvascular ischemic disease. Overall, changes are extremely mild for age. 3. Moderate to large bilateral mastoid effusions, of uncertain significance. Correlation with physical exam recommended.  Description of dizziness: vertigo,  dizziness, lightheadedness Frequency: 2-3x/day Duration: initially hours, now only 1 minutel Symptom nature: intermittent Progression of symptoms since onset: no change History of similar episodes: Yes, vertigo approximately 5 years ago  Provocative Factors:  laying down, supine to sit, standing up quickly; Easing Factors: waiting for symptoms to pass  Auditory complaints (tinnitus, pain, drainage, hearing loss, aural fullness): Yes aural fullness, tinnitus, hearing difficulty (worse on the R side), R ear pain, denies drainage; Vision changes (diplopia, visual field loss, recent changes, recent eye exam): No Chest pain/palpitations: No History of head injury/concussion: No Stress/anxiety: Yes, recently lost her job because of her L knee pain, recent passing of her mother; Migraines/headaches: Yes, none recently. Very remote history of migraines; Nausea/vomiting: Yes, nausea but no vomiting Numbness/tingling: No Focal weakness: No Dysarthria/dysphagia/drop attacks: No  Has patient fallen in last 6 months? No Pertinent pain: Yes, neck pain and bilateral shoulder stiffness Dominant hand: right Imaging: Yes, see history; Prior level of function: Independent Occupational demands: Not currently working Hobbies: reading  [employer]: Positive for basal cell carcinoma, Negative for chills/fever, night sweats, unexplained weight loss/gain  PRECAUTIONS: None  WEIGHT BEARING RESTRICTIONS No  LIVING ENVIRONMENT: Lives with: lives with their family Lives in: Mobile home, 8 steps to enter, bilateral narrow rails;  PATIENT GOALS Decrease symptoms   OBJECTIVE EXAMINATION  POSTURE: No gross deficits contributing to symptoms  NEUROLOGICAL SCREEN: (2+ unless otherwise noted.) N=normal  Ab=abnormal  Level Dermatome R L Myotome R L Reflex R L  C3 Anterior Neck N N Sidebend C2-3 N N Jaw CN V    C4 Top of Shoulder N N Shoulder Shrug C4 N N Hoffman's UMN    C5 Lateral Upper Arm N N Shoulder  ABD C4-5 N N Biceps C5-6    C6 Lateral Arm/ Thumb N N Arm Flex/ Wrist Ext C5-6 N N Brachiorad. C5-6    C7 Middle Finger N N Arm Ext//Wrist Flex C6-7 N N Triceps C7    C8 4th & 5th Finger N N Flex/ Ext Carpi Ulnaris C8 N N Patellar (L3-4)    T1 Medial Arm N N Interossei T1 N N Gastrocnemius    L2 Medial thigh/groin N N Illiopsoas (L2-3) N N     L3 Lower thigh/med.knee N N Quadriceps (L3-4) N N     L4 Medial leg/lat thigh N N Tibialis Ant (L4-5) N N     L5 Lat. leg & dorsal foot N N EHL (L5) N N     S1 post/lat foot/thigh/leg N N Gastrocnemius (S1-2) N N     S2 Post./med. thigh & leg N N Hamstrings (L4-S3) N N      CRANIAL NERVES II, III, IV, VI: Pupils equal and reactive to light, visual acuity and visual fields are intact, extraocular muscles are intact  V: Facial sensation is decreased on R side of forehead, otherwise symmetric bilaterally  VII: Facial strength is intact and symmetric bilaterally  VIII: Hearing is normal as tested by gross conversation IX, X: Palate elevates midline, normal phonation, uvula midline XI: Shoulder shrug strength is intact  XII: Tongue protrudes midline   COORDINATION Finger to Nose: Normal Heel to Shin: Normal  Pronator Drift: Negative Rapid Alternating Movements: Normal Finger to Thumb Opposition: Normal   RANGE OF MOTION Cervical Spine AROM is limited in all directions but especially extension. No functional focal deficits in AROM noted in BUE/BLE  MANUAL MUSCLE TESTING BUE/BLE strength WNL without focal deficits  TRANSFERS/GAIT Independent for transfers and ambulation without assistive device   PATIENT SURVEYS DHI: 64/100  OCULOMOTOR / VESTIBULAR TESTING  Oculomotor Exam- Room Light  Findings Comments  Ocular Alignment normal   Ocular ROM normal   Spontaneous Nystagmus normal   Gaze-Holding Nystagmus normal   End-Gaze Nystagmus normal   Vergence (normal 2-3) not examined   Smooth Pursuit normal   Cross-Cover Test not examined    Saccades abnormal Multiple corrective saccades to reach target  VOR Cancellation normal   Left Head Impulse normal   Right Head Impulse normal   Static Acuity not examined   Dynamic Acuity not examined    Oculomotor Exam- Fixation Suppressed: Deferred  BPPV TESTS:  Symptoms Duration Intensity Nystagmus  L Dix-Hallpike ?dizziness   Possibly a few L torsional beats but not overly convincing  R Dix-Hallpike None   None  L Head Roll Dizziness 15s Mild 5-8 L torsional beats of nystagmus  R Head Roll None   None  L Sidelying Test Dizziness 5s Mild 3-5 downbeats noted  R Sidelying Test Vertigo 8-10s Moderate Vigorous upbeating R torsional nystagmus  (blank = not tested)  FUNCTIONAL OUTCOME MEASURES  Results Comments  BERG    DGI    FGA    TUG    5TSTS    6 Minute Walk Test    10 Meter Gait Speed    (blank = not tested)   TODAY'S TREATMENT   SUBJECTIVE: Pt reports that she is doing alright today. Some improvement in vertigo since the last session but continues with intermittent mild symptoms. Denies pain. No specific questions or concerns.    PAIN: Denies   Neuromuscular Re-education  Interval history obtained, discussed plan of care, education about BPPV and today's findings;   Canalith Repositioning Treatment Negative R Dix-Hallpike Test. Positive L Dix-Hallpike Test for upbeating faint L torsional nystagmus lasting 5-8s (10s latency) with concurrent vertigo of mild severity. Pt treated with 1 bout of Epley Maneuver for presumed L posterior canal BPPV. One minute holds in each position and retesting with Sidelying Test afterward. R Sidelying Test is positive for vigorous upbeating R torsional nystagmus lasting 3-5s with concurrent moderate vertigo. Performed two bouts of the R Liberatory Maneuver with 1 minute holds in each position with retesting (R Sidelying Test) between maneuvers which is negative for both vertigo and nystagmus.    PATIENT EDUCATION:  Education  details: Plan of care, BPPV Person educated: Patient Education method: Explanation, video, handout Education comprehension: verbalized understanding   HOME EXERCISE PROGRAM:  Access Code: [email or web address] URL: [email or web address] Date: 12/12/2023 Prepared by: Selinda Eck  Patient Education - BPPV  VEDA BPPV Handout   ASSESSMENT: CLINICAL IMPRESSION: Negative R Dix-Hallpike Test. Positive L Dix-Hallpike Test for upbeating faint L torsional nystagmus lasting 5-8s (10s latency) with concurrent vertigo of mild severity. Pt treated with 1 bout of Epley Maneuver for presumed L posterior canal BPPV. One minute holds in each position and retesting with Sidelying Test afterward. R Sidelying Test is positive for vigorous upbeating R torsional nystagmus lasting 3-5s with concurrent moderate vertigo. Performed two bouts of the R Liberatory Maneuver with 1 minute holds in each position with retesting (R Sidelying Test) between maneuvers which is negative for  both vertigo and nystagmus.  At next session will repeat BPPV testing and treat as appropriate. Once clear will complete additional vestibular testing as well as general balance screening as appropriate.  OBJECTIVE IMPAIRMENTS: dizziness.   ACTIVITY LIMITATIONS: bending, transfers, and bed mobility  PARTICIPATION LIMITATIONS: cleaning, laundry, and community activity  PERSONAL FACTORS: Past/current experiences, Time since onset of injury/illness/exacerbation, and 3+ comorbidities: OSA, HTN, and headaches are also affecting patient's functional outcome.   REHAB POTENTIAL: Excellent  CLINICAL DECISION MAKING: Evolving/moderate complexity  EVALUATION COMPLEXITY: Moderate   GOALS:  SHORT TERM GOALS: Target date: 01/09/2024  Pt will be independent with HEP for dizziness in order to decrease symptoms, improve balance,decrease fall risk, and improve function at home. Baseline: Goal status: INITIAL   LONG TERM GOALS: Target  date: 02/06/2024  Pt will decrease DHI score by at least 18 points in order to demonstrate clinically significant reduction in disability related to dizziness.  Baseline: 64/100; Goal status: INITIAL  2. Pt will report no further episode of vertigo with turning, rolling, bending, or looking upward in order to decrease her risk for falls and improve her function at home.  Baseline:  Goal status: INITIAL  3.  Pt will score >67% on the ABC in order to demonstrate clinically significant balance confidence above cut-off score;      Baseline: To be completed Goal status: INITIAL   PLAN: PT FREQUENCY: 1x/week  PT DURATION: 8 weeks  PLANNED INTERVENTIONS: Therapeutic exercises, Therapeutic activity, Neuromuscular re-education, Balance training, Gait training, Patient/Family education, Self Care, Joint mobilization, Joint manipulation, Vestibular training, Canalith repositioning, Orthotic/Fit training, DME instructions, Dry Needling, Electrical stimulation, Spinal manipulation, Spinal mobilization, Cryotherapy, Moist heat, Taping, Traction, Ultrasound, Ionotophoresis 4mg /ml Dexamethasone , Manual therapy, and Re-evaluation.  PLAN FOR NEXT SESSION: Retest for BPPV and perform canalith repositioning treatment. Once clear will perform fixation suppression vestibular/oculomotor testing as well as additional balance tests as needed;   Govanni Plemons D Othell Diluzio PT, DPT, GCS  Ashlee Everett, PT 12/27/2023, 11:43 AM

## 2024-01-03 ENCOUNTER — Ambulatory Visit: Attending: Family Medicine

## 2024-01-03 DIAGNOSIS — R42 Dizziness and giddiness: Secondary | ICD-10-CM | POA: Diagnosis present

## 2024-01-03 NOTE — Therapy (Signed)
 OUTPATIENT PHYSICAL THERAPY VESTIBULAR TREATMENT  Patient Name: Ashlee Everett MRN: [id number] DOB:[birth year], 53 y.o., female Today's Date: 01/03/2024  END OF SESSION:  PT End of Session - 01/03/24 1312     Visit Number 4    Number of Visits 9    Date for Recertification  02/06/24    Authorization Type eval: 12/12/23    PT Start Time 1315    PT Stop Time 1348    PT Time Calculation (min) 33 min    Activity Tolerance Patient tolerated treatment well    Behavior During Therapy [facility] for tasks assessed/performed         Past Medical History:  Diagnosis Date   Anginal pain 2017   Cancer (HCC) 2016   BASAL CELL   Diverticulitis    GERD (gastroesophageal reflux disease)    Headache    History of hiatal hernia    SMALL   Hypertension    IBS (irritable bowel syndrome)    Paroxysmal SVT (supraventricular tachycardia)    Sleep apnea    USES CPAP   Past Surgical History:  Procedure Laterality Date   ABDOMINAL HYSTERECTOMY     APPENDECTOMY     CHOLECYSTECTOMY N/A 06/21/2017   Procedure: LAPAROSCOPIC CHOLECYSTECTOMY;  Surgeon: Rodolph Romano, MD;  Location: [facility] ORS;  Service: General;  Laterality: N/A;   COLONOSCOPY N/A 12/24/2023   Procedure: COLONOSCOPY;  Surgeon: Maryruth Ole DASEN, MD;  Location: [facility] ENDOSCOPY;  Service: Endoscopy;  Laterality: N/A;   ESOPHAGOGASTRODUODENOSCOPY (EGD) WITH PROPOFOL  N/A 06/11/2017   Procedure: ESOPHAGOGASTRODUODENOSCOPY (EGD) WITH PROPOFOL ;  Surgeon: Gaylyn Gladis PENNER, MD;  Location: [facility]COPY;  Service: Endoscopy;  Laterality: N/A;   LAPAROSCOPIC LYSIS OF ADHESIONS     SALPINGOOPHORECTOMY Left    SIGMOID RESECTION / RECTOPEXY     TUBAL LIGATION     Patient Active Problem List   Diagnosis Date Noted   Chest pain 02/16/2015   Elevated troponin 02/16/2015   Hypertension 02/16/2015   Paroxysmal SVT (supraventricular tachycardia) 02/16/2015   PCP: Diedra Lame, MD  REFERRING PROVIDER: Diedra Lame, MD   REFERRING  DIAG: H81.10 (ICD-10-CM) - Benign paroxysmal vertigo, unspecified ear   RATIONALE FOR EVALUATION AND TREATMENT: Rehabilitation  THERAPY DIAG: Dizziness and giddiness  ONSET DATE: 11/30/23  FOLLOW-UP APPT SCHEDULED WITH REFERRING PROVIDER: No, nothing scheduled;  FROM INITIAL EVALUATION SUBJECTIVE:   Chief Complaint:  Dizziness  Pertinent History Pt arrives with complaints of dizziness which started two Fridays ago when she stood up from the couch. She laid back down on her L side and started having vertigo. Her dizziness persisted for a few hours and It has not let up a whole lot since then. Pt reports intermittent episodes of vertigo/dizziness/lightheadedness 2-3 times per day. Occasionally she feels like she is going to pass out/black out. Symptoms last for approximately 1 minute. She has a remote history of vertigo (approximately 5 years ago). At that time she went to the ER and was given meds and her symptoms resolved. She is taking meclizine  3 times per day and is now on a Prednisone taper as well (took Meclizine  this morning). She denies any improvement on the steroid taper. She continues to complain of aural fullness and occasional tinnitus bilaterally. MRI in the ER showed no acute intracranial changes but moderate to large bilateral mastoid effusions. PMH includes hypertension, IBS, recent L knee meniscus repair. No recent medication changes prior to symptom onset. No chest pain, ear pain, or shortness of breath.   11/30/23: Brain MRI: IMPRESSION:  1. No acute intracranial abnormality. 2. Few scattered subcentimeter foci of T2/FLAIR hyperintensities involving the supratentorial cerebral white matter, nonspecific, but most commonly related to chronic microvascular ischemic disease. Overall, changes are extremely mild for age. 3. Moderate to large bilateral mastoid effusions, of uncertain significance. Correlation with physical exam recommended.  Description of dizziness: vertigo,  dizziness, lightheadedness Frequency: 2-3x/day Duration: initially hours, now only 1 minutel Symptom nature: intermittent Progression of symptoms since onset: no change History of similar episodes: Yes, vertigo approximately 5 years ago  Provocative Factors:  laying down, supine to sit, standing up quickly; Easing Factors: waiting for symptoms to pass  Auditory complaints (tinnitus, pain, drainage, hearing loss, aural fullness): Yes aural fullness, tinnitus, hearing difficulty (worse on the R side), R ear pain, denies drainage; Vision changes (diplopia, visual field loss, recent changes, recent eye exam): No Chest pain/palpitations: No History of head injury/concussion: No Stress/anxiety: Yes, recently lost her job because of her L knee pain, recent passing of her mother; Migraines/headaches: Yes, none recently. Very remote history of migraines; Nausea/vomiting: Yes, nausea but no vomiting Numbness/tingling: No Focal weakness: No Dysarthria/dysphagia/drop attacks: No  Has patient fallen in last 6 months? No Pertinent pain: Yes, neck pain and bilateral shoulder stiffness Dominant hand: right Imaging: Yes, see history; Prior level of function: Independent Occupational demands: Not currently working Hobbies: reading  [employer]: Positive for basal cell carcinoma, Negative for chills/fever, night sweats, unexplained weight loss/gain  PRECAUTIONS: None  WEIGHT BEARING RESTRICTIONS No  LIVING ENVIRONMENT: Lives with: lives with their family Lives in: Mobile home, 8 steps to enter, bilateral narrow rails;  PATIENT GOALS Decrease symptoms   OBJECTIVE EXAMINATION  POSTURE: No gross deficits contributing to symptoms  NEUROLOGICAL SCREEN: (2+ unless otherwise noted.) N=normal  Ab=abnormal  Level Dermatome R L Myotome R L Reflex R L  C3 Anterior Neck N N Sidebend C2-3 N N Jaw CN V    C4 Top of Shoulder N N Shoulder Shrug C4 N N Hoffman's UMN    C5 Lateral Upper Arm N N Shoulder  ABD C4-5 N N Biceps C5-6    C6 Lateral Arm/ Thumb N N Arm Flex/ Wrist Ext C5-6 N N Brachiorad. C5-6    C7 Middle Finger N N Arm Ext//Wrist Flex C6-7 N N Triceps C7    C8 4th & 5th Finger N N Flex/ Ext Carpi Ulnaris C8 N N Patellar (L3-4)    T1 Medial Arm N N Interossei T1 N N Gastrocnemius    L2 Medial thigh/groin N N Illiopsoas (L2-3) N N     L3 Lower thigh/med.knee N N Quadriceps (L3-4) N N     L4 Medial leg/lat thigh N N Tibialis Ant (L4-5) N N     L5 Lat. leg & dorsal foot N N EHL (L5) N N     S1 post/lat foot/thigh/leg N N Gastrocnemius (S1-2) N N     S2 Post./med. thigh & leg N N Hamstrings (L4-S3) N N      CRANIAL NERVES II, III, IV, VI: Pupils equal and reactive to light, visual acuity and visual fields are intact, extraocular muscles are intact  V: Facial sensation is decreased on R side of forehead, otherwise symmetric bilaterally  VII: Facial strength is intact and symmetric bilaterally  VIII: Hearing is normal as tested by gross conversation IX, X: Palate elevates midline, normal phonation, uvula midline XI: Shoulder shrug strength is intact  XII: Tongue protrudes midline   COORDINATION Finger to Nose: Normal Heel to Shin: Normal  Pronator Drift: Negative Rapid Alternating Movements: Normal Finger to Thumb Opposition: Normal   RANGE OF MOTION Cervical Spine AROM is limited in all directions but especially extension. No functional focal deficits in AROM noted in BUE/BLE  MANUAL MUSCLE TESTING BUE/BLE strength WNL without focal deficits  TRANSFERS/GAIT Independent for transfers and ambulation without assistive device   PATIENT SURVEYS DHI: 64/100  OCULOMOTOR / VESTIBULAR TESTING  Oculomotor Exam- Room Light  Findings Comments  Ocular Alignment normal   Ocular ROM normal   Spontaneous Nystagmus normal   Gaze-Holding Nystagmus normal   End-Gaze Nystagmus normal   Vergence (normal 2-3) not examined   Smooth Pursuit normal   Cross-Cover Test not examined    Saccades abnormal Multiple corrective saccades to reach target  VOR Cancellation normal   Left Head Impulse normal   Right Head Impulse normal   Static Acuity not examined   Dynamic Acuity not examined    Oculomotor Exam- Fixation Suppressed: Deferred  BPPV TESTS:  Symptoms Duration Intensity Nystagmus  L Dix-Hallpike ?dizziness   Possibly a few L torsional beats but not overly convincing  R Dix-Hallpike None   None  L Head Roll Dizziness 15s Mild 5-8 L torsional beats of nystagmus  R Head Roll None   None  L Sidelying Test Dizziness 5s Mild 3-5 downbeats noted  R Sidelying Test Vertigo 8-10s Moderate Vigorous upbeating R torsional nystagmus  (blank = not tested)  FUNCTIONAL OUTCOME MEASURES  Results Comments  BERG    DGI    FGA    TUG    5TSTS    6 Minute Walk Test    10 Meter Gait Speed    (blank = not tested)   TODAY'S TREATMENT   SUBJECTIVE: Pt reports that she is doing alright today. No episodes of vertigo since the last therapy session until last night when she had a very brief and mild episode when rolling onto her R side. Denies resting pain upon arrival. No specific questions.   PAIN: Denies   Neuromuscular Re-education  Interval history obtained, discussed plan of care, education about BPPV and today's findings;   Canalith Repositioning Treatment  BPPV TESTS:  Symptoms Duration Intensity Nystagmus  L Dix-Hallpike None   None  R Dix-Hallpike None   None  L Head Roll None   None  R Head Roll None   None  L Sidelying Test None   None  R Sidelying Test None   None  (blank = not tested)  Negative BPPV testing in all positions for vertigo and nystagmus. Given prior positive R sided testing and episode when rolling onto R side last night, pt treated with 3 bouts of the Epley Maneuver for presumed R posterior canal BPPV. Two minute holds in each position and retesting between maneuvers. All retesting is negative for both vertigo and nystagmus.     PATIENT  EDUCATION:  Education details: Plan of care, BPPV Person educated: Patient Education method: Explanation, video, handout Education comprehension: verbalized understanding   HOME EXERCISE PROGRAM:  Access Code: [email or web address] URL: [email or web address] Date: 12/12/2023 Prepared by: Selinda Eck  Patient Education - BPPV  VEDA BPPV Handout   ASSESSMENT: CLINICAL IMPRESSION: Negative BPPV testing in all positions for vertigo and nystagmus. Given prior positive R sided testing and episode when rolling onto R side last night, pt treated with 3 bouts of the Epley Maneuver for presumed R posterior canal BPPV. Two minute holds in each position and retesting between maneuvers. All retesting is negative for both vertigo  and nystagmus. At next session will repeat BPPV testing and treat as appropriate. If she remains clear will complete additional vestibular testing as well as general balance screening as appropriate and then discharge.  OBJECTIVE IMPAIRMENTS: dizziness.   ACTIVITY LIMITATIONS: bending, transfers, and bed mobility  PARTICIPATION LIMITATIONS: cleaning, laundry, and community activity  PERSONAL FACTORS: Past/current experiences, Time since onset of injury/illness/exacerbation, and 3+ comorbidities: OSA, HTN, and headaches are also affecting patient's functional outcome.   REHAB POTENTIAL: Excellent  CLINICAL DECISION MAKING: Evolving/moderate complexity  EVALUATION COMPLEXITY: Moderate   GOALS:  SHORT TERM GOALS: Target date: 01/09/2024  Pt will be independent with HEP for dizziness in order to decrease symptoms, improve balance,decrease fall risk, and improve function at home. Baseline: Goal status: INITIAL   LONG TERM GOALS: Target date: 02/06/2024  Pt will decrease DHI score by at least 18 points in order to demonstrate clinically significant reduction in disability related to dizziness.  Baseline: 64/100; Goal status: INITIAL  2. Pt will report no  further episode of vertigo with turning, rolling, bending, or looking upward in order to decrease her risk for falls and improve her function at home.  Baseline:  Goal status: INITIAL  3.  Pt will score >67% on the ABC in order to demonstrate clinically significant balance confidence above cut-off score;      Baseline: To be completed Goal status: INITIAL   PLAN: PT FREQUENCY: 1x/week  PT DURATION: 8 weeks  PLANNED INTERVENTIONS: Therapeutic exercises, Therapeutic activity, Neuromuscular re-education, Balance training, Gait training, Patient/Family education, Self Care, Joint mobilization, Joint manipulation, Vestibular training, Canalith repositioning, Orthotic/Fit training, DME instructions, Dry Needling, Electrical stimulation, Spinal manipulation, Spinal mobilization, Cryotherapy, Moist heat, Taping, Traction, Ultrasound, Ionotophoresis 4mg /ml Dexamethasone , Manual therapy, and Re-evaluation.  PLAN FOR NEXT SESSION: Retest for BPPV and perform canalith repositioning treatment. Once clear will perform fixation suppression vestibular/oculomotor testing as well as additional balance tests as needed;   Corinthian Kemler D Leisl Spurrier PT, DPT, GCS  Ali Mohl, PT 01/03/2024, 1:52 PM

## 2024-01-10 ENCOUNTER — Ambulatory Visit

## 2024-01-10 DIAGNOSIS — R42 Dizziness and giddiness: Secondary | ICD-10-CM | POA: Diagnosis not present

## 2024-01-10 NOTE — Therapy (Signed)
 OUTPATIENT PHYSICAL THERAPY VESTIBULAR TREATMENT  Patient Name: Ashlee Everett MRN: [id number] DOB:01-Apr-1971, 53 y.o., female Today's Date: 01/10/2024  END OF SESSION:  PT End of Session - 01/10/24 1104     Visit Number 5    Number of Visits 9    Date for Recertification  02/06/24    Authorization Type eval: 12/12/23    PT Start Time 1102    PT Stop Time 1145    PT Time Calculation (min) 43 min    Activity Tolerance Patient tolerated treatment well    Behavior During Therapy [facility] for tasks assessed/performed          Past Medical History:  Diagnosis Date   Anginal pain 2017   Cancer (HCC) 2016   BASAL CELL   Diverticulitis    GERD (gastroesophageal reflux disease)    Headache    History of hiatal hernia    SMALL   Hypertension    IBS (irritable bowel syndrome)    Paroxysmal SVT (supraventricular tachycardia)    Sleep apnea    USES CPAP   Past Surgical History:  Procedure Laterality Date   ABDOMINAL HYSTERECTOMY     APPENDECTOMY     CHOLECYSTECTOMY N/A 06/21/2017   Procedure: LAPAROSCOPIC CHOLECYSTECTOMY;  Surgeon: Rodolph Romano, MD;  Location: [facility] ORS;  Service: General;  Laterality: N/A;   COLONOSCOPY N/A 12/24/2023   Procedure: COLONOSCOPY;  Surgeon: Maryruth Ole DASEN, MD;  Location: [facility] ENDOSCOPY;  Service: Endoscopy;  Laterality: N/A;   ESOPHAGOGASTRODUODENOSCOPY (EGD) WITH PROPOFOL  N/A 06/11/2017   Procedure: ESOPHAGOGASTRODUODENOSCOPY (EGD) WITH PROPOFOL ;  Surgeon: Gaylyn Gladis PENNER, MD;  Location: [facility] ENDOSCOPY;  Service: Endoscopy;  Laterality: N/A;   LAPAROSCOPIC LYSIS OF ADHESIONS     SALPINGOOPHORECTOMY Left    SIGMOID RESECTION / RECTOPEXY     TUBAL LIGATION     Patient Active Problem List   Diagnosis Date Noted   Chest pain 02/16/2015   Elevated troponin 02/16/2015   Hypertension 02/16/2015   Paroxysmal SVT (supraventricular tachycardia) 02/16/2015   PCP: Diedra Lame, MD  REFERRING PROVIDER: Diedra Lame, MD   REFERRING  DIAG: H81.10 (ICD-10-CM) - Benign paroxysmal vertigo, unspecified ear   RATIONALE FOR EVALUATION AND TREATMENT: Rehabilitation  THERAPY DIAG: Dizziness and giddiness  ONSET DATE: 11/30/23  FOLLOW-UP APPT SCHEDULED WITH REFERRING PROVIDER: No, nothing scheduled;  FROM INITIAL EVALUATION SUBJECTIVE:   Chief Complaint:  Dizziness  Pertinent History Pt arrives with complaints of dizziness which started two Fridays ago when she stood up from the couch. She laid back down on her L side and started having vertigo. Her dizziness persisted for a few hours and It has not let up a whole lot since then. Pt reports intermittent episodes of vertigo/dizziness/lightheadedness 2-3 times per day. Occasionally she feels like she is going to pass out/black out. Symptoms last for approximately 1 minute. She has a remote history of vertigo (approximately 5 years ago). At that time she went to the ER and was given meds and her symptoms resolved. She is taking meclizine  3 times per day and is now on a Prednisone taper as well (took Meclizine  this morning). She denies any improvement on the steroid taper. She continues to complain of aural fullness and occasional tinnitus bilaterally. MRI in the ER showed no acute intracranial changes but moderate to large bilateral mastoid effusions. PMH includes hypertension, IBS, recent L knee meniscus repair. No recent medication changes prior to symptom onset. No chest pain, ear pain, or shortness of breath.   11/30/23: Brain MRI:  IMPRESSION: 1. No acute intracranial abnormality. 2. Few scattered subcentimeter foci of T2/FLAIR hyperintensities involving the supratentorial cerebral white matter, nonspecific, but most commonly related to chronic microvascular ischemic disease. Overall, changes are extremely mild for age. 3. Moderate to large bilateral mastoid effusions, of uncertain significance. Correlation with physical exam recommended.  Description of dizziness: vertigo,  dizziness, lightheadedness Frequency: 2-3x/day Duration: initially hours, now only 1 minutel Symptom nature: intermittent Progression of symptoms since onset: no change History of similar episodes: Yes, vertigo approximately 5 years ago  Provocative Factors:  laying down, supine to sit, standing up quickly; Easing Factors: waiting for symptoms to pass  Auditory complaints (tinnitus, pain, drainage, hearing loss, aural fullness): Yes aural fullness, tinnitus, hearing difficulty (worse on the R side), R ear pain, denies drainage; Vision changes (diplopia, visual field loss, recent changes, recent eye exam): No Chest pain/palpitations: No History of head injury/concussion: No Stress/anxiety: Yes, recently lost her job because of her L knee pain, recent passing of her mother; Migraines/headaches: Yes, none recently. Very remote history of migraines; Nausea/vomiting: Yes, nausea but no vomiting Numbness/tingling: No Focal weakness: No Dysarthria/dysphagia/drop attacks: No  Has patient fallen in last 6 months? No Pertinent pain: Yes, neck pain and bilateral shoulder stiffness Dominant hand: right Imaging: Yes, see history; Prior level of function: Independent Occupational demands: Not currently working Hobbies: reading  [employer]: Positive for basal cell carcinoma, Negative for chills/fever, night sweats, unexplained weight loss/gain  PRECAUTIONS: None  WEIGHT BEARING RESTRICTIONS No  LIVING ENVIRONMENT: Lives with: lives with their family Lives in: Mobile home, 8 steps to enter, bilateral narrow rails;  PATIENT GOALS Decrease symptoms   OBJECTIVE EXAMINATION  POSTURE: No gross deficits contributing to symptoms  NEUROLOGICAL SCREEN: (2+ unless otherwise noted.) N=normal  Ab=abnormal  Level Dermatome R L Myotome R L Reflex R L  C3 Anterior Neck N N Sidebend C2-3 N N Jaw CN V    C4 Top of Shoulder N N Shoulder Shrug C4 N N Hoffman's UMN    C5 Lateral Upper Arm N N Shoulder  ABD C4-5 N N Biceps C5-6    C6 Lateral Arm/ Thumb N N Arm Flex/ Wrist Ext C5-6 N N Brachiorad. C5-6    C7 Middle Finger N N Arm Ext//Wrist Flex C6-7 N N Triceps C7    C8 4th & 5th Finger N N Flex/ Ext Carpi Ulnaris C8 N N Patellar (L3-4)    T1 Medial Arm N N Interossei T1 N N Gastrocnemius    L2 Medial thigh/groin N N Illiopsoas (L2-3) N N     L3 Lower thigh/med.knee N N Quadriceps (L3-4) N N     L4 Medial leg/lat thigh N N Tibialis Ant (L4-5) N N     L5 Lat. leg & dorsal foot N N EHL (L5) N N     S1 post/lat foot/thigh/leg N N Gastrocnemius (S1-2) N N     S2 Post./med. thigh & leg N N Hamstrings (L4-S3) N N      CRANIAL NERVES II, III, IV, VI: Pupils equal and reactive to light, visual acuity and visual fields are intact, extraocular muscles are intact  V: Facial sensation is decreased on R side of forehead, otherwise symmetric bilaterally  VII: Facial strength is intact and symmetric bilaterally  VIII: Hearing is normal as tested by gross conversation IX, X: Palate elevates midline, normal phonation, uvula midline XI: Shoulder shrug strength is intact  XII: Tongue protrudes midline   COORDINATION Finger to Nose: Normal Heel to Shin:  Normal Pronator Drift: Negative Rapid Alternating Movements: Normal Finger to Thumb Opposition: Normal   RANGE OF MOTION Cervical Spine AROM is limited in all directions but especially extension. No functional focal deficits in AROM noted in BUE/BLE  MANUAL MUSCLE TESTING BUE/BLE strength WNL without focal deficits  TRANSFERS/GAIT Independent for transfers and ambulation without assistive device   PATIENT SURVEYS DHI: 64/100  OCULOMOTOR / VESTIBULAR TESTING  Oculomotor Exam- Room Light  Findings Comments  Ocular Alignment normal   Ocular ROM normal   Spontaneous Nystagmus normal   Gaze-Holding Nystagmus normal   End-Gaze Nystagmus normal   Vergence (normal 2-3) not examined   Smooth Pursuit normal   Cross-Cover Test not examined    Saccades abnormal Multiple corrective saccades to reach target  VOR Cancellation normal   Left Head Impulse normal   Right Head Impulse normal   Static Acuity not examined   Dynamic Acuity not examined    Oculomotor Exam- Fixation Suppressed: Deferred  BPPV TESTS:  Symptoms Duration Intensity Nystagmus  L Dix-Hallpike ?dizziness   Possibly a few L torsional beats but not overly convincing  R Dix-Hallpike None   None  L Head Roll Dizziness 15s Mild 5-8 L torsional beats of nystagmus  R Head Roll None   None  L Sidelying Test Dizziness 5s Mild 3-5 downbeats noted  R Sidelying Test Vertigo 8-10s Moderate Vigorous upbeating R torsional nystagmus  (blank = not tested)  FUNCTIONAL OUTCOME MEASURES  Results Comments  BERG    DGI    FGA    TUG    5TSTS    6 Minute Walk Test    10 Meter Gait Speed    (blank = not tested)   TODAY'S TREATMENT   SUBJECTIVE: Pt reports that she is doing well today. She reports at least 95% improvement in symptoms since starting with therapy. Slight dizziness the day of the last therapy session. However no further episodes of vertigo over the last week. Denies resting pain upon arrival. No specific questions.   PAIN: Denies   Neuromuscular Re-education  Interval history obtained, discussed plan of care, education about BPPV and today's findings;   Canalith Repositioning Treatment  BPPV TESTS:  Symptoms Duration Intensity Nystagmus  L Dix-Hallpike Mild dizziness 5s  Very faint 6-8 beats of slow upbeating L torsional nystagmus  R Dix-Hallpike (initial) None   None  L Head Roll None   None  R Head Roll None   None  L Sidelying Test None   None  R Sidelying Test Vertigo 5s  Upbeating R torsional nystagmus.  (blank = not tested)   Canalith Repositioning Treatment Negative R Dix-Hallpike Test. Positive L Dix-Hallpike Test for upbeating faint L torsional nystagmus lasting 5s with very mild dizzines. Pt treated with 1 bout of Epley Maneuver for  presumed L posterior canal BPPV. One minute holds in each position and retesting with Sidelying Test afterward. R Sidelying Test is positive for vigorous upbeating R torsional nystagmus lasting 5s with concurrent moderate vertigo. Performed two bouts of the R Epley Maneuver (R Hallpike position now positive for nystagmus and vertigo during first maneuver) with 2 minute holds in each position and vibration on mastoid. Retesting between maneuvers is negative for both vertigo and nystagmus.     PATIENT EDUCATION:  Education details: Plan of care, BPPV Person educated: Patient Education method: Explanation, video, handout Education comprehension: verbalized understanding   HOME EXERCISE PROGRAM:  Access Code: [email or web address] URL: [email or web address] Date: 12/12/2023 Prepared by: Selinda Eck  Patient Education -  BPPV  VEDA BPPV Handout   ASSESSMENT: CLINICAL IMPRESSION: Negative R Dix-Hallpike Test. Positive L Dix-Hallpike Test for upbeating faint L torsional nystagmus lasting 5s with very mild dizzines. Pt treated with 1 bout of Epley Maneuver for presumed L posterior canal BPPV. One minute holds in each position and retesting with Sidelying Test afterward. R Sidelying Test is positive for vigorous upbeating R torsional nystagmus lasting 5s with concurrent moderate vertigo. Performed two bouts of the R Epley Maneuver (R Hallpike position now positive for nystagmus and vertigo during first maneuver) with 2 minute holds in each position and vibration on mastoid. Retesting between maneuvers is negative for both vertigo and nystagmus.  At next session will repeat BPPV testing and treat as appropriate. If she remains clear will complete additional vestibular testing as well as general balance screening as appropriate and then discharge.  OBJECTIVE IMPAIRMENTS: dizziness.   ACTIVITY LIMITATIONS: bending, transfers, and bed mobility  PARTICIPATION LIMITATIONS: cleaning, laundry, and  community activity  PERSONAL FACTORS: Past/current experiences, Time since onset of injury/illness/exacerbation, and 3+ comorbidities: OSA, HTN, and headaches are also affecting patient's functional outcome.   REHAB POTENTIAL: Excellent  CLINICAL DECISION MAKING: Evolving/moderate complexity  EVALUATION COMPLEXITY: Moderate   GOALS:  SHORT TERM GOALS: Target date: 01/09/2024  Pt will be independent with HEP for dizziness in order to decrease symptoms, improve balance,decrease fall risk, and improve function at home. Baseline: Goal status: INITIAL   LONG TERM GOALS: Target date: 02/06/2024  Pt will decrease DHI score by at least 18 points in order to demonstrate clinically significant reduction in disability related to dizziness.  Baseline: 64/100; Goal status: INITIAL  2. Pt will report no further episode of vertigo with turning, rolling, bending, or looking upward in order to decrease her risk for falls and improve her function at home.  Baseline:  Goal status: INITIAL  3.  Pt will score >67% on the ABC in order to demonstrate clinically significant balance confidence above cut-off score;      Baseline: To be completed Goal status: INITIAL   PLAN: PT FREQUENCY: 1x/week  PT DURATION: 8 weeks  PLANNED INTERVENTIONS: Therapeutic exercises, Therapeutic activity, Neuromuscular re-education, Balance training, Gait training, Patient/Family education, Self Care, Joint mobilization, Joint manipulation, Vestibular training, Canalith repositioning, Orthotic/Fit training, DME instructions, Dry Needling, Electrical stimulation, Spinal manipulation, Spinal mobilization, Cryotherapy, Moist heat, Taping, Traction, Ultrasound, Ionotophoresis 4mg /ml Dexamethasone , Manual therapy, and Re-evaluation.  PLAN FOR NEXT SESSION: Retest for BPPV and perform canalith repositioning treatment. Once clear will perform fixation suppression vestibular/oculomotor testing as well as additional balance tests  as needed;   Orby Tangen D Thoams Siefert PT, DPT, GCS  Cannon Arreola, PT 01/10/2024, 11:47 AM

## 2024-01-14 NOTE — Therapy (Signed)
 OUTPATIENT PHYSICAL THERAPY VESTIBULAR TREATMENT  Patient Name: Ashlee Everett MRN: [id number] DOB:July 23, 1970, 53 y.o., female Today's Date: 01/17/2024  END OF SESSION:  PT End of Session - 01/17/24 1102     Visit Number 6    Number of Visits 9    Date for Recertification  02/06/24    Authorization Type eval: 12/12/23    PT Start Time 1105    PT Stop Time 1123    PT Time Calculation (min) 18 min    Activity Tolerance Patient tolerated treatment well    Behavior During Therapy [facility] for tasks assessed/performed         Past Medical History:  Diagnosis Date   Anginal pain 2017   Cancer (HCC) 2016   BASAL CELL   Diverticulitis    GERD (gastroesophageal reflux disease)    Headache    History of hiatal hernia    SMALL   Hypertension    IBS (irritable bowel syndrome)    Paroxysmal SVT (supraventricular tachycardia)    Sleep apnea    USES CPAP   Past Surgical History:  Procedure Laterality Date   ABDOMINAL HYSTERECTOMY     APPENDECTOMY     CHOLECYSTECTOMY N/A 06/21/2017   Procedure: LAPAROSCOPIC CHOLECYSTECTOMY;  Surgeon: Rodolph Romano, MD;  Location: [facility] ORS;  Service: General;  Laterality: N/A;   COLONOSCOPY N/A 12/24/2023   Procedure: COLONOSCOPY;  Surgeon: Maryruth Ole DASEN, MD;  Location: [facility] ENDOSCOPY;  Service: Endoscopy;  Laterality: N/A;   ESOPHAGOGASTRODUODENOSCOPY (EGD) WITH PROPOFOL  N/A 06/11/2017   Procedure: ESOPHAGOGASTRODUODENOSCOPY (EGD) WITH PROPOFOL ;  Surgeon: Gaylyn Gladis PENNER, MD;  Location: [facility] ENDOSCOPY;  Service: Endoscopy;  Laterality: N/A;   LAPAROSCOPIC LYSIS OF ADHESIONS     SALPINGOOPHORECTOMY Left    SIGMOID RESECTION / RECTOPEXY     TUBAL LIGATION     Patient Active Problem List   Diagnosis Date Noted   Chest pain 02/16/2015   Elevated troponin 02/16/2015   Hypertension 02/16/2015   Paroxysmal SVT (supraventricular tachycardia) 02/16/2015   PCP: Diedra Lame, MD  REFERRING PROVIDER: Diedra Lame, MD   REFERRING  DIAG: H81.10 (ICD-10-CM) - Benign paroxysmal vertigo, unspecified ear   RATIONALE FOR EVALUATION AND TREATMENT: Rehabilitation  THERAPY DIAG: Dizziness and giddiness  ONSET DATE: 11/30/23  FOLLOW-UP APPT SCHEDULED WITH REFERRING PROVIDER: No, nothing scheduled;  FROM INITIAL EVALUATION SUBJECTIVE:   Chief Complaint:  Dizziness  Pertinent History Pt arrives with complaints of dizziness which started two Fridays ago when she stood up from the couch. She laid back down on her L side and started having vertigo. Her dizziness persisted for a few hours and It has not let up a whole lot since then. Pt reports intermittent episodes of vertigo/dizziness/lightheadedness 2-3 times per day. Occasionally she feels like she is going to pass out/black out. Symptoms last for approximately 1 minute. She has a remote history of vertigo (approximately 5 years ago). At that time she went to the ER and was given meds and her symptoms resolved. She is taking meclizine  3 times per day and is now on a Prednisone taper as well (took Meclizine  this morning). She denies any improvement on the steroid taper. She continues to complain of aural fullness and occasional tinnitus bilaterally. MRI in the ER showed no acute intracranial changes but moderate to large bilateral mastoid effusions. PMH includes hypertension, IBS, recent L knee meniscus repair. No recent medication changes prior to symptom onset. No chest pain, ear pain, or shortness of breath.   11/30/23: Brain MRI: IMPRESSION:  1. No acute intracranial abnormality. 2. Few scattered subcentimeter foci of T2/FLAIR hyperintensities involving the supratentorial cerebral white matter, nonspecific, but most commonly related to chronic microvascular ischemic disease. Overall, changes are extremely mild for age. 3. Moderate to large bilateral mastoid effusions, of uncertain significance. Correlation with physical exam recommended.  Description of dizziness: vertigo,  dizziness, lightheadedness Frequency: 2-3x/day Duration: initially hours, now only 1 minutel Symptom nature: intermittent Progression of symptoms since onset: no change History of similar episodes: Yes, vertigo approximately 5 years ago  Provocative Factors:  laying down, supine to sit, standing up quickly; Easing Factors: waiting for symptoms to pass  Auditory complaints (tinnitus, pain, drainage, hearing loss, aural fullness): Yes aural fullness, tinnitus, hearing difficulty (worse on the R side), R ear pain, denies drainage; Vision changes (diplopia, visual field loss, recent changes, recent eye exam): No Chest pain/palpitations: No History of head injury/concussion: No Stress/anxiety: Yes, recently lost her job because of her L knee pain, recent passing of her mother; Migraines/headaches: Yes, none recently. Very remote history of migraines; Nausea/vomiting: Yes, nausea but no vomiting Numbness/tingling: No Focal weakness: No Dysarthria/dysphagia/drop attacks: No  Has patient fallen in last 6 months? No Pertinent pain: Yes, neck pain and bilateral shoulder stiffness Dominant hand: right Imaging: Yes, see history; Prior level of function: Independent Occupational demands: Not currently working Hobbies: reading  [employer]: Positive for basal cell carcinoma, Negative for chills/fever, night sweats, unexplained weight loss/gain  PRECAUTIONS: None  WEIGHT BEARING RESTRICTIONS No  LIVING ENVIRONMENT: Lives with: lives with their family Lives in: Mobile home, 8 steps to enter, bilateral narrow rails;  PATIENT GOALS Decrease symptoms   OBJECTIVE EXAMINATION  POSTURE: No gross deficits contributing to symptoms  NEUROLOGICAL SCREEN: (2+ unless otherwise noted.) N=normal  Ab=abnormal  Level Dermatome R L Myotome R L Reflex R L  C3 Anterior Neck N N Sidebend C2-3 N N Jaw CN V    C4 Top of Shoulder N N Shoulder Shrug C4 N N Hoffman's UMN    C5 Lateral Upper Arm N N Shoulder  ABD C4-5 N N Biceps C5-6    C6 Lateral Arm/ Thumb N N Arm Flex/ Wrist Ext C5-6 N N Brachiorad. C5-6    C7 Middle Finger N N Arm Ext//Wrist Flex C6-7 N N Triceps C7    C8 4th & 5th Finger N N Flex/ Ext Carpi Ulnaris C8 N N Patellar (L3-4)    T1 Medial Arm N N Interossei T1 N N Gastrocnemius    L2 Medial thigh/groin N N Illiopsoas (L2-3) N N     L3 Lower thigh/med.knee N N Quadriceps (L3-4) N N     L4 Medial leg/lat thigh N N Tibialis Ant (L4-5) N N     L5 Lat. leg & dorsal foot N N EHL (L5) N N     S1 post/lat foot/thigh/leg N N Gastrocnemius (S1-2) N N     S2 Post./med. thigh & leg N N Hamstrings (L4-S3) N N      CRANIAL NERVES II, III, IV, VI: Pupils equal and reactive to light, visual acuity and visual fields are intact, extraocular muscles are intact  V: Facial sensation is decreased on R side of forehead, otherwise symmetric bilaterally  VII: Facial strength is intact and symmetric bilaterally  VIII: Hearing is normal as tested by gross conversation IX, X: Palate elevates midline, normal phonation, uvula midline XI: Shoulder shrug strength is intact  XII: Tongue protrudes midline   COORDINATION Finger to Nose: Normal Heel to Shin: Normal  Pronator Drift: Negative Rapid Alternating Movements: Normal Finger to Thumb Opposition: Normal   RANGE OF MOTION Cervical Spine AROM is limited in all directions but especially extension. No functional focal deficits in AROM noted in BUE/BLE  MANUAL MUSCLE TESTING BUE/BLE strength WNL without focal deficits  TRANSFERS/GAIT Independent for transfers and ambulation without assistive device   PATIENT SURVEYS DHI: 64/100  OCULOMOTOR / VESTIBULAR TESTING  Oculomotor Exam- Room Light  Findings Comments  Ocular Alignment normal   Ocular ROM normal   Spontaneous Nystagmus normal   Gaze-Holding Nystagmus normal   End-Gaze Nystagmus normal   Vergence (normal 2-3) not examined   Smooth Pursuit normal   Cross-Cover Test not examined    Saccades abnormal Multiple corrective saccades to reach target  VOR Cancellation normal   Left Head Impulse normal   Right Head Impulse normal   Static Acuity not examined   Dynamic Acuity not examined    Oculomotor Exam- Fixation Suppressed: Deferred  BPPV TESTS:  Symptoms Duration Intensity Nystagmus  L Dix-Hallpike ?dizziness   Possibly a few L torsional beats but not overly convincing  R Dix-Hallpike None   None  L Head Roll Dizziness 15s Mild 5-8 L torsional beats of nystagmus  R Head Roll None   None  L Sidelying Test Dizziness 5s Mild 3-5 downbeats noted  R Sidelying Test Vertigo 8-10s Moderate Vigorous upbeating R torsional nystagmus  (blank = not tested)  FUNCTIONAL OUTCOME MEASURES  Results Comments  BERG    DGI    FGA    TUG    5TSTS    6 Minute Walk Test    10 Meter Gait Speed    (blank = not tested)   TODAY'S TREATMENT   SUBJECTIVE: Pt reports that she is doing well today. No further episodes of vertigo since the last therapy session. I feel as well as I have in a long time. She saw Dr. Marchia who started her on an oral steroid taper due to ongoing knee inflammation. No specific questions.   PAIN: Denies   Neuromuscular Re-education  Interval history obtained, discussed plan of care and discharge, updated HEP provided, education about BPPV and home management;  BPPV TESTS:  Symptoms Duration Intensity Nystagmus  L Dix-Hallpike None   None  R Dix-Hallpike (initial) None   None  L Head Roll None   None  R Head Roll None   None  L Sidelying Test None   None  R Sidelying Test None   None  (blank = not tested)    PATIENT EDUCATION:  Education details: Discharge and updated HEP Person educated: Patient Education method: Explanation, video, handout Education comprehension: verbalized understanding   HOME EXERCISE PROGRAM:  Access Code: [email or web address] URL: [email or web address] Date: 01/17/2024 Prepared by: Selinda Eck  Exercises - Self-Epley Maneuver Right Ear  - 1 x daily - 7 x weekly - 60s in each position hold  Patient Education - BPPV  VEDA BPPV Handout   ASSESSMENT: CLINICAL IMPRESSION: All BPPV testing is negative on this date for both vertigo and nystagmus. Pt reports, I feel as well as I have in a long time. Provided education about self-epley maneuver and home management. Pt encouraged to follow-up if symptoms recur but she is ready for discharge at this time.   OBJECTIVE IMPAIRMENTS: dizziness.   ACTIVITY LIMITATIONS: bending, transfers, and bed mobility  PARTICIPATION LIMITATIONS: cleaning, laundry, and community activity  PERSONAL FACTORS: Past/current experiences, Time since onset of injury/illness/exacerbation, and 3+ comorbidities: OSA, HTN, and headaches  are also affecting patient's functional outcome.   REHAB POTENTIAL: Excellent  CLINICAL DECISION MAKING: Evolving/moderate complexity  EVALUATION COMPLEXITY: Moderate   GOALS:  SHORT TERM GOALS: Target date: 01/09/2024  Pt will be independent with HEP for dizziness in order to decrease symptoms, improve balance,decrease fall risk, and improve function at home. Baseline: Goal status: MET   LONG TERM GOALS: Target date: 02/06/2024  Pt will decrease DHI score by at least 18 points in order to demonstrate clinically significant reduction in disability related to dizziness.  Baseline: 64/100; 01/17/24: 0/100; Goal status: MET  2. Pt will report no further episode of vertigo with turning, rolling, bending, or looking upward in order to decrease her risk for falls and improve her function at home.  Baseline: 01/17/24: No further episodes; Goal status: MET  3.  Pt will score >67% on the ABC in order to demonstrate clinically significant balance confidence above cut-off score;      Baseline:  Goal status: DISCONTINUED   PLAN: PT FREQUENCY: 1x/week  PT DURATION: 8 weeks  PLANNED INTERVENTIONS: Therapeutic  exercises, Therapeutic activity, Neuromuscular re-education, Balance training, Gait training, Patient/Family education, Self Care, Joint mobilization, Joint manipulation, Vestibular training, Canalith repositioning, Orthotic/Fit training, DME instructions, Dry Needling, Electrical stimulation, Spinal manipulation, Spinal mobilization, Cryotherapy, Moist heat, Taping, Traction, Ultrasound, Ionotophoresis 4mg /ml Dexamethasone , Manual therapy, and Re-evaluation.  PLAN FOR NEXT SESSION: Discharge   Selinda JONETTA Eck PT, DPT, GCS  Mckenize Mezera, PT 01/17/2024, 11:39 AM

## 2024-01-17 ENCOUNTER — Ambulatory Visit

## 2024-01-17 DIAGNOSIS — R42 Dizziness and giddiness: Secondary | ICD-10-CM | POA: Diagnosis not present

## 2024-01-24 ENCOUNTER — Encounter

## 2024-01-31 ENCOUNTER — Encounter
